# Patient Record
Sex: Female | Born: 1995 | Race: White | Hispanic: No | Smoking: Former smoker
Health system: Southern US, Community
[De-identification: ages and names within clinical notes are randomized; demographics above are authoritative.]

## PROBLEM LIST (undated history)

## (undated) DIAGNOSIS — O24419 Gestational diabetes mellitus in pregnancy, unspecified control: Secondary | ICD-10-CM

## (undated) DIAGNOSIS — R569 Unspecified convulsions: Secondary | ICD-10-CM

## (undated) DIAGNOSIS — N159 Renal tubulo-interstitial disease, unspecified: Secondary | ICD-10-CM

## (undated) DIAGNOSIS — E119 Type 2 diabetes mellitus without complications: Secondary | ICD-10-CM

## (undated) DIAGNOSIS — Z8669 Personal history of other diseases of the nervous system and sense organs: Secondary | ICD-10-CM

## (undated) DIAGNOSIS — I10 Essential (primary) hypertension: Secondary | ICD-10-CM

## (undated) DIAGNOSIS — F419 Anxiety disorder, unspecified: Secondary | ICD-10-CM

## (undated) DIAGNOSIS — F32A Depression, unspecified: Secondary | ICD-10-CM

## (undated) DIAGNOSIS — F329 Major depressive disorder, single episode, unspecified: Secondary | ICD-10-CM

## (undated) HISTORY — PX: MOUTH SURGERY: SHX715

## (undated) HISTORY — PX: MOUTH BIOPSY: SHX1012

## (undated) HISTORY — DX: Personal history of other diseases of the nervous system and sense organs: Z86.69

## (undated) HISTORY — PX: WISDOM TOOTH EXTRACTION: SHX21

---
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
## 2015-01-29 ENCOUNTER — Emergency Department
Admission: EM | Admit: 2015-01-29 | Discharge: 2015-01-29 | Disposition: A | Discharge status: Home / Self Care | Payer: Medicaid Other | Attending: Emergency Medicine | Admitting: Emergency Medicine

## 2015-01-29 ENCOUNTER — Encounter: Payer: Self-pay | Admitting: Urgent Care

## 2015-01-29 DIAGNOSIS — G40909 Epilepsy, unspecified, not intractable, without status epilepticus: Secondary | ICD-10-CM | POA: Diagnosis not present

## 2015-01-29 DIAGNOSIS — N390 Urinary tract infection, site not specified: Secondary | ICD-10-CM

## 2015-01-29 DIAGNOSIS — Z3202 Encounter for pregnancy test, result negative: Secondary | ICD-10-CM | POA: Insufficient documentation

## 2015-01-29 DIAGNOSIS — I10 Essential (primary) hypertension: Secondary | ICD-10-CM | POA: Insufficient documentation

## 2015-01-29 DIAGNOSIS — E876 Hypokalemia: Secondary | ICD-10-CM

## 2015-01-29 DIAGNOSIS — Z720 Tobacco use: Secondary | ICD-10-CM | POA: Insufficient documentation

## 2015-01-29 DIAGNOSIS — R569 Unspecified convulsions: Secondary | ICD-10-CM | POA: Diagnosis present

## 2015-01-29 HISTORY — DX: Essential (primary) hypertension: I10

## 2015-01-29 HISTORY — DX: Unspecified convulsions: R56.9

## 2015-01-29 LAB — HEPATIC FUNCTION PANEL
ALBUMIN: 3.7 g/dL (ref 3.5–5.0)
ALT: 44 U/L (ref 14–54)
AST: 33 U/L (ref 15–41)
Alkaline Phosphatase: 95 U/L (ref 38–126)
BILIRUBIN TOTAL: 0.3 mg/dL (ref 0.3–1.2)
Total Protein: 7.2 g/dL (ref 6.5–8.1)

## 2015-01-29 LAB — BASIC METABOLIC PANEL
Anion gap: 8 (ref 5–15)
BUN: 10 mg/dL (ref 6–20)
CO2: 23 mmol/L (ref 22–32)
Calcium: 9 mg/dL (ref 8.9–10.3)
Chloride: 107 mmol/L (ref 101–111)
Creatinine, Ser: 0.7 mg/dL (ref 0.44–1.00)
GFR calc Af Amer: >60 mL/min (ref >60)
GFR calc non Af Amer: >60 mL/min (ref >60)
Glucose, Bld: 109 mg/dL — ABNORMAL HIGH (ref 65–99)
Potassium: 3 mmol/L — ABNORMAL LOW (ref 3.5–5.1)
Sodium: 138 mmol/L (ref 135–145)

## 2015-01-29 LAB — CBC
HCT: 34.8 % — ABNORMAL LOW (ref 35.0–47.0)
HEMOGLOBIN: 11.2 g/dL — AB (ref 12.0–16.0)
MCH: 26.2 pg (ref 26.0–34.0)
MCHC: 32.2 g/dL (ref 32.0–36.0)
MCV: 81.6 fL (ref 80.0–100.0)
Platelets: 302 10*3/uL (ref 150–440)
RBC: 4.27 MIL/uL (ref 3.80–5.20)
RDW: 15.1 % — AB (ref 11.5–14.5)
WBC: 9.8 10*3/uL (ref 3.6–11.0)

## 2015-01-29 LAB — URINALYSIS COMPLETE WITH MICROSCOPIC (ARMC ONLY)
BILIRUBIN URINE: NEGATIVE
Glucose, UA: NEGATIVE mg/dL
Ketones, ur: NEGATIVE mg/dL
Nitrite: NEGATIVE
Protein, ur: NEGATIVE mg/dL
Specific Gravity, Urine: 1.015 (ref 1.005–1.030)
pH: 7 (ref 5.0–8.0)

## 2015-01-29 LAB — POCT PREGNANCY, URINE: Preg Test, Ur: NEGATIVE

## 2015-01-29 MED ORDER — POTASSIUM CHLORIDE CRYS ER 20 MEQ PO TBCR
EXTENDED_RELEASE_TABLET | ORAL | Status: AC
  Administered 2015-01-29: 40 meq via ORAL
  Filled 2015-01-29: qty 2

## 2015-01-29 MED ORDER — SULFAMETHOXAZOLE-TRIMETHOPRIM 800-160 MG PO TABS
1.0000 | ORAL_TABLET | Freq: Once | ORAL | Status: AC
  Administered 2015-01-29: 1 via ORAL

## 2015-01-29 MED ORDER — POTASSIUM CHLORIDE CRYS ER 20 MEQ PO TBCR
40.0000 meq | EXTENDED_RELEASE_TABLET | Freq: Once | ORAL | Status: AC
  Administered 2015-01-29: 40 meq via ORAL

## 2015-01-29 MED ORDER — SULFAMETHOXAZOLE-TRIMETHOPRIM 800-160 MG PO TABS
ORAL_TABLET | ORAL | Status: AC
  Administered 2015-01-29: 1 via ORAL
  Filled 2015-01-29: qty 1

## 2015-01-29 MED ORDER — SULFAMETHOXAZOLE-TRIMETHOPRIM 800-160 MG PO TABS: 1.0000 | ORAL_TABLET | Freq: Two times a day (BID) | ORAL | Status: DC

## 2015-01-29 NOTE — ED Notes (Signed)
Patient resting in room with NAD noted. Awaiting results at this time. Updated on plan of care as it stands at this point. No verbalized needs. Will continue to monitor.

## 2015-01-29 NOTE — ED Notes (Addendum)
Patient presents via EMS from home. Patient's boyfriend was awakened to patient having a seizure; EMS could not describe activity, patient could not remember, and boyfriend is not available for consult. Patient has had seizures in the past, however the last one was when she was [age] - had Dx made age the age of 89.  FD reported to EMS that patient was post ictal upon their arrival, however patient CAO x 3 en route to the hospital. Patient reports feeling "numb" all over and that she has generalized back pain. CBG 105.

## 2015-01-29 NOTE — ED Provider Notes (Signed)
[facility] Emergency Department Provider Note ____________________________________________  Time seen: Approximately 1:30 AM  I have reviewed the triage vital signs and the nursing notes.  HISTORY  Chief Complaint Seizures  HPI Lori Henry is a 19 y.o. female apparently had a seizure while she was in her bed. The brother sleep beside her stated that she was having tonic-clonic movements, and was very combative after the seizure. Patient is not complaining of any significant injuries because she was in the bed. Patient does though states she has a mild headache and some pain in her mid back but that has been fairly chronic, but she did not bite her tongue. The mother stated that the patient had a seizure when she was around 1 and she did not have another seizure until she was 7 but both appear to be associated with fevers at age 19 she had another seizure that was associated with fever and strep throat. Patient has never been placed on any type of seizure medication. This is the first seizure the patient head that was without a fever. Patient had cough and cold symptoms about 3 weeks ago but has been completely improved from that. Patient states her headache right now is about a 1 and she is otherwise having no other acute symptoms. She denies any significant sore throat and earache neck pain, abdominal pain, dysuria, frequency, or hematuria.   Past Medical History  Diagnosis Date  . Seizures   . Hypertension     There are no active problems to display for this patient.   History reviewed. No pertinent past surgical history.  Current Outpatient Rx  Name  Route  Sig  Dispense  Refill  . sulfamethoxazole-trimethoprim (BACTRIM DS,SEPTRA DS) 800-160 MG per tablet   Oral   Take 1 tablet by mouth 2 (two) times daily.   14 tablet   0     Allergies Review of patient's allergies indicates no known allergies.  No family history on file.  Social  History History  Substance Use Topics  . Smoking status: Current Every Day Smoker  . Smokeless tobacco: Not on file  . Alcohol Use: No    Review of Systems Constitutional: No fever/chills Eyes: No visual changes. ENT: No sore throat. Cardiovascular: Denies chest pain. Respiratory: Denies shortness of breath. Gastrointestinal: No abdominal pain.  No nausea, no vomiting.  No diarrhea.  No constipation. Genitourinary: Negative for dysuria. Musculoskeletal: Patient with pain to her bilateral mid back but it is fairly chronic as well. Patient was laying down which she had the seizure.. Skin: Negative for rash. Neurological: Negative for headaches, focal weakness or numbness. Seizure activity noted by the brother and a period of being combative right after the seizure occurred. Patient with mild headache at this time.  10-point ROS otherwise negative.  ____________________________________________   PHYSICAL EXAM:  VITAL SIGNS: ED Triage Vitals  Enc Vitals Group     BP 01/29/15 0127 141/81 mmHg     Pulse Rate 01/29/15 0127 87     Resp 01/29/15 0127 18     Temp 01/29/15 0127 99 F (37.2 C)     Temp Source 01/29/15 0127 Oral     SpO2 01/29/15 0127 98 %     Weight 01/29/15 0127 220 lb (99.791 kg)     Height 01/29/15 0127 5\' 5"  (1.651 m)     Head Cir --      Peak Flow --      Pain Score 01/29/15 0128 10  Pain Loc --      Pain Edu? --      Excl. in GC? --     Constitutional: Alert and oriented. Well appearing and in no acute distress. Eyes: Conjunctivae are normal. PERRL. EOMI. Head: Atraumatic. No abrasions to the tongue. Nose: No congestion/rhinnorhea. Mouth/Throat: Mucous membranes are moist.  Oropharynx non-erythematous. Neck: No stridor.   Cardiovascular: Normal rate, regular rhythm. Grossly normal heart sounds.  Good peripheral circulation. Respiratory: Normal respiratory effort.  No retractions. Lungs CTAB. Gastrointestinal: Soft and nontender. No distention. No  abdominal bruits. No CVA tenderness. Musculoskeletal: No lower extremity tenderness nor edema.  No joint effusions. Neurologic:  Normal speech and language. No gross focal neurologic deficits are appreciated. Speech is normal. No gait instability. Skin:  Skin is warm, dry and intact. No rash noted. Psychiatric: Mood and affect are normal. Speech and behavior are normal.  ____________________________________________   LABS (all labs ordered are listed, but only abnormal results are displayed)  Labs Reviewed  BASIC METABOLIC PANEL - Abnormal; Notable for the following:    Potassium 3.0 (*)    Glucose, Bld 109 (*)    All other components within normal limits  CBC - Abnormal; Notable for the following:    Hemoglobin 11.2 (*)    HCT 34.8 (*)    RDW 15.1 (*)    All other components within normal limits  URINALYSIS COMPLETEWITH MICROSCOPIC ([facility] ONLY) - Abnormal; Notable for the following:    Color, Urine YELLOW (*)    APPearance HAZY (*)    Hgb urine dipstick 2+ (*)    Leukocytes, UA TRACE (*)    Bacteria, UA MANY (*)    Squamous Epithelial / LPF 0-5 (*)    All other components within normal limits  HEPATIC FUNCTION PANEL - Abnormal; Notable for the following:    Bilirubin, Direct <0.1 (*)    All other components within normal limits  DRUG SCREEN 10 W/CONF, SERUM  POC URINE PREG, ED   ____________________________________________  EKG  None ____________________________________________  RADIOLOGY  No results found.  ____________________________________________   PROCEDURES  Procedure(s) performed: None  Critical Care performed: No  ____________________________________________   INITIAL IMPRESSION / ASSESSMENT AND PLAN / ED COURSE  Pertinent labs & imaging results that were available during my care of the patient were reviewed by me and considered in my medical decision making (see chart for details).  ----------------------------------------- 4:03 AM on  01/29/2015 -----------------------------------------  Patient has been observed and has not had a seizure. Patient was noted from her labs that she has a urinary tract infection as well as her potassium was slightly low. Patient will be given a dose of potassium as well as she'll be started on Septra DS for her UTI. Patient is going to be referred to follow-up with neurology for further workup. ____________________________________________   FINAL CLINICAL IMPRESSION(S) / ED DIAGNOSES  Final diagnoses:  Seizure disorder  UTI (lower urinary tract infection)  Hypokalemia      Leona Carry, MD 01/29/15 [id number]

## 2015-01-31 LAB — DRUG SCREEN 10 W/CONF, SERUM
Amphetamines, IA: NEGATIVE ng/mL
Barbiturates, IA: NEGATIVE ug/mL
Benzodiazepines, IA: NEGATIVE ng/mL
Cocaine & Metabolite, IA: NEGATIVE ng/mL
METHADONE, IA: NEGATIVE ng/mL
OPIATES, IA: NEGATIVE ng/mL
Oxycodones, IA: NEGATIVE ng/mL
Phencyclidine, IA: NEGATIVE ng/mL
Propoxyphene, IA: NEGATIVE ng/mL
THC(Marijuana) Metabolite, IA: NEGATIVE ng/mL

## 2015-07-05 ENCOUNTER — Ambulatory Visit
Admission: EM | Admit: 2015-07-05 | Discharge: 2015-07-05 | Disposition: A | Discharge status: Home / Self Care | Payer: Medicaid Other | Attending: Family Medicine | Admitting: Family Medicine

## 2015-07-05 ENCOUNTER — Encounter: Payer: Self-pay | Admitting: *Deleted

## 2015-07-05 DIAGNOSIS — R0981 Nasal congestion: Secondary | ICD-10-CM | POA: Insufficient documentation

## 2015-07-05 DIAGNOSIS — J0100 Acute maxillary sinusitis, unspecified: Secondary | ICD-10-CM | POA: Diagnosis not present

## 2015-07-05 DIAGNOSIS — N390 Urinary tract infection, site not specified: Secondary | ICD-10-CM | POA: Diagnosis not present

## 2015-07-05 DIAGNOSIS — J029 Acute pharyngitis, unspecified: Secondary | ICD-10-CM | POA: Diagnosis not present

## 2015-07-05 DIAGNOSIS — R05 Cough: Secondary | ICD-10-CM | POA: Insufficient documentation

## 2015-07-05 HISTORY — DX: Depression, unspecified: F32.A

## 2015-07-05 HISTORY — DX: Anxiety disorder, unspecified: F41.9

## 2015-07-05 HISTORY — DX: Major depressive disorder, single episode, unspecified: F32.9

## 2015-07-05 LAB — URINALYSIS COMPLETE WITH MICROSCOPIC (ARMC ONLY)
Bilirubin Urine: NEGATIVE
Glucose, UA: NEGATIVE mg/dL
HGB URINE DIPSTICK: NEGATIVE
Ketones, ur: NEGATIVE mg/dL
NITRITE: NEGATIVE
PH: 6 (ref 5.0–8.0)
PROTEIN: NEGATIVE mg/dL
Specific Gravity, Urine: 1.025 (ref 1.005–1.030)

## 2015-07-05 LAB — PREGNANCY, URINE: PREG TEST UR: NEGATIVE

## 2015-07-05 MED ORDER — AMOXICILLIN-POT CLAVULANATE 875-125 MG PO TABS: 1.0000 | ORAL_TABLET | Freq: Two times a day (BID) | ORAL | Status: DC

## 2015-07-05 NOTE — ED Provider Notes (Signed)
[facility]  ____________________________________________  Time seen: Approximately 6:34 PM  I have reviewed the triage vital signs and the nursing notes.   HISTORY  Chief Complaint Nasal Congestion; Cough; and Sore Throat   HPI Lori Henry is a 19 y.o. female presents with complaint of one week of runny nose, nasal congestion and purulent green nasal drainage. Patient reports that she frequently is blowing her nose and getting thick greenish drainage out. Also reports that she has occasionally noticed a slight peak area of mucus in her nasal drainage after blowing her nose that appeared to be blood. Denies blood clots from nose, nosebleed or other bleeding. Patient reports she also does have some pressure in her face around her cheeks described as a aching at 3 out of 10. Reports occasional intermittent cough.  Denies sore throat. Reports continues eat and drink well. Denies chest pain, shortness of breath, abdominal pain, neck pain or back pain. Patient reports yesterday he noticed that her urine possibly had a odor to it and states that she wants to have her urine tested for urinary tract infection. Denies urinary frequency, urgency, dysuria, vaginal discharge, vaginal odor or vaginal complaints. Reports otherwise continues to urinate and move bowels well.  Patient reports that she follows with her primary care physician on a regular basis Dr. Gavin PottersGrandis. Patient reports that she had IUD in place in May of this year and states that it fell out in June. Patient reports that she has not yet had a regular menstrual cycle since then. Patient reports that her primary care physician is following this.   Past Medical History  Diagnosis Date  . Seizures (HCC)   . Hypertension   . Anxiety   . Depression     There are no active problems to display for this patient.   Past Surgical History  Procedure Laterality Date  . Wisdom tooth extraction      Current Outpatient Rx   Name  Route  Sig  Dispense  Refill  . citalopram (CELEXA) 20 MG tablet   Oral   Take 20 mg by mouth daily.         Marland Kitchen. lisinopril (PRINIVIL,ZESTRIL) 5 MG tablet   Oral   Take 5 mg by mouth daily.         .             Allergies Bee venom  History reviewed. No pertinent family history.  Social History Social History  Substance Use Topics  . Smoking status: Current Every Day Smoker  . Smokeless tobacco: Never Used  . Alcohol Use: No    Review of Systems Constitutional: No fever/chills Eyes: No visual changes. ENT: No sore throat. Positive runny nose, nasal congestion, sinus pressure. Reports occasional cough. Cardiovascular: Denies chest pain. Respiratory: Denies shortness of breath. Gastrointestinal: No abdominal pain.  No nausea, no vomiting.  No diarrhea.  No constipation. Genitourinary: Negative for dysuria. Musculoskeletal: Negative for back pain. Skin: Negative for rash. Neurological: Negative for headaches, focal weakness or numbness.  10-point ROS otherwise negative.  ____________________________________________   PHYSICAL EXAM:  VITAL SIGNS: ED Triage Vitals  Enc Vitals Group     BP 07/05/15 1723 113/72 mmHg     Pulse Rate 07/05/15 1723 71     Resp 07/05/15 1723 18     Temp 07/05/15 1723 98.1 F (36.7 C)     Temp Source 07/05/15 1723 Oral     SpO2 07/05/15 1723 100 %     Weight 07/05/15  1723 230 lb (104.327 kg)     Height 07/05/15 1723  (1.626 m)     Head Cir --      Peak Flow --      Pain Score --      Pain Loc --      Pain Edu? --      Excl. in GC? --     Constitutional: Alert and oriented. Well appearing and in no acute distress. Eyes: Conjunctivae are normal. PERRL. EOMI. Head: Atraumatic. Moderate tenderness to palpation bilateral maxillary sinuses. Nontender bilateral frontal sinuses. No swelling. No erythema.  Ears: no erythema, normal TMs bilaterally.   Nose: Nasal congestion. Nasal turbinate erythema. No active bleeding. no  dried blood present. No signs of recent bleeding.  Mouth/Throat: Mucous membranes are moist.  Oropharynx non-erythematous. No tonsillar swelling or exudate. Neck: No stridor.  No cervical spine tenderness to palpation. Hematological/Lymphatic/Immunilogical: No cervical lymphadenopathy. Cardiovascular: Normal rate, regular rhythm. Grossly normal heart sounds.  Good peripheral circulation. Respiratory: Normal respiratory effort.  No retractions. Lungs CTAB. No wheezes, rales or rhonchi. Good air movement. Gastrointestinal: Soft and nontender. Obese abdomen Normal Bowel sounds.  No CVA tenderness. Musculoskeletal: No lower or upper extremity tenderness nor edema. Bilateral pedal pulses equal and easily palpated.  Neurologic:  Normal speech and language. No gross focal neurologic deficits are appreciated. No gait instability. Skin:  Skin is warm, dry and intact. No rash noted. Psychiatric: Mood and affect are normal. Speech and behavior are normal.  ____________________________________________   LABS (all labs ordered are listed, but only abnormal results are displayed)  Labs Reviewed  URINALYSIS COMPLETEWITH MICROSCOPIC ([facility] ONLY) - Abnormal; Notable for the following:    Leukocytes, UA TRACE (*)    Bacteria, UA MANY (*)    Squamous Epithelial / LPF 0-5 (*)    All other components within normal limits  URINE CULTURE  PREGNANCY, URINE     INITIAL IMPRESSION / ASSESSMENT AND PLAN / ED COURSE  Pertinent labs & imaging results that were available during my care of the patient were reviewed by me and considered in my medical decision making (see chart for details).  Very well-appearing patient. No acute distress. Presents for complaints of 1 week of runny nose, nasal congestion, sinus pressure and purulent green nasal drainage. Lungs clear throughout. Abdomen soft and nontender. Suspect maxillary sinusitis.Will also evaluate urine.   Urinalysis positive for many bacteria, trace  leukocytes, will culture urine. Urine pregnancy negative. Will treat urinary tract infection and sinusitis with oral Augmentin, and culture urine to ensure sensitivity. Encourage supportive treatments including fluids, rest, and PCP follow up.   Discussed follow up with Primary care physician this week. Discussed follow up and return parameters including no resolution or any worsening concerns. Patient verbalized understanding and agreed to plan.   ____________________________________________   FINAL CLINICAL IMPRESSION(S) / ED DIAGNOSES  Final diagnoses:  UTI (lower urinary tract infection)  Acute maxillary sinusitis, recurrence not specified       Renford Dills, NP 07/05/15 2158

## 2015-07-05 NOTE — Discharge Instructions (Signed)
Take medication as prescribed. Rest. Drink plenty of fluids. Take over-the-counter Tylenol as needed.  Follow-up closely with primary care physician. Follow-up next week to ensure resolution of urinary tract infection.  Return to urgent care as needed for new or worsening concerns.  Sinusitis, Adult Sinusitis is redness, soreness, and inflammation of the paranasal sinuses. Paranasal sinuses are air pockets within the bones of your face. They are located beneath your eyes, in the middle of your forehead, and above your eyes. In healthy paranasal sinuses, mucus is able to drain out, and air is able to circulate through them by way of your nose. However, when your paranasal sinuses are inflamed, mucus and air can become trapped. This can allow bacteria and other germs to grow and cause infection. Sinusitis can develop quickly and last only a short time (acute) or continue over a long period (chronic). Sinusitis that lasts for more than 12 weeks is considered chronic. CAUSES Causes of sinusitis include:  Allergies.  Structural abnormalities, such as displacement of the cartilage that separates your nostrils (deviated septum), which can decrease the air flow through your nose and sinuses and affect sinus drainage.  Functional abnormalities, such as when the small hairs (cilia) that line your sinuses and help remove mucus do not work properly or are not present. SIGNS AND SYMPTOMS Symptoms of acute and chronic sinusitis are the same. The primary symptoms are pain and pressure around the affected sinuses. Other symptoms include:  Upper toothache.  Earache.  Headache.  Bad breath.  Decreased sense of smell and taste.  A cough, which worsens when you are lying flat.  Fatigue.  Fever.  Thick drainage from your nose, which often is green and may contain pus (purulent).  Swelling and warmth over the affected sinuses. DIAGNOSIS Your health care provider will perform a physical exam. During  your exam, your health care provider may perform any of the following to help determine if you have acute sinusitis or chronic sinusitis:  Look in your nose for signs of abnormal growths in your nostrils (nasal polyps).  Tap over the affected sinus to check for signs of infection.  View the inside of your sinuses using an imaging device that has a light attached (endoscope). If your health care provider suspects that you have chronic sinusitis, one or more of the following tests may be recommended:  Allergy tests.  Nasal culture. A sample of mucus is taken from your nose, sent to a lab, and screened for bacteria.  Nasal cytology. A sample of mucus is taken from your nose and examined by your health care provider to determine if your sinusitis is related to an allergy. TREATMENT Most cases of acute sinusitis are related to a viral infection and will resolve on their own within 10 days. Sometimes, medicines are prescribed to help relieve symptoms of both acute and chronic sinusitis. These may include pain medicines, decongestants, nasal steroid sprays, or saline sprays. However, for sinusitis related to a bacterial infection, your health care provider will prescribe antibiotic medicines. These are medicines that will help kill the bacteria causing the infection. Rarely, sinusitis is caused by a fungal infection. In these cases, your health care provider will prescribe antifungal medicine. For some cases of chronic sinusitis, surgery is needed. Generally, these are cases in which sinusitis recurs more than 3 times per year, despite other treatments. HOME CARE INSTRUCTIONS  Drink plenty of water. Water helps thin the mucus so your sinuses can drain more easily.  Use a humidifier.  Inhale steam 3-4 times a day (for example, sit in the bathroom with the shower running).  Apply a warm, moist washcloth to your face 3-4 times a day, or as directed by your health care provider.  Use saline nasal  sprays to help moisten and clean your sinuses.  Take medicines only as directed by your health care provider.  If you were prescribed either an antibiotic or antifungal medicine, finish it all even if you start to feel better. SEEK IMMEDIATE MEDICAL CARE IF:  You have increasing pain or severe headaches.  You have nausea, vomiting, or drowsiness.  You have swelling around your face.  You have vision problems.  You have a stiff neck.  You have difficulty breathing.   This information is not intended to replace advice given to you by your health care provider. Make sure you discuss any questions you have with your health care provider.   Document Released: 07/20/2005 Document Revised: 08/10/2014 Document Reviewed: 08/04/2011 Elsevier Interactive Patient Education 2016 [employer] Inc.  Urinary Tract Infection Urinary tract infections (UTIs) can develop anywhere along your urinary tract. Your urinary tract is your body's drainage system for removing wastes and extra water. Your urinary tract includes two kidneys, two ureters, a bladder, and a urethra. Your kidneys are a pair of bean-shaped organs. Each kidney is about the size of your fist. They are located below your ribs, one on each side of your spine. CAUSES Infections are caused by microbes, which are microscopic organisms, including fungi, viruses, and bacteria. These organisms are so small that they can only be seen through a microscope. Bacteria are the microbes that most commonly cause UTIs. SYMPTOMS  Symptoms of UTIs may vary by age and gender of the patient and by the location of the infection. Symptoms in young women typically include a frequent and intense urge to urinate and a painful, burning feeling in the bladder or urethra during urination. Older women and men are more likely to be tired, shaky, and weak and have muscle aches and abdominal pain. A fever may mean the infection is in your kidneys. Other symptoms of a kidney  infection include pain in your back or sides below the ribs, nausea, and vomiting. DIAGNOSIS To diagnose a UTI, your caregiver will ask you about your symptoms. Your caregiver will also ask you to provide a urine sample. The urine sample will be tested for bacteria and white blood cells. White blood cells are made by your body to help fight infection. TREATMENT  Typically, UTIs can be treated with medication. Because most UTIs are caused by a bacterial infection, they usually can be treated with the use of antibiotics. The choice of antibiotic and length of treatment depend on your symptoms and the type of bacteria causing your infection. HOME CARE INSTRUCTIONS  If you were prescribed antibiotics, take them exactly as your caregiver instructs you. Finish the medication even if you feel better after you have only taken some of the medication.  Drink enough water and fluids to keep your urine clear or pale yellow.  Avoid caffeine, tea, and carbonated beverages. They tend to irritate your bladder.  Empty your bladder often. Avoid holding urine for long periods of time.  Empty your bladder before and after sexual intercourse.  After a bowel movement, women should cleanse from front to back. Use each tissue only once. SEEK MEDICAL CARE IF:   You have back pain.  You develop a fever.  Your symptoms do not begin to resolve within  3 days. SEEK IMMEDIATE MEDICAL CARE IF:   You have severe back pain or lower abdominal pain.  You develop chills.  You have nausea or vomiting.  You have continued burning or discomfort with urination. MAKE SURE YOU:   Understand these instructions.  Will watch your condition.  Will get help right away if you are not doing well or get worse.   This information is not intended to replace advice given to you by your health care provider. Make sure you discuss any questions you have with your health care provider.   Document Released: 04/29/2005 Document  Revised: 04/10/2015 Document Reviewed: 08/28/2011 Elsevier Interactive Patient Education [employer] Inc[employer]

## 2015-07-05 NOTE — ED Notes (Signed)
Patient started having symptoms of nasal congestion this past Monday and the symptoms have progressed to sore throat and a cough. Patient states her mucus in bloody.

## 2015-07-10 LAB — URINE CULTURE
Culture: 10000
Special Requests: NORMAL

## 2015-09-06 ENCOUNTER — Emergency Department
Admission: EM | Admit: 2015-09-06 | Discharge: 2015-09-06 | Disposition: A | Discharge status: Home / Self Care | Payer: Medicaid Other | Attending: Emergency Medicine | Admitting: Emergency Medicine

## 2015-09-06 ENCOUNTER — Encounter: Payer: Self-pay | Admitting: Emergency Medicine

## 2015-09-06 DIAGNOSIS — I10 Essential (primary) hypertension: Secondary | ICD-10-CM | POA: Diagnosis not present

## 2015-09-06 DIAGNOSIS — Z79899 Other long term (current) drug therapy: Secondary | ICD-10-CM | POA: Insufficient documentation

## 2015-09-06 DIAGNOSIS — F17200 Nicotine dependence, unspecified, uncomplicated: Secondary | ICD-10-CM | POA: Insufficient documentation

## 2015-09-06 DIAGNOSIS — R109 Unspecified abdominal pain: Secondary | ICD-10-CM | POA: Insufficient documentation

## 2015-09-06 DIAGNOSIS — M545 Low back pain, unspecified: Secondary | ICD-10-CM

## 2015-09-06 DIAGNOSIS — Z3202 Encounter for pregnancy test, result negative: Secondary | ICD-10-CM | POA: Diagnosis not present

## 2015-09-06 LAB — COMPREHENSIVE METABOLIC PANEL
ALBUMIN: 3.8 g/dL (ref 3.5–5.0)
ALT: 41 U/L (ref 14–54)
AST: 24 U/L (ref 15–41)
Alkaline Phosphatase: 94 U/L (ref 38–126)
Anion gap: 6 (ref 5–15)
BUN: 12 mg/dL (ref 6–20)
CHLORIDE: 104 mmol/L (ref 101–111)
CO2: 26 mmol/L (ref 22–32)
Calcium: 9.2 mg/dL (ref 8.9–10.3)
Creatinine, Ser: 0.44 mg/dL (ref 0.44–1.00)
GFR calc Af Amer: >60 mL/min (ref >60)
Glucose, Bld: 119 mg/dL — ABNORMAL HIGH (ref 65–99)
POTASSIUM: 3.8 mmol/L (ref 3.5–5.1)
Sodium: 136 mmol/L (ref 135–145)
Total Bilirubin: 0.4 mg/dL (ref 0.3–1.2)
Total Protein: 7.7 g/dL (ref 6.5–8.1)

## 2015-09-06 LAB — URINALYSIS COMPLETE WITH MICROSCOPIC (ARMC ONLY)
Bilirubin Urine: NEGATIVE
Glucose, UA: NEGATIVE mg/dL
HGB URINE DIPSTICK: NEGATIVE
Ketones, ur: NEGATIVE mg/dL
Nitrite: NEGATIVE
PH: 6 (ref 5.0–8.0)
Protein, ur: NEGATIVE mg/dL
Specific Gravity, Urine: 1.006 (ref 1.005–1.030)

## 2015-09-06 LAB — CBC WITH DIFFERENTIAL/PLATELET
BASOS PCT: 0 %
Basophils Absolute: 0 10*3/uL (ref 0–0.1)
Eosinophils Absolute: 0.2 10*3/uL (ref 0–0.7)
Eosinophils Relative: 2 %
HEMATOCRIT: 33.6 % — AB (ref 35.0–47.0)
HEMOGLOBIN: 10.8 g/dL — AB (ref 12.0–16.0)
LYMPHS ABS: 2.6 10*3/uL (ref 1.0–3.6)
LYMPHS PCT: 26 %
MCH: 24.9 pg — ABNORMAL LOW (ref 26.0–34.0)
MCHC: 32 g/dL (ref 32.0–36.0)
MCV: 77.8 fL — ABNORMAL LOW (ref 80.0–100.0)
Monocytes Absolute: 0.8 10*3/uL (ref 0.2–0.9)
Monocytes Relative: 8 %
NEUTROS PCT: 64 %
Neutro Abs: 6.3 10*3/uL (ref 1.4–6.5)
PLATELETS: 305 10*3/uL (ref 150–440)
RBC: 4.32 MIL/uL (ref 3.80–5.20)
RDW: 14.8 % — AB (ref 11.5–14.5)
WBC: 9.9 10*3/uL (ref 3.6–11.0)

## 2015-09-06 LAB — PREGNANCY, URINE: Preg Test, Ur: NEGATIVE

## 2015-09-06 MED ORDER — CYCLOBENZAPRINE HCL 10 MG PO TABS
ORAL_TABLET | ORAL | Status: AC
  Filled 2015-09-06: qty 1

## 2015-09-06 MED ORDER — NAPROXEN 500 MG PO TABS: 500.0000 mg | ORAL_TABLET | Freq: Two times a day (BID) | ORAL | Status: DC

## 2015-09-06 MED ORDER — CYCLOBENZAPRINE HCL 10 MG PO TABS: 10.0000 mg | ORAL_TABLET | Freq: Three times a day (TID) | ORAL | Status: DC | PRN

## 2015-09-06 MED ORDER — CYCLOBENZAPRINE HCL 10 MG PO TABS
5.0000 mg | ORAL_TABLET | Freq: Once | ORAL | Status: AC
  Administered 2015-09-06: 5 mg via ORAL

## 2015-09-06 MED ORDER — NAPROXEN 500 MG PO TABS
ORAL_TABLET | ORAL | Status: DC
Start: 2015-09-06 — End: 2015-09-07
  Filled 2015-09-06: qty 1

## 2015-09-06 MED ORDER — NAPROXEN 500 MG PO TABS
500.0000 mg | ORAL_TABLET | Freq: Once | ORAL | Status: AC
Start: 2015-09-06 — End: 2015-09-06
  Administered 2015-09-06: 500 mg via ORAL

## 2015-09-06 NOTE — ED Provider Notes (Signed)
[facility] Emergency Department Provider Note  ____________________________________________  Time seen: Approximately 10:09 PM  I have reviewed the triage vital signs and the nursing notes.   HISTORY  Chief Complaint Back Pain    HPI Lori Henry is a 20 y.o. female with 2 days of right lower back pain. Nonradiating. Occasional nausea and vomiting, though none in the emergency room. She denies fevers and chills. No dysuria. She reports regular bowel movements. No known injury.She has irregular menses. Her last was 2 weeks ago.   Past Medical History  Diagnosis Date  . Seizures (HCC)   . Hypertension   . Anxiety   . Depression     There are no active problems to display for this patient.   Past Surgical History  Procedure Laterality Date  . Wisdom tooth extraction      Current Outpatient Rx  Name  Route  Sig  Dispense  Refill  . citalopram (CELEXA) 20 MG tablet   Oral   Take 20 mg by mouth daily.         . lisinopril (PRINIVIL,ZESTRIL) 5 MG tablet   Oral   Take 5 mg by mouth daily.         . venlafaxine (EFFEXOR) 50 MG tablet   Oral   Take 50 mg by mouth daily.         . cyclobenzaprine (FLEXERIL) 10 MG tablet   Oral   Take 1 tablet (10 mg total) by mouth every 8 (eight) hours as needed for muscle spasms.   21 tablet   0   . naproxen (NAPROSYN) 500 MG tablet   Oral   Take 1 tablet (500 mg total) by mouth 2 (two) times daily with a meal.   30 tablet   0     Allergies Bee venom  History reviewed. No pertinent family history.  Social History Social History  Substance Use Topics  . Smoking status: Current Every Day Smoker  . Smokeless tobacco: Never Used  . Alcohol Use: No    Review of Systems Constitutional: No fever/chills Eyes: No visual changes. ENT: No sore throat. Cardiovascular: Denies chest pain. Respiratory: Denies shortness of breath. Gastrointestinal: No abdominal pain.  No nausea, no  vomiting.  No diarrhea.  No constipation. Genitourinary: Negative for dysuria. Musculoskeletal:per  HPI. Skin: Negative for rash. Neurological: Negative for headaches, focal weakness or numbness. 10-point ROS otherwise negative.  ____________________________________________   PHYSICAL EXAM:  VITAL SIGNS: ED Triage Vitals  Enc Vitals Group     BP 09/06/15 2158 142/81 mmHg     Pulse Rate 09/06/15 2158 85     Resp 09/06/15 2158 18     Temp 09/06/15 2158 98.1 F (36.7 C)     Temp Source 09/06/15 2158 Oral     SpO2 09/06/15 2158 100 %     Weight 09/06/15 2158 238 lb (107.956 kg)     Height 09/06/15 2158 5' 5" (1.651 m)     Head Cir --      Peak Flow --      Pain Score 09/06/15 2202 10     Pain Loc --      Pain Edu? --      Excl. in GC? --     Constitutional: Alert and oriented. Well appearing and in no acute distress. Eyes: Conjunctivae are normal. PERRL. EOMI. Ears:  Clear with normal landmarks. No erythema. Head: Atraumatic. Nose: No congestion/rhinnorhea. Mouth/Throat: Mucous membranes are moist.  Oropharynx non-erythematous. No lesions.   Neck:  Supple.  No adenopathy.   Cardiovascular: Normal rate, regular rhythm. Grossly normal heart sounds.  Good peripheral circulation. Respiratory: Normal respiratory effort.  No retractions. Lungs CTAB. Gastrointestinal: Soft and MILD  General tenderness. No distention. No abdominal bruits. No CVA tenderness. Musculoskeletal: Nml ROM of upper and lower extremity joints. Neurologic:  Normal speech and language. No gross focal neurologic deficits are appreciated. No gait instability. Skin:  Skin is warm, dry and intact. No rash noted. Psychiatric: Mood and affect are normal. Speech and behavior are normal.  ____________________________________________   LABS (all labs ordered are listed, but only abnormal results are displayed)  Labs Reviewed  URINALYSIS COMPLETEWITH MICROSCOPIC ([facility] ONLY) - Abnormal; Notable for the following:     Color, Urine STRAW (*)    APPearance HAZY (*)    Leukocytes, UA TRACE (*)    Bacteria, UA RARE (*)    Squamous Epithelial / LPF 6-30 (*)    All other components within normal limits  COMPREHENSIVE METABOLIC PANEL - Abnormal; Notable for the following:    Glucose, Bld 119 (*)    All other components within normal limits  CBC WITH DIFFERENTIAL/PLATELET - Abnormal; Notable for the following:    Hemoglobin 10.8 (*)    HCT 33.6 (*)    MCV 77.8 (*)    MCH 24.9 (*)    RDW 14.8 (*)    All other components within normal limits  PREGNANCY, URINE   ____________________________________________  EKG   ____________________________________________  RADIOLOGY   ____________________________________________   PROCEDURES  Procedure(s) performed: None  Critical Care performed: No  ____________________________________________   INITIAL IMPRESSION / ASSESSMENT AND PLAN / ED COURSE  Pertinent labs & imaging results that were available during my care of the patient were reviewed by me and considered in my medical decision making (see chart for details).  20-year-old female who presents with several days of right lower back pain. Urine, CBC, met C are within normal limits. Negative pregnancy test. Symptoms suggest back strain. She has some mild abdominal pain on exam and consider constipation. Encouraged mild laxative. Also given naproxen and Flexeril for pain control. She can follow-up with her primary physician if not improving or return to the emergency room for any concerns. ____________________________________________   FINAL CLINICAL IMPRESSION(S) / ED DIAGNOSES  Final diagnoses:  Right-sided low back pain without sciatica      Robert Tumey, PA-C 09/06/15 2339  Robert Tumey, PA-C 10/03/15 1503  Paul F Malinda, MD 10/03/15 2332 

## 2015-09-06 NOTE — ED Notes (Signed)
Pt states low to mid back pain for "days" with associated nausea. Pt denies shob, fever, nausea, vomiting, dysuria, hematuria.

## 2015-09-06 NOTE — Discharge Instructions (Signed)
Back Pain, Adult Back pain is very common. The pain often gets better over time. The cause of back pain is usually not dangerous. Most people can learn to manage their back pain on their own.  HOME CARE  Watch your back pain for any changes. The following actions may help to lessen any pain you are feeling: 1. Stay active. Start with short walks on flat ground if you can. Try to walk farther each day. 2. Exercise regularly as told by your doctor. Exercise helps your back heal faster. It also helps avoid future injury by keeping your muscles strong and flexible. 3. Do not sit, drive, or stand in one place for more than 30 minutes. 4. Do not stay in bed. Resting more than 1-2 days can slow down your recovery. 5. Be careful when you bend or lift an object. Use good form when lifting: 1. Bend at your knees. 2. Keep the object close to your body. 3. Do not twist. 6. Sleep on a firm mattress. Lie on your side, and bend your knees. If you lie on your back, put a pillow under your knees. 7. Take medicines only as told by your doctor. 8. Put ice on the injured area. 1. Put ice in a plastic bag. 2. Place a towel between your skin and the bag. 3. Leave the ice on for 20 minutes, 2-3 times a day for the first 2-3 days. After that, you can switch between ice and heat packs. 9. Avoid feeling anxious or stressed. Find good ways to deal with stress, such as exercise. 10. Maintain a healthy weight. Extra weight puts stress on your back. GET HELP IF:  1. You have pain that does not go away with rest or medicine. 2. You have worsening pain that goes down into your legs or buttocks. 3. You have pain that does not get better in one week. 4. You have pain at night. 5. You lose weight. 6. You have a fever or chills. GET HELP RIGHT AWAY IF:  1. You cannot control when you poop (bowel movement) or pee (urinate). 2. Your arms or legs feel weak. 3. Your arms or legs lose feeling (numbness). 4. You feel sick to  your stomach (nauseous) or throw up (vomit). 5. You have belly (abdominal) pain. 6. You feel like you may pass out (faint).   This information is not intended to replace advice given to you by your health care provider. Make sure you discuss any questions you have with your health care provider.   Document Released: 01/06/2008 Document Revised: 08/10/2014 Document Reviewed: 11/21/2013 Elsevier Interactive Patient Education 2016 [employer]  Back Exercises The following exercises strengthen the muscles that help to support the back. They also help to keep the lower back flexible. Doing these exercises can help to prevent back pain or lessen existing pain. If you have back pain or discomfort, try doing these exercises 2-3 times each day or as told by your health care provider. When the pain goes away, do them once each day, but increase the number of times that you repeat the steps for each exercise (do more repetitions). If you do not have back pain or discomfort, do these exercises once each day or as told by your health care provider. EXERCISES Single Knee to Chest Repeat these steps 3-5 times for each leg: 11. Lie on your back on a firm bed or the floor with your legs extended. 12. Bring one knee to your chest. Your other leg  should stay extended and in contact with the floor. 13. Hold your knee in place by grabbing your knee or thigh. 14. Pull on your knee until you feel a gentle stretch in your lower back. 15. Hold the stretch for 10-30 seconds. 16. Slowly release and straighten your leg. Pelvic Tilt Repeat these steps 5-10 times: 7. Lie on your back on a firm bed or the floor with your legs extended. 8. Bend your knees so they are pointing toward the ceiling and your feet are flat on the floor. 9. Tighten your lower abdominal muscles to press your lower back against the floor. This motion will tilt your pelvis so your tailbone points up toward the ceiling instead of pointing to your  feet or the floor. 10. With gentle tension and even breathing, hold this position for 5-10 seconds. Cat-Cow Repeat these steps until your lower back becomes more flexible: 7. Get into a hands-and-knees position on a firm surface. Keep your hands under your shoulders, and keep your knees under your hips. You may place padding under your knees for comfort. 8. Let your head hang down, and point your tailbone toward the floor so your lower back becomes rounded like the back of a cat. 9. Hold this position for 5 seconds. 10. Slowly lift your head and point your tailbone up toward the ceiling so your back forms a sagging arch like the back of a cow. 11. Hold this position for 5 seconds. Press-Ups Repeat these steps 5-10 times: 1. Lie on your abdomen (face-down) on the floor. 2. Place your palms near your head, about shoulder-width apart. 3. While you keep your back as relaxed as possible and keep your hips on the floor, slowly straighten your arms to raise the top half of your body and lift your shoulders. Do not use your back muscles to raise your upper torso. You may adjust the placement of your hands to make yourself more comfortable. 4. Hold this position for 5 seconds while you keep your back relaxed. 5. Slowly return to lying flat on the floor. Bridges Repeat these steps 10 times: 1. Lie on your back on a firm surface. 2. Bend your knees so they are pointing toward the ceiling and your feet are flat on the floor. 3. Tighten your buttocks muscles and lift your buttocks off of the floor until your waist is at almost the same height as your knees. You should feel the muscles working in your buttocks and the back of your thighs. If you do not feel these muscles, slide your feet 1-2 inches farther away from your buttocks. 4. Hold this position for 3-5 seconds. 5. Slowly lower your hips to the starting position, and allow your buttocks muscles to relax completely. If this exercise is too easy, try  doing it with your arms crossed over your chest. Abdominal Crunches Repeat these steps 5-10 times: 1. Lie on your back on a firm bed or the floor with your legs extended. 2. Bend your knees so they are pointing toward the ceiling and your feet are flat on the floor. 3. Cross your arms over your chest. 4. Tip your chin slightly toward your chest without bending your neck. 5. Tighten your abdominal muscles and slowly raise your trunk (torso) high enough to lift your shoulder blades a tiny bit off of the floor. Avoid raising your torso higher than that, because it can put too much stress on your low back and it does not help to strengthen your abdominal  muscles. 6. Slowly return to your starting position. Back Lifts Repeat these steps 5-10 times: 1. Lie on your abdomen (face-down) with your arms at your sides, and rest your forehead on the floor. 2. Tighten the muscles in your legs and your buttocks. 3. Slowly lift your chest off of the floor while you keep your hips pressed to the floor. Keep the back of your head in line with the curve in your back. Your eyes should be looking at the floor. 4. Hold this position for 3-5 seconds. 5. Slowly return to your starting position. SEEK MEDICAL CARE IF:  Your back pain or discomfort gets much worse when you do an exercise.  Your back pain or discomfort does not lessen within 2 hours after you exercise. If you have any of these problems, stop doing these exercises right away. Do not do them again unless your health care provider says that you can. SEEK IMMEDIATE MEDICAL CARE IF:  You develop sudden, severe back pain. If this happens, stop doing the exercises right away. Do not do them again unless your health care provider says that you can.   This information is not intended to replace advice given to you by your health care provider. Make sure you discuss any questions you have with your health care provider.   Document Released: 08/27/2004  Document Revised: 04/10/2015 Document Reviewed: 09/13/2014 Elsevier Interactive Patient Education 2016 [employer].   Take pain medication as directed. Begin exercises as your pain improves. Make sure your bowels are moving regularly. Return to the emergency room for any concerns. You may follow-up with urgent care for persistent pain.

## 2015-10-22 ENCOUNTER — Encounter: Payer: Self-pay | Admitting: Emergency Medicine

## 2015-10-22 DIAGNOSIS — Z79899 Other long term (current) drug therapy: Secondary | ICD-10-CM | POA: Insufficient documentation

## 2015-10-22 DIAGNOSIS — H538 Other visual disturbances: Secondary | ICD-10-CM | POA: Diagnosis not present

## 2015-10-22 DIAGNOSIS — R51 Headache: Secondary | ICD-10-CM | POA: Insufficient documentation

## 2015-10-22 DIAGNOSIS — I10 Essential (primary) hypertension: Secondary | ICD-10-CM | POA: Insufficient documentation

## 2015-10-22 DIAGNOSIS — Z87891 Personal history of nicotine dependence: Secondary | ICD-10-CM | POA: Diagnosis not present

## 2015-10-22 NOTE — ED Notes (Signed)
Pt presents to ED with sudden onset of blurry vision in both of her eyes tonight around 2040. Pt states this has happened before when her blood pressure was elevated but tonight she is normotensive. Pt states she developed a headache shortly after her symptoms started. Denies n/v/d. Denies sensitivity to light and sounds. Denies hx of migraine headaches.

## 2015-10-23 ENCOUNTER — Emergency Department: Payer: Medicaid Other

## 2015-10-23 ENCOUNTER — Emergency Department
Admission: EM | Admit: 2015-10-23 | Discharge: 2015-10-23 | Disposition: A | Discharge status: Home / Self Care | Payer: Medicaid Other | Attending: Emergency Medicine | Admitting: Emergency Medicine

## 2015-10-23 DIAGNOSIS — R51 Headache: Secondary | ICD-10-CM

## 2015-10-23 DIAGNOSIS — R519 Headache, unspecified: Secondary | ICD-10-CM

## 2015-10-23 LAB — GLUCOSE, CAPILLARY: GLUCOSE-CAPILLARY: 123 mg/dL — AB (ref 65–99)

## 2015-10-23 MED ORDER — KETOROLAC TROMETHAMINE 10 MG PO TABS: 10.0000 mg | ORAL_TABLET | Freq: Three times a day (TID) | ORAL | Status: DC | PRN

## 2015-10-23 MED ORDER — OXYCODONE-ACETAMINOPHEN 5-325 MG PO TABS
1.0000 | ORAL_TABLET | Freq: Once | ORAL | Status: AC
  Administered 2015-10-23: 1 via ORAL
  Filled 2015-10-23: qty 1

## 2015-10-23 MED ORDER — KETOROLAC TROMETHAMINE 10 MG PO TABS
10.0000 mg | ORAL_TABLET | Freq: Once | ORAL | Status: AC
  Administered 2015-10-23: 10 mg via ORAL
  Filled 2015-10-23: qty 1

## 2015-10-23 MED ORDER — ONDANSETRON 4 MG PO TBDP
4.0000 mg | ORAL_TABLET | Freq: Once | ORAL | Status: AC
  Administered 2015-10-23: 4 mg via ORAL
  Filled 2015-10-23: qty 1

## 2015-10-23 NOTE — Discharge Instructions (Signed)

## 2015-10-23 NOTE — ED Provider Notes (Signed)
[facility] Emergency Department Provider Note  ____________________________________________  Time seen: 2:00 AM  I have reviewed the triage vital signs and the nursing notes.   HISTORY  Chief Complaint Blurred Vision      HPI Lori Henry is a 20 y.o. female presents with acute onset of blurred vision headache at 8:40 PM tonight. Patient states that she's had previous episodes of the same and only with her blood pressure was elevated. However the patient's blood pressure was normal at 120 with 70 none presentation to the emergency department. Patient denies weakness no numbness no gait instability or change in speech.     Past Medical History  Diagnosis Date  . Seizures (HCC)   . Hypertension   . Anxiety   . Depression     There are no active problems to display for this patient.   Past Surgical History  Procedure Laterality Date  . Wisdom tooth extraction      Current Outpatient Rx  Name  Route  Sig  Dispense  Refill  . citalopram (CELEXA) 20 MG tablet   Oral   Take 20 mg by mouth daily.         Marland Kitchen. lisinopril (PRINIVIL,ZESTRIL) 5 MG tablet   Oral   Take 5 mg by mouth daily.         Marland Kitchen. venlafaxine (EFFEXOR) 50 MG tablet   Oral   Take 50 mg by mouth daily.         . cyclobenzaprine (FLEXERIL) 10 MG tablet   Oral   Take 1 tablet (10 mg total) by mouth every 8 (eight) hours as needed for muscle spasms. Patient not taking: Reported on 10/23/2015   21 tablet   0   . naproxen (NAPROSYN) 500 MG tablet   Oral   Take 1 tablet (500 mg total) by mouth 2 (two) times daily with a meal. Patient not taking: Reported on 10/23/2015   30 tablet   0     Allergies Bee venom  No family history on file.  Social History Social History  Substance Use Topics  . Smoking status: Former Smoker -- 0.00 packs/day  . Smokeless tobacco: Never Used  . Alcohol Use: No    Review of Systems  Constitutional: Negative for fever. Eyes:  Negative for visual changes. ENT: Negative for sore throat. Cardiovascular: Negative for chest pain. Respiratory: Negative for shortness of breath. Gastrointestinal: Negative for abdominal pain, vomiting and diarrhea. Genitourinary: Negative for dysuria. Musculoskeletal: Negative for back pain. Skin: Negative for rash. Neurological: Positive for headaches, negative for focal weakness or numbness.   10-point ROS otherwise negative.  ____________________________________________   PHYSICAL EXAM:  VITAL SIGNS: ED Triage Vitals  Enc Vitals Group     BP 10/22/15 2249 120/79 mmHg     Pulse Rate 10/22/15 2249 73     Resp 10/22/15 2249 18     Temp 10/22/15 2249 98.5 F (36.9 C)     Temp Source 10/22/15 2249 Oral     SpO2 10/22/15 2249 100 %     Weight 10/22/15 2249 238 lb (107.956 kg)     Height 10/22/15 2249 5\' 5"  (1.651 m)     Head Cir --      Peak Flow --      Pain Score 10/22/15 2249 8     Pain Loc --      Pain Edu? --      Excl. in GC? --      Constitutional: Alert and  oriented. Well appearing and in no distress. Eyes: Conjunctivae are normal. PERRL. Normal extraocular movements. ENT   Head: Normocephalic and atraumatic.   Nose: No congestion/rhinnorhea.   Mouth/Throat: Mucous membranes are moist.   Neck: No stridor. Hematological/Lymphatic/Immunilogical: No cervical lymphadenopathy. Cardiovascular: Normal rate, regular rhythm. Normal and symmetric distal pulses are present in all extremities. No murmurs, rubs, or gallops. Respiratory: Normal respiratory effort without tachypnea nor retractions. Breath sounds are clear and equal bilaterally. No wheezes/rales/rhonchi. Gastrointestinal: Soft and nontender. No distention. There is no CVA tenderness. Genitourinary: deferred Musculoskeletal: Nontender with normal range of motion in all extremities. No joint effusions.  No lower extremity tenderness nor edema. Neurologic:  Normal speech and language. No gross  focal neurologic deficits are appreciated. Speech is normal.  Skin:  Skin is warm, dry and intact. No rash noted. Psychiatric: Mood and affect are normal. Speech and behavior are normal. Patient exhibits appropriate insight and judgment.    RADIOLOGY      CT Head Wo Contrast (Final result) Result time: 10/23/15 02:23:03   Final result by Rad Results In Interface (10/23/15 02:23:03)   Narrative:   CLINICAL DATA: Sudden onset bilateral blurry vision. Headache. History of hypertension.  EXAM: CT HEAD WITHOUT CONTRAST  TECHNIQUE: Contiguous axial images were obtained from the base of the skull through the vertex without intravenous contrast.  COMPARISON: None.  FINDINGS: Ventricles and sulci appear symmetrical. No ventricular dilatation. No significant white matter changes. No mass effect or midline shift. No abnormal extra-axial fluid collections. Gray-white matter junctions are distinct. Basal cisterns are not effaced. No evidence of acute intracranial hemorrhage. No depressed skull fractures. Visualized paranasal sinuses and mastoid air cells are not opacified.  IMPRESSION: No acute intracranial abnormalities demonstrated at CT.   Electronically Signed By: Burman Nieves M.D. On: 10/23/2015 02:23       INITIAL IMPRESSION / ASSESSMENT AND PLAN / ED COURSE  Pertinent labs & imaging results that were available during my care of the patient were reviewed by me and considered in my medical decision making (see chart for details).  Patient given Percocet one tablet in the emergency Department with improvement of headache. No clear etiology for the patient's headache and blurred vision noted as such patient is referred to her primary care provider for further evaluation and management on the outpatient setting.  ____________________________________________   FINAL CLINICAL IMPRESSION(S) / ED DIAGNOSES  Final diagnoses:  Acute nonintractable headache,  unspecified headache type      Darci Current, MD 10/23/15 [phone]

## 2015-10-23 NOTE — ED Notes (Signed)
Patient transported to CT 

## 2015-10-23 NOTE — ED Notes (Signed)
MD Brown at bedside.

## 2015-12-08 ENCOUNTER — Ambulatory Visit
Admission: EM | Admit: 2015-12-08 | Discharge: 2015-12-08 | Disposition: A | Discharge status: Home / Self Care | Payer: Medicaid Other | Attending: Family Medicine | Admitting: Family Medicine

## 2015-12-08 DIAGNOSIS — M545 Low back pain, unspecified: Secondary | ICD-10-CM

## 2015-12-08 DIAGNOSIS — I10 Essential (primary) hypertension: Secondary | ICD-10-CM | POA: Insufficient documentation

## 2015-12-08 DIAGNOSIS — Z87891 Personal history of nicotine dependence: Secondary | ICD-10-CM | POA: Diagnosis not present

## 2015-12-08 DIAGNOSIS — N390 Urinary tract infection, site not specified: Secondary | ICD-10-CM | POA: Insufficient documentation

## 2015-12-08 DIAGNOSIS — F329 Major depressive disorder, single episode, unspecified: Secondary | ICD-10-CM | POA: Diagnosis not present

## 2015-12-08 DIAGNOSIS — R569 Unspecified convulsions: Secondary | ICD-10-CM | POA: Diagnosis not present

## 2015-12-08 DIAGNOSIS — F419 Anxiety disorder, unspecified: Secondary | ICD-10-CM | POA: Diagnosis not present

## 2015-12-08 DIAGNOSIS — R109 Unspecified abdominal pain: Secondary | ICD-10-CM | POA: Diagnosis present

## 2015-12-08 LAB — URINALYSIS COMPLETE WITH MICROSCOPIC (ARMC ONLY)
BILIRUBIN URINE: NEGATIVE
GLUCOSE, UA: NEGATIVE mg/dL
Nitrite: POSITIVE — AB
Protein, ur: NEGATIVE mg/dL
Specific Gravity, Urine: 1.02 (ref 1.005–1.030)
pH: 6 (ref 5.0–8.0)

## 2015-12-08 LAB — PREGNANCY, URINE: PREG TEST UR: NEGATIVE

## 2015-12-08 MED ORDER — CIPROFLOXACIN HCL 500 MG PO TABS: 500.0000 mg | ORAL_TABLET | Freq: Two times a day (BID) | ORAL | Status: DC

## 2015-12-08 MED ORDER — TRAMADOL HCL 50 MG PO TABS: 50.0000 mg | ORAL_TABLET | Freq: Two times a day (BID) | ORAL | Status: DC | PRN

## 2015-12-08 NOTE — Discharge Instructions (Signed)
Back Pain, Adult °Back pain is very common in adults. The cause of back pain is rarely dangerous and the pain often gets better over time. The cause of your back pain may not be known. Some common causes of back pain include: °· Strain of the muscles or ligaments supporting the spine. °· Wear and tear (degeneration) of the spinal disks. °· Arthritis. °· Direct injury to the back. °For many people, back pain may return. Since back pain is rarely dangerous, most people can learn to manage this condition on their own. °HOME CARE INSTRUCTIONS °Watch your back pain for any changes. The following actions may help to lessen any discomfort you are feeling: °· Remain active. It is stressful on your back to sit or stand in one place for long periods of time. Do not sit, drive, or stand in one place for more than 30 minutes at a time. Take short walks on even surfaces as soon as you are able. Try to increase the length of time you walk each day. °· Exercise regularly as directed by your health care provider. Exercise helps your back heal faster. It also helps avoid future injury by keeping your muscles strong and flexible. °· Do not stay in bed. Resting more than 1-2 days can delay your recovery. °· Pay attention to your body when you bend and lift. The most comfortable positions are those that put less stress on your recovering back. Always use proper lifting techniques, including: °· Bending your knees. °· Keeping the load close to your body. °· Avoiding twisting. °· Find a comfortable position to sleep. Use a firm mattress and lie on your side with your knees slightly bent. If you lie on your back, put a pillow under your knees. °· Avoid feeling anxious or stressed. Stress increases muscle tension and can worsen back pain. It is important to recognize when you are anxious or stressed and learn ways to manage it, such as with exercise. °· Take medicines only as directed by your health care provider. Over-the-counter  medicines to reduce pain and inflammation are often the most helpful. Your health care provider may prescribe muscle relaxant drugs. These medicines help dull your pain so you can more quickly return to your normal activities and healthy exercise. °· Apply ice to the injured area: °· Put ice in a plastic bag. °· Place a towel between your skin and the bag. °· Leave the ice on for 20 minutes, 2-3 times a day for the first 2-3 days. After that, ice and heat may be alternated to reduce pain and spasms. °· Maintain a healthy weight. Excess weight puts extra stress on your back and makes it difficult to maintain good posture. °SEEK MEDICAL CARE IF: °· You have pain that is not relieved with rest or medicine. °· You have increasing pain going down into the legs or buttocks. °· You have pain that does not improve in one week. °· You have night pain. °· You lose weight. °· You have a fever or chills. °SEEK IMMEDIATE MEDICAL CARE IF:  °· You develop new bowel or bladder control problems. °· You have unusual weakness or numbness in your arms or legs. °· You develop nausea or vomiting. °· You develop abdominal pain. °· You feel faint. °  °This information is not intended to replace advice given to you by your health care provider. Make sure you discuss any questions you have with your health care provider. °  °Document Released: 07/20/2005 Document Revised: 08/10/2014 Document Reviewed: 11/21/2013 °Elsevier Interactive Patient Education ©2016 [employer]   Inc[employer] ° °Urinary Tract Infection °Urinary tract infections (UTIs) can develop anywhere along your urinary tract. Your urinary tract is your body's drainage system for removing wastes and extra water. Your urinary tract includes two kidneys, two ureters, a bladder, and a urethra. Your kidneys are a pair of bean-shaped organs. Each kidney is about the size of your fist. They are located below your ribs, one on each side of your spine. °CAUSES °Infections are caused by microbes,  which are microscopic organisms, including fungi, viruses, and bacteria. These organisms are so small that they can only be seen through a microscope. Bacteria are the microbes that most commonly cause UTIs. °SYMPTOMS  °Symptoms of UTIs may vary by age and gender of the patient and by the location of the infection. Symptoms in young women typically include a frequent and intense urge to urinate and a painful, burning feeling in the bladder or urethra during urination. Older women and men are more likely to be tired, shaky, and weak and have muscle aches and abdominal pain. A fever may mean the infection is in your kidneys. Other symptoms of a kidney infection include pain in your back or sides below the ribs, nausea, and vomiting. °DIAGNOSIS °To diagnose a UTI, your caregiver will ask you about your symptoms. Your caregiver will also ask you to provide a urine sample. The urine sample will be tested for bacteria and white blood cells. White blood cells are made by your body to help fight infection. °TREATMENT  °Typically, UTIs can be treated with medication. Because most UTIs are caused by a bacterial infection, they usually can be treated with the use of antibiotics. The choice of antibiotic and length of treatment depend on your symptoms and the type of bacteria causing your infection. °HOME CARE INSTRUCTIONS °· If you were prescribed antibiotics, take them exactly as your caregiver instructs you. Finish the medication even if you feel better after you have only taken some of the medication. °· Drink enough water and fluids to keep your urine clear or pale yellow. °· Avoid caffeine, tea, and carbonated beverages. They tend to irritate your bladder. °· Empty your bladder often. Avoid holding urine for long periods of time. °· Empty your bladder before and after sexual intercourse. °· After a bowel movement, women should cleanse from front to back. Use each tissue only once. °SEEK MEDICAL CARE IF:  °· You have back  pain. °· You develop a fever. °· Your symptoms do not begin to resolve within 3 days. °SEEK IMMEDIATE MEDICAL CARE IF:  °· You have severe back pain or lower abdominal pain. °· You develop chills. °· You have nausea or vomiting. °· You have continued burning or discomfort with urination. °MAKE SURE YOU:  °· Understand these instructions. °· Will watch your condition. °· Will get help right away if you are not doing well or get worse. °  °This information is not intended to replace advice given to you by your health care provider. Make sure you discuss any questions you have with your health care provider. °  °Document Released: 04/29/2005 Document Revised: 04/10/2015 Document Reviewed: 08/28/2011 °Elsevier Interactive Patient Education ©2016 Elsevier Inc. ° °

## 2015-12-08 NOTE — ED Provider Notes (Signed)
CSN: [id number]     Arrival date & time 12/08/15  1306 History   First MD Initiated Contact with Patient 12/08/15 1417     Chief Complaint  Patient presents with  . Flank Pain    Right flank pain starting on Thursday. Pt asking for pregnancy test here after positive results at home. Pain 10/10 and reports "I have vomited from the pain"   (Consider location/radiation/quality/duration/timing/severity/associated sxs/prior Treatment) HPI  20 year old female presents with complaints of right back pain.  Patient states that this began on Thursday. She states that her pain is located in the right mid to low back. She reports that she's had a few episodes of associated nausea and vomiting. She states that her pain is severe, 10/10. She reports improvement with pressure. She states is worse with standing. No reports of radiation. Patient denies any dysuria, urgency, frequency. She has noticed malodorous urine. She has taken Toradol with no improvement.  Of note, patient states that she has been recently trying to get pregnant. She states that she had a positive pregnancy test at home 3 days ago.  Past Medical History  Diagnosis Date  . Seizures (HCC)   . Hypertension   . Anxiety   . Depression    Past Surgical History  Procedure Laterality Date  . Wisdom tooth extraction     History reviewed. No pertinent family history. Social History  Substance Use Topics  . Smoking status: Former Smoker -- 0.00 packs/day  . Smokeless tobacco: Never Used  . Alcohol Use: No   OB History    No data available     Review of Systems  Constitutional: Negative.   Genitourinary: Negative for dysuria, urgency and frequency.  Musculoskeletal: Positive for back pain.    Allergies  Bee venom  Home Medications   Prior to Admission medications   Medication Sig Start Date End Date Taking? Authorizing Provider  medroxyPROGESTERone (PROVERA) 5 MG tablet Take 5 mg by mouth daily.   Yes Historical Provider,  MD  ciprofloxacin (CIPRO) 500 MG tablet Take 1 tablet (500 mg total) by mouth every 12 (twelve) hours. 12/08/15   Tommie Sams, DO  cyclobenzaprine (FLEXERIL) 10 MG tablet Take 1 tablet (10 mg total) by mouth every 8 (eight) hours as needed for muscle spasms. Patient not taking: Reported on 10/23/2015 09/06/15   Ignacia Bayley, PA-C  ketorolac (TORADOL) 10 MG tablet Take 1 tablet (10 mg total) by mouth every 8 (eight) hours as needed. 10/23/15   Darci Current, MD  traMADol (ULTRAM) 50 MG tablet Take 1 tablet (50 mg total) by mouth every 12 (twelve) hours as needed. 12/08/15   Tommie Sams, DO   Meds Ordered and Administered this Visit  Medications - No data to display  BP 126/81 mmHg  Pulse 85  Temp(Src) 98.1 F (36.7 C) (Oral)  Resp 20  Ht  (1.651 m)  Wt 240 lb (108.863 kg)  BMI 39.94 kg/m2  SpO2 100%  LMP 11/08/2015 No data found.  Physical Exam  Constitutional:  Well-appearing obese female in no acute distress. Does not appear to be in pain at this time.  HENT:  Head: Normocephalic and atraumatic.  Cardiovascular: Normal rate and regular rhythm.   Pulmonary/Chest: Breath sounds normal.  Abdominal: Soft. She exhibits no distension. There is no tenderness.  No CVA tenderness.  Neurological: She is alert.  Vitals reviewed.  ED Course  Procedures (including critical care time)  Labs Review Labs Reviewed  URINALYSIS COMPLETEWITH MICROSCOPIC [facility]  ONLY) - Abnormal; Notable for the following:    APPearance CLOUDY (*)    Ketones, ur TRACE (*)    Hgb urine dipstick 3+ (*)    Nitrite POSITIVE (*)    Leukocytes, UA 1+ (*)    Bacteria, UA MANY (*)    Squamous Epithelial / LPF 6-30 (*)    All other components within normal limits  URINE CULTURE  PREGNANCY, URINE   Imaging Review No results found.  MDM   1. Right-sided low back pain without sciatica   2. UTI (lower urinary tract infection)    20 year old female presents with complaints of back pain. Negative CVA  tenderness. Exam unremarkable. Urinalysis notable for hemoglobin, positive nitrites, and many bacteria. Also with leukocytes. Patient has no urinary tract symptoms. Urine pregnancy test negative. I suspect that her pain is MSK in nature given exam and history. Treating with tramadol. Given urinalysis, treating empirically to cover for pyelonephritis although I have a low suspicion for this. Treating with Cipro. I cautioned the patient not attempt to get pregnant while on these medications.    Tommie SamsJayce G Atalie Oros, DO 12/08/15 1457

## 2015-12-10 LAB — URINE CULTURE
Culture: >=100000 — AB
Special Requests: NORMAL

## 2016-04-23 ENCOUNTER — Encounter: Payer: Self-pay | Admitting: *Deleted

## 2016-04-23 ENCOUNTER — Ambulatory Visit
Admission: EM | Admit: 2016-04-23 | Discharge: 2016-04-23 | Disposition: A | Discharge status: Home / Self Care | Payer: Medicaid Other | Attending: Emergency Medicine | Admitting: Emergency Medicine

## 2016-04-23 DIAGNOSIS — J0100 Acute maxillary sinusitis, unspecified: Secondary | ICD-10-CM | POA: Diagnosis not present

## 2016-04-23 MED ORDER — FLUTICASONE PROPIONATE 50 MCG/ACT NA SUSP: 2.0000 | Freq: Every day | NASAL | 0 refills | Status: DC

## 2016-04-23 MED ORDER — CETIRIZINE-PSEUDOEPHEDRINE ER 5-120 MG PO TB12: 1.0000 | ORAL_TABLET | Freq: Two times a day (BID) | ORAL | 0 refills | Status: DC

## 2016-04-23 MED ORDER — AZITHROMYCIN 250 MG PO TABS: 250.0000 mg | ORAL_TABLET | Freq: Every day | ORAL | 0 refills | Status: DC

## 2016-04-23 NOTE — ED Provider Notes (Signed)
CSN: [id number]     Arrival date & time 04/23/16  1720 History   First MD Initiated Contact with Patient 04/23/16 1856     Chief Complaint  Patient presents with  . Nasal Congestion  . Cough  . Shortness of Breath   (Consider location/radiation/quality/duration/timing/severity/associated sxs/prior Treatment) HPI   This a 20 year old female who presents with a one-week history cough nasal congestion and sore throat and weakness. Been trying over-the-counter medications which have not proven effective. States is worse at nighttime when she lays down that she has a collection of mucus in the back of her throat and her nose which keeps her from breathing adequately. Coffee is to be throughout the daytime but is mostly nighttime now. She's not been running fever or chills and is not febrile today. Does have tenderness over the maxillary sinuses. She has missed several days of work.      Past Medical History:  Diagnosis Date  . Anxiety   . Depression   . Hypertension   . Seizures (HCC)    Past Surgical History:  Procedure Laterality Date  . WISDOM TOOTH EXTRACTION     History reviewed. No pertinent family history. Social History  Substance Use Topics  . Smoking status: Former Smoker    Packs/day: 0.00  . Smokeless tobacco: Never Used  . Alcohol use No   OB History    No data available     Review of Systems  Constitutional: Positive for activity change and fatigue. Negative for chills and fever.  HENT: Positive for congestion, postnasal drip, rhinorrhea, sinus pressure, sneezing and sore throat.   Respiratory: Positive for cough. Negative for shortness of breath, wheezing and stridor.   All other systems reviewed and are negative.   Allergies  Bee venom  Home Medications   Prior to Admission medications   Medication Sig Start Date End Date Taking? Authorizing Provider  azithromycin (ZITHROMAX) 250 MG tablet Take 1 tablet (250 mg total) by mouth daily. Take first 2  tablets together, then 1 every day until finished. 04/23/16   Lutricia FeilWilliam P Tailyn Hantz, PA-C  cetirizine-pseudoephedrine (ZYRTEC-D) 5-120 MG tablet Take 1 tablet by mouth 2 (two) times daily. 04/23/16   Lutricia FeilWilliam P Aftan Vint, PA-C  fluticasone (FLONASE) 50 MCG/ACT nasal spray Place 2 sprays into both nostrils daily. 04/23/16   Lutricia FeilWilliam P Breeley Bischof, PA-C   Meds Ordered and Administered this Visit  Medications - No data to display  BP (!) 142/88 (BP Location: Left Arm)   Pulse 98   Temp 98.6 F (37 C) (Oral)   Resp 18   Ht 5\' 5"  (1.651 m)   Wt 240 lb (108.9 kg)   LMP 02/21/2016   SpO2 100%   BMI 39.94 kg/m  No data found.   Physical Exam  Constitutional: She is oriented to person, place, and time. She appears well-developed and well-nourished. No distress.  HENT:  Head: Normocephalic and atraumatic.  Right Ear: External ear normal.  Left Ear: External ear normal.  Nose: Nose normal.  Mouth/Throat: No oropharyngeal exudate.  Her tonsils are enlarged bilaterally but without exudate. There is no cervical adenopathy appreciated. She has tenderness to percussion over the maxillary sinuses. She has non-over the frontal.  Eyes: EOM are normal. Pupils are equal, round, and reactive to light. Right eye exhibits no discharge. Left eye exhibits no discharge.  Neck: Normal range of motion. Neck supple.  Pulmonary/Chest: Effort normal and breath sounds normal. No respiratory distress. She has no wheezes. She has no rales.  Musculoskeletal: Normal range of motion.  Neurological: She is alert and oriented to person, place, and time.  Skin: Skin is warm and dry. She is not diaphoretic.  Psychiatric: She has a normal mood and affect. Her behavior is normal. Judgment and thought content normal.  Nursing note and vitals reviewed.   Urgent Care Course   Clinical Course    Procedures (including critical care time)  Labs Review Labs Reviewed - No data to display  Imaging Review No results found.   Visual  Acuity Review  Right Eye Distance:   Left Eye Distance:   Bilateral Distance:    Right Eye Near:   Left Eye Near:    Bilateral Near:         MDM   1. Acute maxillary sinusitis, recurrence not specified    Discharge Medication List as of 04/23/2016  7:10 PM    START taking these medications   Details  azithromycin (ZITHROMAX) 250 MG tablet Take 1 tablet (250 mg total) by mouth daily. Take first 2 tablets together, then 1 every day until finished., Starting Thu 04/23/2016, Print    cetirizine-pseudoephedrine (ZYRTEC-D) 5-120 MG tablet Take 1 tablet by mouth 2 (two) times daily., Starting Thu 04/23/2016, Print    fluticasone (FLONASE) 50 MCG/ACT nasal spray Place 2 sprays into both nostrils daily., Starting Thu 04/23/2016, Print      Plan: 1. Test/x-ray results and diagnosis reviewed with patient 2. rx as per orders; risks, benefits, potential side effects reviewed with patient 3. Recommend supportive treatment with Cool mist vaporizer at nighttime. Use Flonase for 3-4 weeks to promote drainage. Short Term use of Zyrtec-D may help with her congestion. If she is not improving or is worsening should follow-up with her primary care physician. 4. F/u prn if symptoms worsen or don't improve     Lutricia Feil, PA-C 04/23/16 1917

## 2016-04-23 NOTE — ED Triage Notes (Signed)
Patient started having symptoms of cough, nasal congestion, sore throat, and weakness 1 week ago. OTC medications do not resolve symptoms.

## 2016-10-16 ENCOUNTER — Encounter
Admission: RE | Admit: 2016-10-16 | Discharge: 2016-10-16 | Disposition: A | Discharge status: Home / Self Care | Payer: Medicaid Other | Source: Ambulatory Visit | Attending: Surgery | Admitting: Surgery

## 2016-10-16 ENCOUNTER — Encounter: Payer: Self-pay | Admitting: *Deleted

## 2016-10-16 DIAGNOSIS — Z01818 Encounter for other preprocedural examination: Secondary | ICD-10-CM | POA: Diagnosis not present

## 2016-10-16 DIAGNOSIS — I10 Essential (primary) hypertension: Secondary | ICD-10-CM | POA: Diagnosis not present

## 2016-10-16 LAB — BASIC METABOLIC PANEL
ANION GAP: 5 (ref 5–15)
BUN: 11 mg/dL (ref 6–20)
CHLORIDE: 106 mmol/L (ref 101–111)
CO2: 26 mmol/L (ref 22–32)
Calcium: 9.4 mg/dL (ref 8.9–10.3)
Creatinine, Ser: 0.57 mg/dL (ref 0.44–1.00)
GFR calc Af Amer: >60 mL/min (ref >60)
GFR calc non Af Amer: >60 mL/min (ref >60)
GLUCOSE: 95 mg/dL (ref 65–99)
Potassium: 3.7 mmol/L (ref 3.5–5.1)
Sodium: 137 mmol/L (ref 135–145)

## 2016-10-16 LAB — CBC
HEMATOCRIT: 36.8 % (ref 35.0–47.0)
HEMOGLOBIN: 12.3 g/dL (ref 12.0–16.0)
MCH: 26.7 pg (ref 26.0–34.0)
MCHC: 33.4 g/dL (ref 32.0–36.0)
MCV: 79.9 fL — ABNORMAL LOW (ref 80.0–100.0)
Platelets: 344 10*3/uL (ref 150–440)
RBC: 4.61 MIL/uL (ref 3.80–5.20)
RDW: 15 % — ABNORMAL HIGH (ref 11.5–14.5)
WBC: 9.4 10*3/uL (ref 3.6–11.0)

## 2016-10-16 NOTE — Patient Instructions (Signed)
  Your procedure is scheduled on: Friday 10/23/16 Report to Day Surgery To find out your arrival time please call [phone] between 1PM - 3PM on Thurs. 10/22/16.  Remember: Instructions that are not followed completely may result in serious medical risk, up to and including death, or upon the discretion of your surgeon and anesthesiologist your surgery may need to be rescheduled.    _x___ 1. Do not eat food or drink liquids after midnight. No gum chewing or hard candies.     __x__ 2. No Alcohol for 24 hours before or after surgery.   __x__ 3. Do Not Smoke For 24 Hours Prior to Your Surgery.   ____ 4. Bring all medications with you on the day of surgery if instructed.    _x___ 5. Notify your doctor if there is any change in your medical condition     (cold, fever, infections).       Do not wear jewelry, make-up, hairpins, clips or nail polish.  Do not wear lotions, powders, or perfumes. You may wear deodorant.  Do not shave 48 hours prior to surgery. Men may shave face and neck.  Do not bring valuables to the hospital.    [facility] is not [facility] for any belongings or valuables.               Contacts, dentures or bridgework may not be worn into surgery.  Leave your suitcase in the car. After surgery it may be brought to your room.  For patients admitted to the hospital, discharge time is determined by your                treatment team.   Patients discharged the day of surgery will not be allowed to drive home.   Please read over the following fact sheets that you were given:      __x__ Take these medicines the morning of surgery with A SIP OF WATER:    1. escitalopram (LEXAPRO) 20 MG tablet  2. lisinopril (PRINIVIL,ZESTRIL) 5 MG tablet  3. venlafaxine XR (EFFEXOR-XR) 150 MG 24 hr capsule  4.calcium carbonate (TUMS - DOSED IN MG ELEMENTAL CALCIUM) 500 MG chewable tablet if needed  5.  6.  ____ Fleet Enema (as directed)   _x___ Use CHG Soap as directed  ____ Use  inhalers on the day of surgery  ____ Stop metformin 2 days prior to surgery    ____ Take 1/2 of usual insulin dose the night before surgery and none on the morning of surgery.   ____ Stop Coumadin/Plavix/aspirin on   __x__ Stop Anti-inflammatories on today ibuprofen (ADVIL,MOTRIN) 200 MG tablet ( May take Tylenol)   ____ Stop supplements until after surgery.    ____ Bring C-Pap to the hospital.

## 2016-10-23 ENCOUNTER — Ambulatory Visit: Payer: Medicaid Other | Admitting: Anesthesiology

## 2016-10-23 ENCOUNTER — Encounter: Payer: Self-pay | Admitting: *Deleted

## 2016-10-23 ENCOUNTER — Ambulatory Visit: Payer: Medicaid Other

## 2016-10-23 ENCOUNTER — Encounter
Admission: RE | Disposition: A | Discharge status: Home / Self Care | Payer: Self-pay | Source: Ambulatory Visit | Attending: Surgery

## 2016-10-23 ENCOUNTER — Ambulatory Visit
Admission: RE | Admit: 2016-10-23 | Discharge: 2016-10-23 | Disposition: A | Discharge status: Home / Self Care | Payer: Medicaid Other | Source: Ambulatory Visit | Attending: Surgery | Admitting: Surgery

## 2016-10-23 DIAGNOSIS — F329 Major depressive disorder, single episode, unspecified: Secondary | ICD-10-CM | POA: Insufficient documentation

## 2016-10-23 DIAGNOSIS — K8012 Calculus of gallbladder with acute and chronic cholecystitis without obstruction: Secondary | ICD-10-CM | POA: Insufficient documentation

## 2016-10-23 DIAGNOSIS — I10 Essential (primary) hypertension: Secondary | ICD-10-CM | POA: Insufficient documentation

## 2016-10-23 DIAGNOSIS — Z419 Encounter for procedure for purposes other than remedying health state, unspecified: Secondary | ICD-10-CM

## 2016-10-23 DIAGNOSIS — Z79899 Other long term (current) drug therapy: Secondary | ICD-10-CM | POA: Diagnosis not present

## 2016-10-23 DIAGNOSIS — K760 Fatty (change of) liver, not elsewhere classified: Secondary | ICD-10-CM | POA: Diagnosis not present

## 2016-10-23 DIAGNOSIS — R109 Unspecified abdominal pain: Secondary | ICD-10-CM | POA: Diagnosis present

## 2016-10-23 HISTORY — PX: CHOLECYSTECTOMY: SHX55

## 2016-10-23 LAB — POCT PREGNANCY, URINE: PREG TEST UR: NEGATIVE

## 2016-10-23 SURGERY — LAPAROSCOPIC CHOLECYSTECTOMY WITH INTRAOPERATIVE CHOLANGIOGRAM
Anesthesia: General | Site: Abdomen | Wound class: Clean

## 2016-10-23 MED ORDER — MIDAZOLAM HCL 2 MG/2ML IJ SOLN
INTRAMUSCULAR | Status: DC | PRN
  Administered 2016-10-23: 2 mg via INTRAVENOUS

## 2016-10-23 MED ORDER — LACTATED RINGERS IV SOLN
INTRAVENOUS | Status: DC
  Administered 2016-10-23 (×2): via INTRAVENOUS

## 2016-10-23 MED ORDER — FENTANYL CITRATE (PF) 100 MCG/2ML IJ SOLN
INTRAMUSCULAR | Status: DC | PRN
  Administered 2016-10-23: 100 ug via INTRAVENOUS
  Administered 2016-10-23 (×4): 50 ug via INTRAVENOUS

## 2016-10-23 MED ORDER — FENTANYL CITRATE (PF) 100 MCG/2ML IJ SOLN
INTRAMUSCULAR | Status: AC
  Filled 2016-10-23: qty 2

## 2016-10-23 MED ORDER — SUCCINYLCHOLINE CHLORIDE 20 MG/ML IJ SOLN
INTRAMUSCULAR | Status: DC | PRN
  Administered 2016-10-23: 120 mg via INTRAVENOUS

## 2016-10-23 MED ORDER — PROPOFOL 10 MG/ML IV BOLUS
INTRAVENOUS | Status: AC
  Filled 2016-10-23: qty 20

## 2016-10-23 MED ORDER — HYDROCODONE-ACETAMINOPHEN 5-325 MG PO TABS: 1.0000 | ORAL_TABLET | ORAL | Status: DC | PRN

## 2016-10-23 MED ORDER — HEPARIN SODIUM (PORCINE) 5000 UNIT/ML IJ SOLN
INTRAMUSCULAR | Status: AC
  Filled 2016-10-23: qty 1

## 2016-10-23 MED ORDER — LIDOCAINE HCL (PF) 2 % IJ SOLN
INTRAMUSCULAR | Status: AC
  Filled 2016-10-23: qty 2

## 2016-10-23 MED ORDER — FENTANYL CITRATE (PF) 100 MCG/2ML IJ SOLN
INTRAMUSCULAR | Status: AC
  Administered 2016-10-23: 25 ug via INTRAVENOUS
  Filled 2016-10-23: qty 2

## 2016-10-23 MED ORDER — FAMOTIDINE 20 MG PO TABS
20.0000 mg | ORAL_TABLET | Freq: Once | ORAL | Status: AC
  Administered 2016-10-23: 20 mg via ORAL

## 2016-10-23 MED ORDER — ACETAMINOPHEN 500 MG PO TABS
1000.0000 mg | ORAL_TABLET | Freq: Once | ORAL | Status: AC
  Administered 2016-10-23: 1000 mg via ORAL

## 2016-10-23 MED ORDER — FAMOTIDINE 20 MG PO TABS
ORAL_TABLET | ORAL | Status: AC
  Administered 2016-10-23: 20 mg via ORAL
  Filled 2016-10-23: qty 1

## 2016-10-23 MED ORDER — PROPOFOL 10 MG/ML IV BOLUS
INTRAVENOUS | Status: DC | PRN
  Administered 2016-10-23: 200 mg via INTRAVENOUS

## 2016-10-23 MED ORDER — ACETAMINOPHEN 500 MG PO TABS
ORAL_TABLET | ORAL | Status: AC
  Filled 2016-10-23: qty 2

## 2016-10-23 MED ORDER — MIDAZOLAM HCL 2 MG/2ML IJ SOLN
INTRAMUSCULAR | Status: AC
  Filled 2016-10-23: qty 2

## 2016-10-23 MED ORDER — SODIUM CHLORIDE 0.9 % IJ SOLN
INTRAMUSCULAR | Status: AC
  Filled 2016-10-23: qty 50

## 2016-10-23 MED ORDER — SUGAMMADEX SODIUM 200 MG/2ML IV SOLN
INTRAVENOUS | Status: AC
  Filled 2016-10-23: qty 2

## 2016-10-23 MED ORDER — ONDANSETRON HCL 4 MG/2ML IJ SOLN
INTRAMUSCULAR | Status: DC | PRN
  Administered 2016-10-23: 4 mg via INTRAVENOUS

## 2016-10-23 MED ORDER — KETOROLAC TROMETHAMINE 30 MG/ML IJ SOLN
INTRAMUSCULAR | Status: AC
  Filled 2016-10-23: qty 1

## 2016-10-23 MED ORDER — SUGAMMADEX SODIUM 500 MG/5ML IV SOLN
INTRAVENOUS | Status: DC | PRN
  Administered 2016-10-23: 220 mg via INTRAVENOUS

## 2016-10-23 MED ORDER — HYDROCODONE-ACETAMINOPHEN 5-325 MG PO TABS: 1.0000 | ORAL_TABLET | ORAL | 0 refills | Status: DC | PRN

## 2016-10-23 MED ORDER — PROMETHAZINE HCL 25 MG/ML IJ SOLN: 6.2500 mg | INTRAMUSCULAR | Status: DC | PRN

## 2016-10-23 MED ORDER — SODIUM CHLORIDE 0.9 % IV SOLN
INTRAVENOUS | Status: DC | PRN
  Administered 2016-10-23: 1000 mL via INTRAMUSCULAR

## 2016-10-23 MED ORDER — ROCURONIUM BROMIDE 50 MG/5ML IV SOLN
INTRAVENOUS | Status: AC
  Filled 2016-10-23: qty 1

## 2016-10-23 MED ORDER — FENTANYL CITRATE (PF) 100 MCG/2ML IJ SOLN
25.0000 ug | INTRAMUSCULAR | Status: AC | PRN
  Administered 2016-10-23 (×6): 25 ug via INTRAVENOUS

## 2016-10-23 MED ORDER — ROCURONIUM BROMIDE 100 MG/10ML IV SOLN
INTRAVENOUS | Status: DC | PRN
  Administered 2016-10-23: 5 mg via INTRAVENOUS
  Administered 2016-10-23: 10 mg via INTRAVENOUS
  Administered 2016-10-23: 45 mg via INTRAVENOUS
  Administered 2016-10-23: 10 mg via INTRAVENOUS

## 2016-10-23 MED ORDER — DEXAMETHASONE SODIUM PHOSPHATE 10 MG/ML IJ SOLN
INTRAMUSCULAR | Status: DC | PRN
  Administered 2016-10-23: 8 mg via INTRAVENOUS

## 2016-10-23 MED ORDER — SUCCINYLCHOLINE CHLORIDE 20 MG/ML IJ SOLN: INTRAMUSCULAR | Status: DC | PRN

## 2016-10-23 MED ORDER — SODIUM CHLORIDE 0.9 % IV SOLN
INTRAVENOUS | Status: DC | PRN
  Administered 2016-10-23: 14 mL

## 2016-10-23 MED ORDER — EPHEDRINE SULFATE 50 MG/ML IJ SOLN
INTRAMUSCULAR | Status: DC | PRN
  Administered 2016-10-23: 5 mg via INTRAVENOUS

## 2016-10-23 MED ORDER — FENTANYL CITRATE (PF) 100 MCG/2ML IJ SOLN
25.0000 ug | INTRAMUSCULAR | Status: AC | PRN
  Administered 2016-10-23 (×2): 25 ug via INTRAVENOUS

## 2016-10-23 MED ORDER — LIDOCAINE HCL (CARDIAC) 20 MG/ML IV SOLN
INTRAVENOUS | Status: DC | PRN
  Administered 2016-10-23: 60 mg via INTRAVENOUS

## 2016-10-23 SURGICAL SUPPLY — 38 items
APPLIER CLIP ROT 10 11.4 M/L (STAPLE) ×3
CANISTER SUCT 1200ML W/VALVE (MISCELLANEOUS) ×3 IMPLANT
CANNULA DILATOR 10 W/SLV (CANNULA) ×2 IMPLANT
CANNULA DILATOR 10MM W/SLV (CANNULA) ×1
CATH REDDICK CHOLANGI 4FR 50CM (CATHETERS) ×3 IMPLANT
CHLORAPREP W/TINT 26ML (MISCELLANEOUS) ×3 IMPLANT
CLIP APPLIE ROT 10 11.4 M/L (STAPLE) ×1 IMPLANT
CLOSURE WOUND 1/2 X4 (GAUZE/BANDAGES/DRESSINGS) ×1
DRAPE SHEET LG 3/4 BI-LAMINATE (DRAPES) ×3 IMPLANT
ELECT REM PT RETURN 9FT ADLT (ELECTROSURGICAL) ×3
ELECTRODE REM PT RTRN 9FT ADLT (ELECTROSURGICAL) ×1 IMPLANT
GAUZE SPONGE 4X4 12PLY STRL (GAUZE/BANDAGES/DRESSINGS) ×3 IMPLANT
GLOVE BIO SURGEON STRL SZ7.5 (GLOVE) ×3 IMPLANT
GOWN STRL REUS W/ TWL LRG LVL3 (GOWN DISPOSABLE) ×4 IMPLANT
GOWN STRL REUS W/TWL LRG LVL3 (GOWN DISPOSABLE) ×8
IRRIGATION STRYKERFLOW (MISCELLANEOUS) ×1 IMPLANT
IRRIGATOR STRYKERFLOW (MISCELLANEOUS) ×3
IV NS 1000ML (IV SOLUTION) ×2
IV NS 1000ML BAXH (IV SOLUTION) ×1 IMPLANT
KIT RM TURNOVER STRD PROC AR (KITS) ×3 IMPLANT
LABEL OR SOLS (LABEL) ×3 IMPLANT
NDL INSUFF ACCESS 14 VERSASTEP (NEEDLE) ×3 IMPLANT
NEEDLE FILTER BLUNT 18X 1/2SAF (NEEDLE) ×2
NEEDLE FILTER BLUNT 18X1 1/2 (NEEDLE) ×1 IMPLANT
NS IRRIG 500ML POUR BTL (IV SOLUTION) ×3 IMPLANT
PACK LAP CHOLECYSTECTOMY (MISCELLANEOUS) ×3 IMPLANT
SCISSORS METZENBAUM CVD 33 (INSTRUMENTS) ×3 IMPLANT
SEAL FOR SCOPE WARMER C3101 (MISCELLANEOUS) ×3 IMPLANT
SLEEVE ENDOPATH XCEL 5M (ENDOMECHANICALS) ×3 IMPLANT
STRIP CLOSURE SKIN 1/2X4 (GAUZE/BANDAGES/DRESSINGS) ×2 IMPLANT
SUT CHROMIC 5 0 RB 1 27 (SUTURE) ×3 IMPLANT
SUT MAXON 0 T 12 3 (SUTURE) ×3 IMPLANT
SUT VIC AB 0 CT2 27 (SUTURE) IMPLANT
SYR 3ML LL SCALE MARK (SYRINGE) ×3 IMPLANT
TROCAR XCEL NON-BLD 11X100MML (ENDOMECHANICALS) ×3 IMPLANT
TROCAR XCEL NON-BLD 5MMX100MML (ENDOMECHANICALS) ×3 IMPLANT
TUBING INSUFFLATOR HI FLOW (MISCELLANEOUS) ×3 IMPLANT
WATER STERILE IRR 1000ML POUR (IV SOLUTION) ×3 IMPLANT

## 2016-10-23 NOTE — Anesthesia Postprocedure Evaluation (Signed)
Anesthesia Post Note  Patient: Lori Henry  Procedure(s) Performed: Procedure(s) (LRB): LAPAROSCOPIC CHOLECYSTECTOMY WITH INTRAOPERATIVE CHOLANGIOGRAM (N/A)  Patient location during evaluation: PACU Anesthesia Type: General Level of consciousness: awake and alert Pain management: pain level controlled Vital Signs Assessment: post-procedure vital signs reviewed and stable Respiratory status: spontaneous breathing, nonlabored ventilation, respiratory function stable and patient connected to nasal cannula oxygen Cardiovascular status: blood pressure returned to baseline and stable Postop Assessment: no signs of nausea or vomiting Anesthetic complications: no     Last Vitals:  Vitals:   10/23/16 1848 10/23/16 1931  BP: 127/77 113/74  Pulse: 69 89  Resp: 14 14  Temp: 37.3 C 36.3 C    Last Pain:  Vitals:   10/23/16 1931  TempSrc: Temporal  PainSc: 8                  Gavynn Duvall S

## 2016-10-23 NOTE — Anesthesia Post-op Follow-up Note (Cosign Needed)
Anesthesia QCDR form completed.        

## 2016-10-23 NOTE — OR Nursing (Signed)
Dr. Katrinka BlazingSmith in to see pt 7 pm, advises ok to d/c to home

## 2016-10-23 NOTE — H&P (Signed)
  She comes in prepared for laparoscopic cholecystectomy. She does have history of right upper quadrant abdominal pain. She also has some occasional heartburn. She reports no change in overall condition since the office visit.  Lab work was reviewed.  I discussed her recent MRI findings of 2 small nodules in the liver and anticipate she'll have a follow-up MRI in 6 months  I discussed the plan for laparoscopic cholecystectomy

## 2016-10-23 NOTE — Transfer of Care (Signed)
Immediate Anesthesia Transfer of Care Note  Patient: Lori Henry  Procedure(s) Performed: Procedure(s): LAPAROSCOPIC CHOLECYSTECTOMY WITH INTRAOPERATIVE CHOLANGIOGRAM (N/A)  Patient Location: PACU  Anesthesia Type:General  Level of Consciousness: sedated  Airway & Oxygen Therapy: Patient connected to face mask oxygen  Post-op Assessment: Post -op Vital signs reviewed and stable  Post vital signs: stable  Last Vitals:  Vitals:   10/23/16 1302 10/23/16 1726  BP: (!) 130/92 125/67  Pulse: 82 (!) 104  Resp: 16 20  Temp: 37.1 C 36.7 C    Last Pain:  Vitals:   10/23/16 1726  TempSrc: Temporal         Complications: No apparent anesthesia complications

## 2016-10-23 NOTE — Discharge Instructions (Addendum)
Take Tylenol or Norco if needed for pain.  Should not drive or do anything dangerous when taking Norco.  Remove dressings on Sunday.  May shower Monday.    AMBULATORY SURGERY  DISCHARGE INSTRUCTIONS   1) The drugs that you were given will stay in your system until tomorrow so for the next 24 hours you should not:  A) Drive an automobile B) Make any legal decisions C) Drink any alcoholic beverage   2) You may resume regular meals tomorrow.  Today it is better to start with liquids and gradually work up to solid foods.  You may eat anything you prefer, but it is better to start with liquids, then soup and crackers, and gradually work up to solid foods.   3) Please notify your doctor immediately if you have any unusual bleeding, trouble breathing, redness and pain at the surgery site, drainage, fever, or pain not relieved by medication.    4) Additional Instructions:        Please contact your physician with any problems or Same Day Surgery at [phone], Monday through Friday 6 am to 4 pm, or [place] at [facility] Main number at [phone].

## 2016-10-23 NOTE — Anesthesia Procedure Notes (Signed)
Procedure Name: Intubation Date/Time: 10/23/2016 2:12 PM Performed by: Allean Found Pre-anesthesia Checklist: Patient identified, Emergency Drugs available, Suction available, Patient being monitored and Timeout performed Patient Re-evaluated:Patient Re-evaluated prior to inductionOxygen Delivery Method: Circle system utilized Preoxygenation: Pre-oxygenation with 100% oxygen Intubation Type: IV induction Ventilation: Mask ventilation without difficulty Laryngoscope Size: Mac and 3 Grade View: Grade I Tube type: Oral Tube size: 7.0 mm Number of attempts: 1 Airway Equipment and Method: Stylet Placement Confirmation: ETT inserted through vocal cords under direct vision,  positive ETCO2 and breath sounds checked- equal and bilateral Secured at: 21 cm Tube secured with: Tape Dental Injury: Teeth and Oropharynx as per pre-operative assessment

## 2016-10-23 NOTE — Anesthesia Preprocedure Evaluation (Signed)
Anesthesia Evaluation  Patient identified by MRN, date of birth, ID band Patient awake    Reviewed: Allergy & Precautions, H&P , NPO status , Patient's Chart, lab work & pertinent test results, reviewed documented beta blocker date and time   History of Anesthesia Complications Negative for: history of anesthetic complications  Airway Mallampati: IV  TM Distance: >3 FB Neck ROM: full    Dental  (+) Teeth Intact   Pulmonary neg shortness of breath, neg sleep apnea, neg COPD, neg recent URI, Current Smoker,           Cardiovascular Exercise Tolerance: Good hypertension, (-) angina(-) CAD, (-) Past MI, (-) Cardiac Stents and (-) CABG (-) dysrhythmias (-) Valvular Problems/Murmurs     Neuro/Psych Seizures -, Well Controlled,  PSYCHIATRIC DISORDERS (Depression)    GI/Hepatic Neg liver ROS, GERD  ,  Endo/Other  neg diabetesMorbid obesity  Renal/GU negative Renal ROS  negative genitourinary   Musculoskeletal   Abdominal   Peds  Hematology negative hematology ROS (+)   Anesthesia Other Findings Past Medical History: No date: Anxiety No date: Depression No date: Hypertension No date: Seizures [facility](HCC)     Comment: Febrile seizures as a child and undiagnosed               seizure on 01/2015   Reproductive/Obstetrics negative OB ROS                             Anesthesia Physical Anesthesia Plan  ASA: III  Anesthesia Plan: General   Post-op Pain Management:    Induction:   Airway Management Planned:   Additional Equipment:   Intra-op Plan:   Post-operative Plan:   Informed Consent: I have reviewed the patients History and Physical, chart, labs and discussed the procedure including the risks, benefits and alternatives for the proposed anesthesia with the patient or authorized representative who has indicated his/her understanding and acceptance.   Dental Advisory Given  Plan Discussed  with: Anesthesiologist, CRNA and Surgeon  Anesthesia Plan Comments:         Anesthesia Quick Evaluation

## 2016-10-23 NOTE — Op Note (Signed)
OPERATIVE REPORT  PREOPERATIVE DIAGNOSIS:  Chronic cholecystitis cholelithiasis  POSTOPERATIVE DIAGNOSIS: Chronic cholecystitis cholelithiasis  PROCEDURE: Laparoscopic cholecystectomy with cholangiogram  ANESTHESIA: General  SURGEON: Renda RollsWilton Brahim Dolman M.D.  INDICATIONS: She has history of right upper quadrant abdominal pains and ultrasound findings of a large gallstone.  With the patient on the operating table in the supine position under general endotracheal anesthesia the abdomen was prepared with ChloraPrep solution and draped in a sterile manner. A short incision was made in the inferior aspect of the umbilicus and carried down through a large thickness of fatty tissue to the deep fascia which was grasped with a laryngeal hook. A Veress needle was inserted aspirated and irrigated with a saline solution. The peritoneal cavity was insufflated with carbon dioxide. The Veress needle was removed. The 10 mm cannula was inserted. The 10 mm 0 laparoscope was inserted to view the peritoneal cavity.  Another incision was made in the epigastrium slightly to the right of the midline to introduce an 11 mm cannula. 2 incisions were made in the lateral aspect of the right upper quadrant to introduce 2   5 mm cannulas. Initial inspection revealed an appearance of fatty liver. There was also distention of the gallbladder. It appeared to have a thickened wall. Visible intestines appeared typical. The gallbladder was decompressed with a lancing needle revealing white bile. The gallbladder was retracted towards the right shoulder. Multiple adhesions were taken down with blunt and sharp dissection. The gallbladder neck was retracted inferiorly and laterally.  The porta hepatis was identified. The gallbladder was mobilized with incision of the visceral peritoneum. The cystic duct was dissected free from surrounding structures. The cystic artery was dissected free from surrounding structures. A critical view of safety was  demonstrated  An Endo Clip was placed across the cystic duct adjacent to the gallbladder neck. An incision was made in the cystic duct to introduce a Reddick catheter. The cholangiogram was done with injection of half-strength Conray 60 dye. This demonstrated the bile ducts and flow of dye into the duodenum. No retained stones were identified. The cholangiogram appeared normal. The Reddick catheter was removed. The cystic duct was doubly ligated with endoclips and divided. The cystic artery was controlled with double endoclips and divided. The gallbladder was dissected free from the liver with use of hook and cautery and blunt dissection. This was a somewhat tedious dissection due to inflammation surrounding the gallbladder. There was however a good plane of dissection. There was some bleeding during the course of dissection amounting to about 20 cc for the operation. There was one bleeding point at the mid aspect of the gallbladder bed which was controlled with endoclips. Hemostasis was subsequently intact.. The gallbladder was delivered up through the infraumbilical incision opened and suctioned.  There was a large stone in the gallbladder and multiple smaller stones. Multiple stones were removed with the stone forceps and was stone scoop. The larger stone could not be crushed. It was necessary to lengthen the incision to approximately 3 cm and dissected down to the deep fascia which was also incised sharply. With additional manipulation the gallbladder with a large approximately 3 cm stone was removed and was submitted in formalin for routine pathology. The fascia was then repaired with interrupted 0 Maxon figure-of-eight sutures leaving an opening for the 10 mm cannula which was reinserted. The right upper quadrant was further examined and could see hemostasis was intact. The site was irrigated with heparinized saline solution and aspirated. Next the cannulas removed and carbon  dioxide was allowed escape from  the peritoneal cavity. The remaining fascial defect at the umbilicus was closed with a 0 Maxon figure-of-eight suture. The skin incisions were closed with interrupted 5-0 chromic subcuticular suture benzoin and Steri-Strips. Gauze dressings were applied with paper tape.  The patient appeared to be in satisfactory condition and was prepared for transfer to the recovery room.  This operation was more difficult than the typical operation due to multiple factors including morbid obesity, inflammation in the gallbladder  related to a large stone impacted in the neck of the gallbladder, having to enlarge the fascial incision to allow removal of the large stone. The operation lasted 2 hours and 30 minutes.  Renda Rolls M.D.

## 2016-10-24 ENCOUNTER — Encounter: Payer: Self-pay | Admitting: Surgery

## 2016-10-27 LAB — SURGICAL PATHOLOGY

## 2017-01-03 ENCOUNTER — Emergency Department
Admission: EM | Admit: 2017-01-03 | Discharge: 2017-01-03 | Disposition: A | Discharge status: Home / Self Care | Payer: Medicaid Other | Attending: Emergency Medicine | Admitting: Emergency Medicine

## 2017-01-03 DIAGNOSIS — R1030 Lower abdominal pain, unspecified: Secondary | ICD-10-CM | POA: Insufficient documentation

## 2017-01-03 DIAGNOSIS — F17210 Nicotine dependence, cigarettes, uncomplicated: Secondary | ICD-10-CM | POA: Insufficient documentation

## 2017-01-03 DIAGNOSIS — Z79899 Other long term (current) drug therapy: Secondary | ICD-10-CM | POA: Insufficient documentation

## 2017-01-03 DIAGNOSIS — I10 Essential (primary) hypertension: Secondary | ICD-10-CM | POA: Insufficient documentation

## 2017-01-03 LAB — CBC
HCT: 36.3 % (ref 35.0–47.0)
Hemoglobin: 12 g/dL (ref 12.0–16.0)
MCH: 26.3 pg (ref 26.0–34.0)
MCHC: 33 g/dL (ref 32.0–36.0)
MCV: 79.8 fL — ABNORMAL LOW (ref 80.0–100.0)
PLATELETS: 344 10*3/uL (ref 150–440)
RBC: 4.55 MIL/uL (ref 3.80–5.20)
RDW: 14.2 % (ref 11.5–14.5)
WBC: 10.8 10*3/uL (ref 3.6–11.0)

## 2017-01-03 LAB — COMPREHENSIVE METABOLIC PANEL
ALK PHOS: 85 U/L (ref 38–126)
ALT: 35 U/L (ref 14–54)
AST: 28 U/L (ref 15–41)
Albumin: 4.2 g/dL (ref 3.5–5.0)
Anion gap: 7 (ref 5–15)
BILIRUBIN TOTAL: 0.3 mg/dL (ref 0.3–1.2)
BUN: 13 mg/dL (ref 6–20)
CALCIUM: 9.5 mg/dL (ref 8.9–10.3)
CO2: 26 mmol/L (ref 22–32)
CREATININE: 0.69 mg/dL (ref 0.44–1.00)
Chloride: 104 mmol/L (ref 101–111)
GFR calc non Af Amer: >60 mL/min (ref >60)
Glucose, Bld: 94 mg/dL (ref 65–99)
Potassium: 3.4 mmol/L — ABNORMAL LOW (ref 3.5–5.1)
SODIUM: 137 mmol/L (ref 135–145)
TOTAL PROTEIN: 8.1 g/dL (ref 6.5–8.1)

## 2017-01-03 LAB — URINALYSIS, COMPLETE (UACMP) WITH MICROSCOPIC
Bilirubin Urine: NEGATIVE
GLUCOSE, UA: NEGATIVE mg/dL
Ketones, ur: NEGATIVE mg/dL
Leukocytes, UA: NEGATIVE
NITRITE: NEGATIVE
PH: 6 (ref 5.0–8.0)
Protein, ur: NEGATIVE mg/dL
Specific Gravity, Urine: 1.02 (ref 1.005–1.030)

## 2017-01-03 LAB — WET PREP, GENITAL
Clue Cells Wet Prep HPF POC: NONE SEEN
Sperm: NONE SEEN
TRICH WET PREP: NONE SEEN
YEAST WET PREP: NONE SEEN

## 2017-01-03 LAB — CHLAMYDIA/NGC RT PCR (ARMC ONLY)
CHLAMYDIA TR: NOT DETECTED
N GONORRHOEAE: NOT DETECTED

## 2017-01-03 LAB — LIPASE, BLOOD: Lipase: 30 U/L (ref 11–51)

## 2017-01-03 LAB — POCT PREGNANCY, URINE: Preg Test, Ur: NEGATIVE

## 2017-01-03 LAB — PREGNANCY, URINE: Preg Test, Ur: NEGATIVE

## 2017-01-03 NOTE — ED Triage Notes (Signed)
Patient reports symptoms since Thursday, abdominal cramping and vaginal discharge (brownish/pink).

## 2017-01-03 NOTE — ED Notes (Signed)
Pt. Going home with husband. 

## 2017-01-03 NOTE — Discharge Instructions (Signed)
Please follow-up with [facility] that you used to see previously. Dr. Chauncey CruelStabler is on-call. They can further evaluate why you're having bleeding now.

## 2017-01-03 NOTE — ED Notes (Addendum)
Pt. States brownish vaginal discharge that started on Thursday.  Pt. States slight lower abdominal pain.  Pt. Denies N/V/D.

## 2017-01-03 NOTE — ED Provider Notes (Signed)
[facility] Emergency Department Provider Note   ____________________________________________   First MD Initiated Contact with Patient 01/03/17 [phone]     (approximate)  I have reviewed the triage vital signs and the nursing notes.   HISTORY  Chief Complaint Abdominal Pain    HPI Lori Henry is a 21 y.o. female who comes in complaining of some lower abdominal cramps and some brownish pinkish vaginal discharge. She says she never usually has menstrual. She hasn't had one for 2 years. He is unusual that she has is going on. She's had no fever no actual pain on palpation just the cramps and discharge. She is to see [facility] at least to give her pills make her have. Other than that she never had periods. Pain is not severe   Past Medical History:  Diagnosis Date  . Anxiety   . Depression   . Hypertension   . Seizures (HCC)    Febrile seizures as a child and undiagnosed seizure on 01/2015    There are no active problems to display for this patient.   Past Surgical History:  Procedure Laterality Date  . CHOLECYSTECTOMY N/A 10/23/2016   Procedure: LAPAROSCOPIC CHOLECYSTECTOMY WITH INTRAOPERATIVE CHOLANGIOGRAM;  Surgeon: Nadeen Landau, MD;  Location: [facility] ORS;  Service: General;  Laterality: N/A;  . MOUTH BIOPSY     benign  . MOUTH SURGERY    . WISDOM TOOTH EXTRACTION      Prior to Admission medications   Medication Sig Start Date End Date Taking? Authorizing Provider  calcium carbonate (TUMS - DOSED IN MG ELEMENTAL CALCIUM) 500 MG chewable tablet Chew 1 tablet by mouth daily as needed for indigestion or heartburn.    [provider]  escitalopram (LEXAPRO) 20 MG tablet Take 20 mg by mouth daily.    [provider]  HYDROcodone-acetaminophen (NORCO) 5-325 MG tablet Take 1-2 tablets by mouth every 4 (four) hours as needed for moderate pain. 10/23/16   Nadeen Landau, MD  ibuprofen (ADVIL,MOTRIN) 200 MG tablet Take  800 mg by mouth every 6 (six) hours as needed for mild pain.    [provider]  lisinopril (PRINIVIL,ZESTRIL) 5 MG tablet Take 5 mg by mouth daily.    [provider]  venlafaxine XR (EFFEXOR-XR) 150 MG 24 hr capsule Take 150 mg by mouth daily with breakfast.    [provider]    Allergies Bee venom  No family history on file.  Social History Social History  Substance Use Topics  . Smoking status: Light Tobacco Smoker    Packs/day: 0.25    Types: Cigarettes  . Smokeless tobacco: Never Used  . Alcohol use No    Review of Systems  Constitutional: No fever/chills Eyes: No visual changes. ENT: No sore throat. Cardiovascular: Denies chest pain. Respiratory: Denies shortness of breath. Gastrointestinal: No abdominal pain.  No nausea, no vomiting.  No diarrhea.  No constipation. Genitourinary: Negative for dysuria. Musculoskeletal: Negative for back pain. Skin: Negative for rash. Neurological: Negative for headaches, focal weakness or numbness.   ____________________________________________   PHYSICAL EXAM:  VITAL SIGNS: ED Triage Vitals  Enc Vitals Group     BP 01/03/17 0045 123/87     Pulse Rate 01/03/17 0045 94     Resp 01/03/17 0045 20     Temp 01/03/17 0045 98.5 F (36.9 C)     Temp Source 01/03/17 0045 Oral     SpO2 01/03/17 0045 100 %     Weight 01/03/17 0041  240 lb (108.9 kg)     Height 01/03/17 0041 5\' 4"  (1.626 m)     Head Circumference --      Peak Flow --      Pain Score 01/03/17 0041 7     Pain Loc --      Pain Edu? --      Excl. in GC? --     Constitutional: Alert and oriented. Well appearing and in no acute distress. Eyes: Conjunctivae are normal.  Head: Atraumatic. Nose: No congestion/rhinnorhea. Mouth/Throat: Mucous membranes are moist.  Oropharynx non-erythematous. Neck: No stridor. Cardiovascular: Normal rate, regular rhythm. Grossly normal heart sounds.  Good peripheral circulation. Respiratory: Normal  respiratory effort.  No retractions. Lungs CTAB. Gastrointestinal: Soft and nontender. No distention. No abdominal bruits. No CVA tenderness. Genitourinary:  Musculoskeletal: No lower extremity tenderness nor edema.  No joint effusions. Neurologic:  Normal speech and language. No gross focal neurologic deficits are appreciated. No gait instability. Skin:  Skin is warm, dry and intact. No rash noted. Psychiatric: Mood and affect are normal. Speech and behavior are normal.  ____________________________________________   LABS (all labs ordered are listed, but only abnormal results are displayed)  Labs Reviewed  WET PREP, GENITAL - Abnormal; Notable for the following:       Result Value   WBC, Wet Prep HPF POC FEW (*)    All other components within normal limits  COMPREHENSIVE METABOLIC PANEL - Abnormal; Notable for the following:    Potassium 3.4 (*)    All other components within normal limits  CBC - Abnormal; Notable for the following:    MCV 79.8 (*)    All other components within normal limits  URINALYSIS, COMPLETE (UACMP) WITH MICROSCOPIC - Abnormal; Notable for the following:    Color, Urine YELLOW (*)    APPearance CLEAR (*)    Hgb urine dipstick MODERATE (*)    Bacteria, UA RARE (*)    Squamous Epithelial / LPF 0-5 (*)    All other components within normal limits  CHLAMYDIA/NGC RT PCR ([facility] ONLY)  LIPASE, BLOOD  PREGNANCY, URINE  POC URINE PREG, ED  POCT PREGNANCY, URINE   ____________________________________________  EKG   ____________________________________________  RADIOLOGY  No results found.  ____________________________________________   PROCEDURES  Procedure(s) performed:   Procedures  Critical Care performed:   ____________________________________________   INITIAL IMPRESSION / ASSESSMENT AND PLAN / ED COURSE  Pertinent labs & imaging results that were available during my care of the patient were reviewed by me and considered in  my medical decision making (see chart for details).   Uncertain why patient is HAVING BLEEDING NOW. PATIENT TOLD THE NURSE SHE WAS THINKING THAT HER IUD MIGHT BE LEFT IN. HE DID NOT SEE THE STRING AT ALL BUT THEY COULD BE A POSSIBILITY. I HAVE REFERRED HER BACK TO [facility] WHERE SHE IS TO GO.     ____________________________________________   FINAL CLINICAL IMPRESSION(S) / ED DIAGNOSES  Final diagnoses:  Lower abdominal pain      NEW MEDICATIONS STARTED DURING THIS VISIT:  New Prescriptions   No medications on file     Note:  This document was prepared using Dragon voice recognition software and may include unintentional dictation errors.    Arnaldo NatalMalinda, Arlie Posch F, MD 01/03/17 0630

## 2017-01-15 ENCOUNTER — Encounter: Payer: Self-pay | Admitting: *Deleted

## 2017-01-15 ENCOUNTER — Ambulatory Visit
Admission: EM | Admit: 2017-01-15 | Discharge: 2017-01-15 | Disposition: A | Discharge status: Home / Self Care | Payer: Medicaid Other | Attending: Emergency Medicine | Admitting: Emergency Medicine

## 2017-01-15 DIAGNOSIS — R112 Nausea with vomiting, unspecified: Secondary | ICD-10-CM

## 2017-01-15 MED ORDER — ONDANSETRON 8 MG PO TBDP: 8.0000 mg | ORAL_TABLET | Freq: Two times a day (BID) | ORAL | 0 refills | Status: DC

## 2017-01-15 NOTE — ED Provider Notes (Signed)
CSN: [id number]     Arrival date & time 01/15/17  1925 History   First MD Initiated Contact with Patient 01/15/17 1959     Chief Complaint  Patient presents with  . Headache  . Dizziness  . Nausea  . Emesis   (Consider location/radiation/quality/duration/timing/severity/associated sxs/prior Treatment) HPI  This a 21 year old female who started having a headache yesterday. She took ibuprofen with partial relief. This morning awoke with a headache worse and also had nausea followed by vomiting which has persisted throughout the day. Had 4 episodes of vomitus her last one at 3:30 this afternoon. OB/GYN appointment on Tuesday with the GYN because of abnormal vaginal bleeding. This was worked up in the ER last Thursday. Pelvic exam did not show anything. She's had no diarrhea nor any abdominal pain. She'left work early today.        Past Medical History:  Diagnosis Date  . Anxiety   . Depression   . Hypertension   . Seizures (HCC)    Febrile seizures as a child and undiagnosed seizure on 01/2015   Past Surgical History:  Procedure Laterality Date  . CHOLECYSTECTOMY N/A 10/23/2016   Procedure: LAPAROSCOPIC CHOLECYSTECTOMY WITH INTRAOPERATIVE CHOLANGIOGRAM;  Surgeon: Nadeen Landau, MD;  Location: [facility] ORS;  Service: General;  Laterality: N/A;  . MOUTH BIOPSY     benign  . MOUTH SURGERY    . WISDOM TOOTH EXTRACTION     History reviewed. No pertinent family history. Social History  Substance Use Topics  . Smoking status: Light Tobacco Smoker    Packs/day: 0.25    Types: Cigarettes  . Smokeless tobacco: Never Used  . Alcohol use No   OB History    No data available     Review of Systems  Constitutional: Positive for activity change and appetite change. Negative for chills, fatigue and fever.  Gastrointestinal: Positive for constipation, nausea and vomiting. Negative for abdominal pain.  All other systems reviewed and are negative.   Allergies  Bee venom  Home  Medications   Prior to Admission medications   Medication Sig Start Date End Date Taking? Authorizing Provider  ibuprofen (ADVIL,MOTRIN) 200 MG tablet Take 800 mg by mouth every 6 (six) hours as needed for mild pain.   Yes [provider]  calcium carbonate (TUMS - DOSED IN MG ELEMENTAL CALCIUM) 500 MG chewable tablet Chew 1 tablet by mouth daily as needed for indigestion or heartburn.    [provider]  escitalopram (LEXAPRO) 20 MG tablet Take 20 mg by mouth daily.    [provider]  HYDROcodone-acetaminophen (NORCO) 5-325 MG tablet Take 1-2 tablets by mouth every 4 (four) hours as needed for moderate pain. 10/23/16   Nadeen Landau, MD  lisinopril (PRINIVIL,ZESTRIL) 5 MG tablet Take 5 mg by mouth daily.    [provider]  ondansetron (ZOFRAN ODT) 8 MG disintegrating tablet Take 1 tablet (8 mg total) by mouth 2 (two) times daily. 01/15/17   Lutricia Feil, PA-C  venlafaxine XR (EFFEXOR-XR) 150 MG 24 hr capsule Take 150 mg by mouth daily with breakfast.    [provider]   Meds Ordered and Administered this Visit  Medications - No data to display  BP (!) 134/96 (BP Location: Left Arm)   Pulse 90   Temp 98.6 F (37 C) (Oral)   Resp 16   Ht 5\' 4"  (1.626 m)   Wt 250 lb (113.4 kg)   SpO2 100%   BMI 42.91 kg/m  No data found.   Physical Exam  Constitutional: She is oriented to person, place, and time. She appears well-developed and well-nourished. No distress.  HENT:  Head: Normocephalic and atraumatic.  Right Ear: External ear normal.  Left Ear: External ear normal.  Nose: Nose normal.  Mouth/Throat: Oropharynx is clear and moist. No oropharyngeal exudate.  Eyes: Pupils are equal, round, and reactive to light. Right eye exhibits no discharge. Left eye exhibits no discharge.  Neck: Normal range of motion.  Pulmonary/Chest: Effort normal and breath sounds normal.  Abdominal: Soft. Bowel sounds are normal. She exhibits no  distension. There is no tenderness. There is no rebound and no guarding.  Musculoskeletal: Normal range of motion.  Lymphadenopathy:    She has no cervical adenopathy.  Neurological: She is alert and oriented to person, place, and time.  Skin: Skin is warm and dry. She is not diaphoretic.  Psychiatric: She has a normal mood and affect. Her behavior is normal. Judgment and thought content normal.  Nursing note and vitals reviewed.   Urgent Care Course     Procedures (including critical care time)  Labs Review Labs Reviewed - No data to display  Imaging Review No results found.   Visual Acuity Review  Right Eye Distance:   Left Eye Distance:   Bilateral Distance:    Right Eye Near:   Left Eye Near:    Bilateral Near:         MDM   1. Non-intractable vomiting with nausea, unspecified vomiting type    Discharge Medication List as of 01/15/2017  8:19 PM    START taking these medications   Details  ondansetron (ZOFRAN ODT) 8 MG disintegrating tablet Take 1 tablet (8 mg total) by mouth 2 (two) times daily., Starting Fri 01/15/2017, Normal      Plan: 1. Test/x-ray results and diagnosis reviewed with patient 2. rx as per orders; risks, benefits, potential side effects reviewed with patient 3. Recommend supportive treatment with Zofran for nausea. Increase fluid intake. Cannot find a focus today for the nausea and vomiting. If she develops any stomach pain or diarrhea along with the nausea and vomiting she should go to her primary care physician or to the emergency room. She will use ibuprofen for her headache as long as she is not vomiting. To keep her appointment with [facility] on Tuesday. 4. F/u prn if symptoms worsen or don't improve     Lutricia FeilRoemer, Orlin Kann P, PA-C 01/15/17 2029

## 2017-01-15 NOTE — ED Triage Notes (Signed)
Onset of a headache yesterday evening, which she took  Ibuprofen with partial relief, worse this morning with N/V and has persisted throughout day. Pt states her last period was 2 years ago, [facility] Thursday with a pelvic exam done then. C/o irregular vaginal bleeding, has an appt with her OB/GYN next week.

## 2017-01-19 ENCOUNTER — Encounter: Payer: Self-pay | Admitting: Obstetrics & Gynecology

## 2017-02-18 ENCOUNTER — Ambulatory Visit (INDEPENDENT_AMBULATORY_CARE_PROVIDER_SITE_OTHER)
Admit: 2017-02-18 | Discharge: 2017-02-18 | Disposition: A | Discharge status: Home / Self Care | Payer: Self-pay | Attending: Emergency Medicine | Admitting: Emergency Medicine

## 2017-02-18 ENCOUNTER — Ambulatory Visit
Admission: EM | Admit: 2017-02-18 | Discharge: 2017-02-18 | Disposition: A | Discharge status: Home / Self Care | Payer: Self-pay | Attending: Family Medicine | Admitting: Family Medicine

## 2017-02-18 ENCOUNTER — Encounter: Payer: Self-pay | Admitting: *Deleted

## 2017-02-18 DIAGNOSIS — R109 Unspecified abdominal pain: Secondary | ICD-10-CM

## 2017-02-18 DIAGNOSIS — N200 Calculus of kidney: Secondary | ICD-10-CM

## 2017-02-18 DIAGNOSIS — R1110 Vomiting, unspecified: Secondary | ICD-10-CM

## 2017-02-18 LAB — URINALYSIS, COMPLETE (UACMP) WITH MICROSCOPIC
BILIRUBIN URINE: NEGATIVE
Glucose, UA: NEGATIVE mg/dL
LEUKOCYTES UA: NEGATIVE
NITRITE: NEGATIVE
PROTEIN: 30 mg/dL — AB
Specific Gravity, Urine: 1.02 (ref 1.005–1.030)
pH: 8.5 — ABNORMAL HIGH (ref 5.0–8.0)

## 2017-02-18 LAB — PREGNANCY, URINE: Preg Test, Ur: NEGATIVE

## 2017-02-18 MED ORDER — HYDROCODONE-ACETAMINOPHEN 5-325 MG PO TABS: 1.0000 | ORAL_TABLET | Freq: Four times a day (QID) | ORAL | 0 refills | Status: DC | PRN

## 2017-02-18 NOTE — ED Provider Notes (Addendum)
CSN: [id number]     Arrival date & time 02/18/17  1248 History   None    Chief Complaint  Patient presents with  . Abdominal Pain  . Emesis   (Consider location/radiation/quality/duration/timing/severity/associated sxs/prior Treatment) HPI  This a 21 year old female who is known to our practice. She presents with the sudden onset this morning of right back flank and abdominal pain along with vomiting. She's had a cholecystectomy in the past. Very irregular periods and she was concerned that she may be pregnant. When last seen in our clinic on 01/15/2017 he had non-intractable nausea and vomiting and was scheduled for a follow-up at [facility] the following Tuesday. She never kept that appointment because she can't afford it. States she has had 3 episodes of vomiting this morning. She also had a normal bowel movement this morning was slightly loose but no blood or mucus. She states that this is normal for her. His had no fever or chills. His had normal vaginal discharge. Not noticed any blood in her urine.       Past Medical History:  Diagnosis Date  . Anxiety   . Depression   . Hypertension   . Seizures (HCC)    Febrile seizures as a child and undiagnosed seizure on 01/2015   Past Surgical History:  Procedure Laterality Date  . CHOLECYSTECTOMY N/A 10/23/2016   Procedure: LAPAROSCOPIC CHOLECYSTECTOMY WITH INTRAOPERATIVE CHOLANGIOGRAM;  Surgeon: Nadeen Landau, MD;  Location: [facility] ORS;  Service: General;  Laterality: N/A;  . MOUTH BIOPSY     benign  . MOUTH SURGERY    . WISDOM TOOTH EXTRACTION     History reviewed. No pertinent family history. Social History  Substance Use Topics  . Smoking status: Light Tobacco Smoker    Packs/day: 0.25    Types: Cigarettes  . Smokeless tobacco: Never Used  . Alcohol use No   OB History    No data available     Review of Systems  Constitutional: Positive for activity change. Negative for appetite change, fatigue and fever.   Genitourinary: Positive for flank pain and vaginal discharge. Negative for dysuria.  All other systems reviewed and are negative.   Allergies  Bee venom  Home Medications   Prior to Admission medications   Medication Sig Start Date End Date Taking? Authorizing Provider  calcium carbonate (TUMS - DOSED IN MG ELEMENTAL CALCIUM) 500 MG chewable tablet Chew 1 tablet by mouth daily as needed for indigestion or heartburn.    [provider]  escitalopram (LEXAPRO) 20 MG tablet Take 20 mg by mouth daily.    [provider]  HYDROcodone-acetaminophen (NORCO/VICODIN) 5-325 MG tablet Take 1-2 tablets by mouth every 6 (six) hours as needed for severe pain. 02/18/17   Lutricia Feil, PA-C  ibuprofen (ADVIL,MOTRIN) 200 MG tablet Take 800 mg by mouth every 6 (six) hours as needed for mild pain.    [provider]  lisinopril (PRINIVIL,ZESTRIL) 5 MG tablet Take 5 mg by mouth daily.    [provider]  ondansetron (ZOFRAN ODT) 8 MG disintegrating tablet Take 1 tablet (8 mg total) by mouth 2 (two) times daily. 01/15/17   Lutricia Feil, PA-C  venlafaxine XR (EFFEXOR-XR) 150 MG 24 hr capsule Take 150 mg by mouth daily with breakfast.    [provider]   Meds Ordered and Administered this Visit  Medications - No data to display  BP 139/90 (BP Location: Left Arm)   Pulse 79   Temp 98.7 F (37.1  C) (Oral)   Resp 16   LMP 12/31/2016   SpO2 98%  No data found.   Physical Exam  Constitutional: She is oriented to person, place, and time. She appears well-developed and well-nourished. No distress.  HENT:  Head: Normocephalic.  Eyes: Pupils are equal, round, and reactive to light.  Neck: Normal range of motion.  Pulmonary/Chest: Effort normal and breath sounds normal.  Abdominal: Soft. Bowel sounds are normal. She exhibits no distension. There is tenderness. There is no rebound and no guarding.  She has a right-sided CVA tenderness. Chest has diffuse  tenderness on the right upper mid quadrant. There is no rebound or guarding present.  Musculoskeletal: Normal range of motion.  Neurological: She is alert and oriented to person, place, and time.  Skin: Skin is warm and dry. She is not diaphoretic.  Psychiatric: She has a normal mood and affect. Her behavior is normal. Judgment and thought content normal.  Nursing note and vitals reviewed.   Urgent Care Course     Procedures (including critical care time)  Labs Review Labs Reviewed  URINALYSIS, COMPLETE (UACMP) WITH MICROSCOPIC - Abnormal; Notable for the following:       Result Value   APPearance CLOUDY (*)    pH 8.5 (*)    Hgb urine dipstick LARGE (*)    Ketones, ur TRACE (*)    Protein, ur 30 (*)    Squamous Epithelial / LPF 6-30 (*)    Bacteria, UA MANY (*)    All other components within normal limits  PREGNANCY, URINE    Imaging Review Ct Abdomen Pelvis Wo Contrast  Result Date: 02/18/2017 CLINICAL DATA:  Right flank and abdominal pain with vomiting and back pain beginning today. Microhematuria. EXAM: CT ABDOMEN AND PELVIS WITHOUT CONTRAST TECHNIQUE: Multidetector CT imaging of the abdomen and pelvis was performed following the standard protocol without IV contrast. COMPARISON:  None. FINDINGS: Lower chest: Clear lung bases. Hepatobiliary: Diffuse decreased attenuation of the liver consistent with fatty infiltration. Liver normal in size. No mass or focal lesion. Status post cholecystectomy. No bile duct dilation. Pancreas: Unremarkable. No pancreatic ductal dilatation or surrounding inflammatory changes. Spleen: Normal in size without focal abnormality. Adrenals/Urinary Tract: No adrenal masses. Single right and 2 small left intrarenal stones. No renal masses. Mild dilation of the right renal pelvis and proximal ureter. This is due to a 2-3 mm stone in the proximal to mid right ureter. No other ureteral stones. No left hydronephrosis. Left ureter is normal in caliber. Bladder is  unremarkable. Stomach/Bowel: Stomach is within normal limits. Appendix appears normal. No evidence of bowel wall thickening, distention, or inflammatory changes. Vascular/Lymphatic: No significant vascular findings are present. No enlarged abdominal or pelvic lymph nodes. Reproductive: Uterus and bilateral adnexa are unremarkable. Other: No abdominal wall hernia or abnormality. No abdominopelvic ascites. Musculoskeletal: No acute or significant osseous findings. IMPRESSION: 1. 2-3 mm stone in the proximal to mid right ureter. This causes mild dilation of the right renal pelvis and proximal ureter. 2. No other acute finding. 3. Small bilateral intrarenal stones. 4. Hepatic steatosis. 5. Status post cholecystectomy. Electronically Signed   By: Amie Portland M.D.   On: 02/18/2017 14:23     Visual Acuity Review  Right Eye Distance:   Left Eye Distance:   Bilateral Distance:    Right Eye Near:   Left Eye Near:    Bilateral Near:     Patient's current pain is characterized as 3-4 out of 10. During her back pain and  flank pain earlier today which caused her vomiting was greater than 10 at that time.    MDM   1. Kidney stones    Discharge Medication List as of 02/18/2017  2:47 PM    Plan: 1. Test/x-ray results and diagnosis reviewed with patient 2. rx as per orders; risks, benefits, potential side effects reviewed with patient 3. Recommend supportive treatment with Use of a urine screen to catch any of stones that may pass. Recommended increasing her fluid. Encouraged to use ibuprofen as necessary for pain but have provided her with narcotic analgesia in case pain becomes very severe. [place] for any narcotics. She should follow-up with her primary care physician or urologist if she is not improving. If she is worse today or in the next couple days she should go immediately to emergency room. 4. F/u prn if symptoms worsen or don't improve      Lutricia FeilRoemer, William P, PA-C 02/18/17 1457    Lutricia Feiloemer, William P, PA-C 02/18/17 1458

## 2017-02-18 NOTE — ED Triage Notes (Signed)
Patient started having symptoms of abdominal pain, vomiting and back pain today.

## 2017-04-11 ENCOUNTER — Emergency Department
Admission: EM | Admit: 2017-04-11 | Discharge: 2017-04-11 | Disposition: A | Discharge status: Home / Self Care | Payer: Self-pay | Attending: Emergency Medicine | Admitting: Emergency Medicine

## 2017-04-11 ENCOUNTER — Emergency Department: Payer: Self-pay

## 2017-04-11 DIAGNOSIS — F17210 Nicotine dependence, cigarettes, uncomplicated: Secondary | ICD-10-CM | POA: Insufficient documentation

## 2017-04-11 DIAGNOSIS — I10 Essential (primary) hypertension: Secondary | ICD-10-CM | POA: Insufficient documentation

## 2017-04-11 DIAGNOSIS — M25571 Pain in right ankle and joints of right foot: Secondary | ICD-10-CM | POA: Insufficient documentation

## 2017-04-11 DIAGNOSIS — Z79899 Other long term (current) drug therapy: Secondary | ICD-10-CM | POA: Insufficient documentation

## 2017-04-11 MED ORDER — MELOXICAM 7.5 MG PO TABS: 7.5000 mg | ORAL_TABLET | Freq: Every day | ORAL | 1 refills | Status: AC

## 2017-04-11 NOTE — ED Provider Notes (Signed)
[facility] Emergency Department Provider Note  ____________________________________________  Time seen: Approximately 4:46 PM  I have reviewed the triage vital signs and the nursing notes.   HISTORY  Chief Complaint Ankle Pain    HPI Lori Henry is a 21 y.o. female presents to the emergency department with 10 out of 10 right ankle pain after patient tripped against a safe door and sustained an inversion type injury. Patient denies radiculopathy, weakness or changes in sensation of the lower extremities. She states that she can walk with some difficulty. No alleviating measures were attempted prior to presenting to the emergency department.  Past Medical History:  Diagnosis Date  . Anxiety   . Depression   . Hypertension   . Seizures (HCC)    Febrile seizures as a child and undiagnosed seizure on 01/2015    There are no active problems to display for this patient.   Past Surgical History:  Procedure Laterality Date  . CHOLECYSTECTOMY N/A 10/23/2016   Procedure: LAPAROSCOPIC CHOLECYSTECTOMY WITH INTRAOPERATIVE CHOLANGIOGRAM;  Surgeon: Nadeen Landau, MD;  Location: [facility] ORS;  Service: General;  Laterality: N/A;  . MOUTH BIOPSY     benign  . MOUTH SURGERY    . WISDOM TOOTH EXTRACTION      Prior to Admission medications   Medication Sig Start Date End Date Taking? Authorizing Provider  calcium carbonate (TUMS - DOSED IN MG ELEMENTAL CALCIUM) 500 MG chewable tablet Chew 1 tablet by mouth daily as needed for indigestion or heartburn.    [provider]  escitalopram (LEXAPRO) 20 MG tablet Take 20 mg by mouth daily.    [provider]  HYDROcodone-acetaminophen (NORCO/VICODIN) 5-325 MG tablet Take 1-2 tablets by mouth every 6 (six) hours as needed for severe pain. 02/18/17   Lutricia Feil, PA-C  ibuprofen (ADVIL,MOTRIN) 200 MG tablet Take 800 mg by mouth every 6 (six) hours as needed for mild pain.    [provider]  lisinopril (PRINIVIL,ZESTRIL) 5 MG tablet Take 5 mg by mouth daily.    [provider]  meloxicam (MOBIC) 7.5 MG tablet Take 1 tablet (7.5 mg total) by mouth daily. 04/11/17 04/18/17  Orvil Feil, PA-C  ondansetron (ZOFRAN ODT) 8 MG disintegrating tablet Take 1 tablet (8 mg total) by mouth 2 (two) times daily. 01/15/17   Lutricia Feil, PA-C  venlafaxine XR (EFFEXOR-XR) 150 MG 24 hr capsule Take 150 mg by mouth daily with breakfast.    [provider]    Allergies Bee venom  No family history on file.  Social History Social History  Substance Use Topics  . Smoking status: Light Tobacco Smoker    Packs/day: 0.25    Types: Cigarettes  . Smokeless tobacco: Never Used  . Alcohol use No     Review of Systems  Constitutional: No fever/chills Eyes: No visual changes. No discharge ENT: No upper respiratory complaints. Cardiovascular: no chest pain. Respiratory: no cough. No SOB. Musculoskeletal: Patient has right ankle pain.  Skin: Negative for rash, abrasions, lacerations, ecchymosis. Neurological: Negative for headaches, focal weakness or numbness.   ____________________________________________   PHYSICAL EXAM:  VITAL SIGNS: ED Triage Vitals  Enc Vitals Group     BP 04/11/17 1538 (!) 136/96     Pulse Rate 04/11/17 1538 93     Resp 04/11/17 1538 18     Temp 04/11/17 1538 98.6 F (37 C)     Temp Source 04/11/17 1538 Oral     SpO2 04/11/17 1538  97 %     Weight 04/11/17 1539 245 lb (111.1 kg)     Height 04/11/17 1539  (1.651 m)     Head Circumference --      Peak Flow --      Pain Score 04/11/17 1537 9     Pain Loc --      Pain Edu? --      Excl. in GC? --      Constitutional: Alert and oriented. Well appearing and in no acute distress. Eyes: Conjunctivae are normal. PERRL. EOMI. Head: Atraumatic. Cardiovascular: Normal rate, regular rhythm. Normal S1 and S2.  Good peripheral circulation. Respiratory: Normal respiratory effort  without tachypnea or retractions. Lungs CTAB. Good air entry to the bases with no decreased or absent breath sounds. Musculoskeletal: She is able to perform limited flexion and extension at the right ankle, likely secondary to pain. Patient is able to move all 5 right toes. Palpable dorsalis pedis pulse, right. Neurologic:  Normal speech and language. No gross focal neurologic deficits are appreciated.  Skin:  Skin is warm, dry and intact. No rash noted. Psychiatric: Mood and affect are normal. Speech and behavior are normal. Patient exhibits appropriate insight and judgement.   ____________________________________________   LABS (all labs ordered are listed, but only abnormal results are displayed)  Labs Reviewed - No data to display ____________________________________________  EKG   ____________________________________________  RADIOLOGY Geraldo Pitter, personally viewed and evaluated these images (plain radiographs) as part of my medical decision making, as well as reviewing the written report by the radiologist.  Dg Ankle Complete Right  Result Date: 04/11/2017 CLINICAL DATA:  Right ankle twisting injury yesterday. Right ankle pain. Foot numbness. Initial encounter. EXAM: RIGHT ANKLE - COMPLETE 3+ VIEW COMPARISON:  12/03/2011 FINDINGS: No fracture, dislocation, or focal osseous lesion is seen. There is mild-to-moderate soft tissue swelling diffusely about the ankle. IMPRESSION: Soft tissue swelling without acute osseous abnormality. Electronically Signed   By: Sebastian Ache M.D.   On: 04/11/2017 16:34    ____________________________________________    PROCEDURES  Procedure(s) performed:    Procedures    Medications - No data to display   ____________________________________________   INITIAL IMPRESSION / ASSESSMENT AND PLAN / ED COURSE  Pertinent labs & imaging results that were available during my care of the patient were reviewed by me and considered in my  medical decision making (see chart for details).  Review of the [place] CSRS was performed in accordance of the [facility] prior to dispensing any controlled drugs.     Assessment and plan Right ankle pain Patient presents to the emergency department with right ankle pain after sustaining and inversion type right ankle injury. X-ray examination reveals no acute fractures or bony abnormalities. An ace wrap was applied in the emergency department. Ice, compression, elevation were recommended. A referral was made to podiatry, Dr. Orland Jarred. Vital signs are reassuring prior to discharge. All patient questions were answered.   ____________________________________________  FINAL CLINICAL IMPRESSION(S) / ED DIAGNOSES  Final diagnoses:  Acute right ankle pain      NEW MEDICATIONS STARTED DURING THIS VISIT:  New Prescriptions   MELOXICAM (MOBIC) 7.5 MG TABLET    Take 1 tablet (7.5 mg total) by mouth daily.        This chart was dictated using voice recognition software/Dragon. Despite best efforts to proofread, errors can occur which can change the meaning. Any change was purely unintentional.    Orvil Feil, PA-C 04/11/17 1651  Jeanmarie PlantMcShane, James A, MD 04/11/17 2221

## 2017-04-11 NOTE — ED Triage Notes (Signed)
Pt here with significant other with rt ankle pain, pt states that she tripped at work yesterday and fell injuring her rt ankle, uncertain at this time if she wants to file worker's comp

## 2017-04-11 NOTE — ED Notes (Signed)

## 2017-06-29 ENCOUNTER — Emergency Department
Admission: EM | Admit: 2017-06-29 | Discharge: 2017-06-29 | Disposition: A | Discharge status: Home / Self Care | Payer: Self-pay | Attending: Emergency Medicine | Admitting: Emergency Medicine

## 2017-06-29 ENCOUNTER — Emergency Department: Payer: Self-pay

## 2017-06-29 ENCOUNTER — Other Ambulatory Visit: Payer: Self-pay

## 2017-06-29 DIAGNOSIS — F17210 Nicotine dependence, cigarettes, uncomplicated: Secondary | ICD-10-CM | POA: Insufficient documentation

## 2017-06-29 DIAGNOSIS — Z79899 Other long term (current) drug therapy: Secondary | ICD-10-CM | POA: Insufficient documentation

## 2017-06-29 DIAGNOSIS — G43909 Migraine, unspecified, not intractable, without status migrainosus: Secondary | ICD-10-CM | POA: Insufficient documentation

## 2017-06-29 DIAGNOSIS — I10 Essential (primary) hypertension: Secondary | ICD-10-CM | POA: Insufficient documentation

## 2017-06-29 DIAGNOSIS — R569 Unspecified convulsions: Secondary | ICD-10-CM | POA: Insufficient documentation

## 2017-06-29 LAB — COMPREHENSIVE METABOLIC PANEL
ALBUMIN: 3.8 g/dL (ref 3.5–5.0)
ALT: 40 U/L (ref 14–54)
ANION GAP: 6 (ref 5–15)
AST: 28 U/L (ref 15–41)
Alkaline Phosphatase: 92 U/L (ref 38–126)
BILIRUBIN TOTAL: 0.5 mg/dL (ref 0.3–1.2)
BUN: 7 mg/dL (ref 6–20)
CHLORIDE: 106 mmol/L (ref 101–111)
CO2: 25 mmol/L (ref 22–32)
Calcium: 9.1 mg/dL (ref 8.9–10.3)
Creatinine, Ser: 0.58 mg/dL (ref 0.44–1.00)
GFR calc Af Amer: >60 mL/min (ref >60)
GLUCOSE: 133 mg/dL — AB (ref 65–99)
POTASSIUM: 3.4 mmol/L — AB (ref 3.5–5.1)
SODIUM: 137 mmol/L (ref 135–145)
TOTAL PROTEIN: 7.7 g/dL (ref 6.5–8.1)

## 2017-06-29 LAB — CBC
HEMATOCRIT: 38 % (ref 35.0–47.0)
HEMOGLOBIN: 12.6 g/dL (ref 12.0–16.0)
MCH: 26.2 pg (ref 26.0–34.0)
MCHC: 33.1 g/dL (ref 32.0–36.0)
MCV: 79 fL — ABNORMAL LOW (ref 80.0–100.0)
Platelets: 330 10*3/uL (ref 150–440)
RBC: 4.82 MIL/uL (ref 3.80–5.20)
RDW: 14.1 % (ref 11.5–14.5)
WBC: 12.2 10*3/uL — ABNORMAL HIGH (ref 3.6–11.0)

## 2017-06-29 LAB — URINALYSIS, COMPLETE (UACMP) WITH MICROSCOPIC
BILIRUBIN URINE: NEGATIVE
GLUCOSE, UA: NEGATIVE mg/dL
Hgb urine dipstick: NEGATIVE
KETONES UR: NEGATIVE mg/dL
LEUKOCYTES UA: NEGATIVE
Nitrite: NEGATIVE
PROTEIN: NEGATIVE mg/dL
Specific Gravity, Urine: 1.017 (ref 1.005–1.030)
pH: 7 (ref 5.0–8.0)

## 2017-06-29 LAB — POCT PREGNANCY, URINE: Preg Test, Ur: NEGATIVE

## 2017-06-29 MED ORDER — METOCLOPRAMIDE HCL 5 MG/ML IJ SOLN
10.0000 mg | Freq: Once | INTRAMUSCULAR | Status: AC
  Administered 2017-06-29: 10 mg via INTRAVENOUS
  Filled 2017-06-29: qty 2

## 2017-06-29 MED ORDER — SODIUM CHLORIDE 0.9 % IV BOLUS (SEPSIS)
1000.0000 mL | Freq: Once | INTRAVENOUS | Status: AC
  Administered 2017-06-29: 1000 mL via INTRAVENOUS

## 2017-06-29 MED ORDER — KETOROLAC TROMETHAMINE 30 MG/ML IJ SOLN
30.0000 mg | Freq: Once | INTRAMUSCULAR | Status: AC
  Administered 2017-06-29: 30 mg via INTRAVENOUS
  Filled 2017-06-29: qty 1

## 2017-06-29 MED ORDER — DIPHENHYDRAMINE HCL 50 MG/ML IJ SOLN
50.0000 mg | Freq: Once | INTRAMUSCULAR | Status: AC
  Administered 2017-06-29: 50 mg via INTRAVENOUS
  Filled 2017-06-29: qty 1

## 2017-06-29 NOTE — ED Provider Notes (Signed)
[facility] Emergency Department Provider Note  Time seen: 3:34 AM  I have reviewed the triage vital signs and the nursing notes.   HISTORY  Chief Complaint Headache    HPI Lori Henry is a 21 y.o. female with a past medical history of anxiety, depression, hypertension, seizure disorder, presents to the emergency department after an episode of unconsciousness.  According to the patient she has frequent headaches but today she was having a headache more severe than normal.  Patient states usually she can lay down and close her eyes and the headache will go away.  Tonight she attempted to take a nap but the headache remained.  Around midnight tonight she got up from her nap to go pick up her significant other from work.  States next thing she remembers is waking up on the ground approximately 20 minutes later.  Patient states the last time this happened was approximately 4 years ago when she was 17 she had a seizure.  Patient states a history of seizures in the past but is no longer taking medications.  Patient continues to state a headache which she describes as a 10/10.  Denies any weakness or numbness.  Denies any fever.  Largely negative review of systems including chest pain, abdominal pain, nausea, vomiting, diarrhea, dysuria.   Past Medical History:  Diagnosis Date  . Anxiety   . Depression   . Hypertension   . Seizures (HCC)    Febrile seizures as a child and undiagnosed seizure on 01/2015    There are no active problems to display for this patient.   Past Surgical History:  Procedure Laterality Date  . CHOLECYSTECTOMY N/A 10/23/2016   Procedure: LAPAROSCOPIC CHOLECYSTECTOMY WITH INTRAOPERATIVE CHOLANGIOGRAM;  Surgeon: Nadeen LandauJarvis Wilton Smith, MD;  Location: [facility] ORS;  Service: General;  Laterality: N/A;  . MOUTH BIOPSY     benign  . MOUTH SURGERY    . WISDOM TOOTH EXTRACTION      Prior to Admission medications   Medication Sig Start Date End  Date Taking? Authorizing Provider  calcium carbonate (TUMS - DOSED IN MG ELEMENTAL CALCIUM) 500 MG chewable tablet Chew 1 tablet by mouth daily as needed for indigestion or heartburn.    [provider]  escitalopram (LEXAPRO) 20 MG tablet Take 20 mg by mouth daily.    [provider]  HYDROcodone-acetaminophen (NORCO/VICODIN) 5-325 MG tablet Take 1-2 tablets by mouth every 6 (six) hours as needed for severe pain. 02/18/17   Lutricia Feiloemer, William P, PA-C  ibuprofen (ADVIL,MOTRIN) 200 MG tablet Take 800 mg by mouth every 6 (six) hours as needed for mild pain.    [provider]  lisinopril (PRINIVIL,ZESTRIL) 5 MG tablet Take 5 mg by mouth daily.    [provider]  ondansetron (ZOFRAN ODT) 8 MG disintegrating tablet Take 1 tablet (8 mg total) by mouth 2 (two) times daily. 01/15/17   Lutricia Feiloemer, William P, PA-C  venlafaxine XR (EFFEXOR-XR) 150 MG 24 hr capsule Take 150 mg by mouth daily with breakfast.    [provider]    Allergies  Allergen Reactions  . Bee Venom Anaphylaxis    No family history on file.  Social History Social History   Tobacco Use  . Smoking status: Light Tobacco Smoker    Packs/day: 0.25    Types: Cigarettes  . Smokeless tobacco: Never Used  Substance Use Topics  . Alcohol use: No  . Drug use: No    Review of Systems Constitutional: Negative for  fever. Cardiovascular: Negative for chest pain. Respiratory: Negative for shortness of breath. Gastrointestinal: Negative for abdominal pain, vomiting and diarrhea. Genitourinary: Negative for dysuria. Neurological: Significant headache.  Denies focal weakness or numbness. All other ROS negative  ____________________________________________   PHYSICAL EXAM:  VITAL SIGNS: ED Triage Vitals  Enc Vitals Group     BP 06/29/17 0123 129/89     Pulse Rate 06/29/17 0123 (!) 104     Resp 06/29/17 0123 20     Temp 06/29/17 0123 98.9 F (37.2 C)     Temp Source 06/29/17 0123 Oral      SpO2 06/29/17 0123 98 %     Weight 06/29/17 0122 250 lb (113.4 kg)     Height 06/29/17 0122 5\' 5"  (1.651 m)     Head Circumference --      Peak Flow --      Pain Score 06/29/17 0121 10     Pain Loc --      Pain Edu? --      Excl. in GC? --    Constitutional: Alert and oriented. Well appearing and in no distress. Eyes: Normal exam ENT   Head: Normocephalic and atraumatic.   Mouth/Throat: Mucous membranes are moist. Cardiovascular: Normal rate, regular rhythm. No murmur Respiratory: Normal respiratory effort without tachypnea nor retractions. Breath sounds are clear Gastrointestinal: Soft and nontender. No distention. Musculoskeletal: Nontender with normal range of motion in all extremities.  Neurologic:  Normal speech and language. No gross focal neurologic deficits  Skin:  Skin is warm, dry and intact.  Psychiatric: Mood and affect are normal.   ____________________________________________    EKG  EKG reviewed and interpreted by myself shows sinus tachycardia 107 bpm, narrow QRS, normal axis, normal intervals, nonspecific but no concerning ST changes.  ____________________________________________    RADIOLOGY  CT head normal.  ____________________________________________   INITIAL IMPRESSION / ASSESSMENT AND PLAN / ED COURSE  Pertinent labs & imaging results that were available during my care of the patient were reviewed by me and considered in my medical decision making (see chart for details).  Patient presents to the emergency department with a headache and an episode of unconsciousness.  Differential would include syncope, seizure, migraine, tension headache, less likely ICH.  Overall the patient appears very well, no distress with no concerning findings on physical examination.  Patient's labs show a slight leukocytosis of 12,000 which could go along with a seizure.  Labs are otherwise within normal limits including a normal urinalysis and negative urine  pregnancy test.  After discussion with the patient we have jointly decided to proceed with a CT scan of the head to rule out intracranial abnormality.  Given the patient's history I highly suspect a seizure tonight.  We will treat the patient's headache with Toradol, Reglan, Benadryl and IV fluids while awaiting CT imaging.  I discussed with the patient if the workup is normal she will likely be able to go home however she will need to follow-up with a neurologist and she is not to drive until cleared by neurology.  Patient is agreeable to this plan of care.  Patient's labs are largely within normal limits, CT scan of the head is normal.  Patient is feeling better after medications.  Patient's mother will come pick them up.  I again reiterated with the patient not to drive until cleared by neurology.  ____________________________________________   FINAL CLINICAL IMPRESSION(S) / ED DIAGNOSES  Headache Seizure    Minna AntisPaduchowski, Eddye Broxterman, MD 06/29/17 0502

## 2017-06-29 NOTE — ED Notes (Signed)

## 2017-06-29 NOTE — Discharge Instructions (Signed)
As we discussed please do not drive until cleared by neurology.  Please call the number provided to arrange the next available appointment with neurology.  Return to the emergency department for any further seizure-like activity, or any other symptom personally concerning to yourself.

## 2017-06-29 NOTE — ED Notes (Signed)
Pt reports having HA that began yest, denies taking OTC medication for pain. Pt denies hx of HA or migraines. Pt reports light sensitivity, denies floaters or change in vision. Pt also reports she was going to her car to pick up her significant other and reports "I got to the car around 11:58, looked at my phone then woke up on the ground at 12:20." Pt states she denies remembering event, pt denies pain or inj to self. Pt states she then drove to pick up her significant other. Pt denies N/V/D, pt reports "I''ve had HA before but it's never hurt this bad." Pt states "I may have passed out from the pain." EDP made aware.

## 2017-06-29 NOTE — ED Notes (Signed)
Patient transported to CT 

## 2017-06-29 NOTE — ED Triage Notes (Signed)
Pt with co headache today denies any hx of the same. Pt denies any recent illness states was walking when she had a syncopal episode. Denies any injury, denies any n.v.d.

## 2017-07-31 ENCOUNTER — Emergency Department
Admission: EM | Admit: 2017-07-31 | Discharge: 2017-07-31 | Disposition: A | Discharge status: Home / Self Care | Payer: Self-pay | Attending: Emergency Medicine | Admitting: Emergency Medicine

## 2017-07-31 ENCOUNTER — Other Ambulatory Visit: Payer: Self-pay

## 2017-07-31 DIAGNOSIS — R569 Unspecified convulsions: Secondary | ICD-10-CM | POA: Insufficient documentation

## 2017-07-31 DIAGNOSIS — F17210 Nicotine dependence, cigarettes, uncomplicated: Secondary | ICD-10-CM | POA: Insufficient documentation

## 2017-07-31 DIAGNOSIS — I10 Essential (primary) hypertension: Secondary | ICD-10-CM | POA: Insufficient documentation

## 2017-07-31 DIAGNOSIS — Z79899 Other long term (current) drug therapy: Secondary | ICD-10-CM | POA: Insufficient documentation

## 2017-07-31 LAB — COMPREHENSIVE METABOLIC PANEL
ALBUMIN: 3.7 g/dL (ref 3.5–5.0)
ALT: 39 U/L (ref 14–54)
ANION GAP: 8 (ref 5–15)
AST: 32 U/L (ref 15–41)
Alkaline Phosphatase: 92 U/L (ref 38–126)
BILIRUBIN TOTAL: 0.5 mg/dL (ref 0.3–1.2)
BUN: 10 mg/dL (ref 6–20)
CALCIUM: 9.2 mg/dL (ref 8.9–10.3)
CO2: 24 mmol/L (ref 22–32)
Chloride: 105 mmol/L (ref 101–111)
Creatinine, Ser: 0.52 mg/dL (ref 0.44–1.00)
GFR calc Af Amer: >60 mL/min (ref >60)
GFR calc non Af Amer: >60 mL/min (ref >60)
GLUCOSE: 115 mg/dL — AB (ref 65–99)
Potassium: 3.3 mmol/L — ABNORMAL LOW (ref 3.5–5.1)
Sodium: 137 mmol/L (ref 135–145)
TOTAL PROTEIN: 7.4 g/dL (ref 6.5–8.1)

## 2017-07-31 LAB — CBC WITH DIFFERENTIAL/PLATELET
Basophils Absolute: 0.1 10*3/uL (ref 0–0.1)
Basophils Relative: 1 %
Eosinophils Absolute: 0.2 10*3/uL (ref 0–0.7)
Eosinophils Relative: 2 %
HEMATOCRIT: 35.7 % (ref 35.0–47.0)
HEMOGLOBIN: 11.8 g/dL — AB (ref 12.0–16.0)
LYMPHS ABS: 2.7 10*3/uL (ref 1.0–3.6)
LYMPHS PCT: 25 %
MCH: 26.4 pg (ref 26.0–34.0)
MCHC: 33 g/dL (ref 32.0–36.0)
MCV: 80.1 fL (ref 80.0–100.0)
MONOS PCT: 8 %
Monocytes Absolute: 0.9 10*3/uL (ref 0.2–0.9)
NEUTROS ABS: 6.9 10*3/uL — AB (ref 1.4–6.5)
NEUTROS PCT: 64 %
Platelets: 341 10*3/uL (ref 150–440)
RBC: 4.46 MIL/uL (ref 3.80–5.20)
RDW: 14.3 % (ref 11.5–14.5)
WBC: 10.8 10*3/uL (ref 3.6–11.0)

## 2017-07-31 LAB — URINALYSIS, COMPLETE (UACMP) WITH MICROSCOPIC
Bilirubin Urine: NEGATIVE
GLUCOSE, UA: NEGATIVE mg/dL
Hgb urine dipstick: NEGATIVE
KETONES UR: NEGATIVE mg/dL
Nitrite: NEGATIVE
PROTEIN: NEGATIVE mg/dL
Specific Gravity, Urine: 1.014 (ref 1.005–1.030)
pH: 7 (ref 5.0–8.0)

## 2017-07-31 LAB — HCG, QUANTITATIVE, PREGNANCY

## 2017-07-31 MED ORDER — SODIUM CHLORIDE 0.9 % IV SOLN
1000.0000 mg | Freq: Once | INTRAVENOUS | Status: AC
  Administered 2017-07-31: 1000 mg via INTRAVENOUS
  Filled 2017-07-31: qty 10

## 2017-07-31 MED ORDER — LEVETIRACETAM 500 MG PO TABS: 500.0000 mg | ORAL_TABLET | Freq: Two times a day (BID) | ORAL | 0 refills | Status: DC

## 2017-07-31 NOTE — ED Notes (Signed)
Pt up to toilet with no difficulty 

## 2017-07-31 NOTE — ED Notes (Addendum)
Family at bedside, husband states pt didn't hit her head and that he was able to break her fall.

## 2017-07-31 NOTE — ED Triage Notes (Signed)
Pt comes via [facility] with c/o seizure. EMS states per husband pt got up to go to bathroom and began shaking. Pt has had 2 seizures in past but not on any medications. Pt is A&OX4. EMS gave 2mg  versed. VS- BP 110/52, HR-110, 96% room air, BS-134. Pt appears in NAD, respirations even and unlabored at this time.

## 2017-07-31 NOTE — ED Provider Notes (Signed)
[facility] Emergency Department Provider Note  ____________________________________________   First MD Initiated Contact with Patient 07/31/17 [phone]     (approximate)  I have reviewed the triage vital signs and the nursing notes.   HISTORY  Chief Complaint Seizures   HPI Paulino Rilylizabeth O Stricker is a 21 y.o. female who comes the emergency department by EMS after having a generalized tonic-clonic seizure.  When EMS arrived the patient was postictal.  She had a normal blood sugar in route along with normal vital signs.  According to the patient she had seizures when she was a child although has not seen a neurologist nor taking any antiepileptics roughly 10 years.  She has been having recent marital stress and had an argument with the seizure today.  No fevers or chills.  No chest pain or shortness of breath.  Her symptoms began suddenly and went away on their own.  Past Medical History:  Diagnosis Date  . Anxiety   . Depression   . Hypertension   . Seizures (HCC)    Febrile seizures as a child and undiagnosed seizure on 01/2015    There are no active problems to display for this patient.   Past Surgical History:  Procedure Laterality Date  . CHOLECYSTECTOMY N/A 10/23/2016   Procedure: LAPAROSCOPIC CHOLECYSTECTOMY WITH INTRAOPERATIVE CHOLANGIOGRAM;  Surgeon: Nadeen LandauJarvis Wilton Smith, MD;  Location: [facility] ORS;  Service: General;  Laterality: N/A;  . MOUTH BIOPSY     benign  . MOUTH SURGERY    . WISDOM TOOTH EXTRACTION      Prior to Admission medications   Medication Sig Start Date End Date Taking? Authorizing Provider  calcium carbonate (TUMS - DOSED IN MG ELEMENTAL CALCIUM) 500 MG chewable tablet Chew 1 tablet by mouth daily as needed for indigestion or heartburn.    [provider]  escitalopram (LEXAPRO) 20 MG tablet Take 20 mg by mouth daily.    [provider]  HYDROcodone-acetaminophen (NORCO/VICODIN) 5-325 MG tablet Take 1-2 tablets by  mouth every 6 (six) hours as needed for severe pain. 02/18/17   Lutricia Feiloemer, William P, PA-C  ibuprofen (ADVIL,MOTRIN) 200 MG tablet Take 800 mg by mouth every 6 (six) hours as needed for mild pain.    [provider]  levETIRAcetam (KEPPRA) 500 MG tablet Take 1 tablet (500 mg total) by mouth 2 (two) times daily. 07/31/17 08/30/17  Merrily Brittleifenbark, Jaslynne Dahan, MD  lisinopril (PRINIVIL,ZESTRIL) 5 MG tablet Take 5 mg by mouth daily.    [provider]  ondansetron (ZOFRAN ODT) 8 MG disintegrating tablet Take 1 tablet (8 mg total) by mouth 2 (two) times daily. 01/15/17   Lutricia Feiloemer, William P, PA-C  venlafaxine XR (EFFEXOR-XR) 150 MG 24 hr capsule Take 150 mg by mouth daily with breakfast.    [provider]    Allergies Bee venom  No family history on file.  Social History Social History   Tobacco Use  . Smoking status: Light Tobacco Smoker    Packs/day: 0.25    Types: Cigarettes  . Smokeless tobacco: Never Used  Substance Use Topics  . Alcohol use: No  . Drug use: No    Review of Systems Constitutional: No fever/chills Eyes: No visual changes. ENT: No sore throat. Cardiovascular: Denies chest pain. Respiratory: Denies shortness of breath. Gastrointestinal: No abdominal pain.  No nausea, no vomiting.  No diarrhea.  No constipation. Genitourinary: Negative for dysuria. Musculoskeletal: Negative for back pain. Skin: Negative for rash. Neurological: Positive for seizure   ____________________________________________  PHYSICAL EXAM:  VITAL SIGNS: ED Triage Vitals  Enc Vitals Group     BP      Pulse      Resp      Temp      Temp src      SpO2      Weight      Height      Head Circumference      Peak Flow      Pain Score      Pain Loc      Pain Edu?      Excl. in GC?     Constitutional: Alert and oriented x4 pleasant cooperative speaks in full clear sentences no diaphoresis Eyes: PERRL EOMI. Head: Atraumatic. Nose: No congestion/rhinnorhea. Mouth/Throat:  No trismus no bites to her tongue Neck: No stridor.   Cardiovascular: Normal rate, regular rhythm. Grossly normal heart sounds.  Good peripheral circulation. Respiratory: Normal respiratory effort.  No retractions. Lungs CTAB and moving good air Gastrointestinal: Obese soft nontender Musculoskeletal: No lower extremity edema   Neurologic:  Normal speech and language. No gross focal neurologic deficits are appreciated. Skin:  Skin is warm, dry and intact. No rash noted. Psychiatric: Mood and affect are normal. Speech and behavior are normal.    ____________________________________________   DIFFERENTIAL includes but not limited to  Seizure, urinary tract infection, upper respiratory tract infection, metabolic derangement ____________________________________________   LABS (all labs ordered are listed, but only abnormal results are displayed)  Labs Reviewed  COMPREHENSIVE METABOLIC PANEL - Abnormal; Notable for the following components:      Result Value   Potassium 3.3 (*)    Glucose, Bld 115 (*)    All other components within normal limits  CBC WITH DIFFERENTIAL/PLATELET - Abnormal; Notable for the following components:   Hemoglobin 11.8 (*)    Neutro Abs 6.9 (*)    All other components within normal limits  URINALYSIS, COMPLETE (UACMP) WITH MICROSCOPIC - Abnormal; Notable for the following components:   Color, Urine YELLOW (*)    APPearance HAZY (*)    Leukocytes, UA TRACE (*)    Bacteria, UA RARE (*)    Squamous Epithelial / LPF 0-5 (*)    All other components within normal limits  HCG, QUANTITATIVE, PREGNANCY    Lab work reviewed by me shows that the patient is not pregnant with no significant metabolic abnormalities __________________________________________  EKG  ED ECG REPORT I, Merrily BrittleNeil Therisa Mennella, the attending physician, personally viewed and interpreted this ECG.  Date: 07/30/2017 EKG Time:  Rate: 107 Rhythm: Sinus tachycardia QRS Axis: normal Intervals:  normal ST/T Wave abnormalities: normal Narrative Interpretation: no evidence of acute ischemia  ____________________________________________  RADIOLOGY   ____________________________________________   PROCEDURES  Procedure(s) performed: no  Procedures  Critical Care performed: no  Observation: no ____________________________________________   INITIAL IMPRESSION / ASSESSMENT AND PLAN / ED COURSE  Pertinent labs & imaging results that were available during my care of the patient were reviewed by me and considered in my medical decision making (see chart for details).  Patient arrives slightly postictal although with quickly resolving symptoms.  EKG with no concerning signs for ectopy.  Her husband is at bedside and is able to give a clear history of generalized tonic-clonic seizure.  I had a lengthy discussion with the patient and her family at bedside regarding the importance of reinitiation of antiepileptics as well as to reestablish care with neurology.  No acute infectious etiology noted.  Given 1 g of Keppra now and  I will give her 1 month supply.  She is discharged home in improved condition with strict return precautions.  Patient and her family verbalized understanding and agreement the plan.      ____________________________________________   FINAL CLINICAL IMPRESSION(S) / ED DIAGNOSES  Final diagnoses:  Seizure (HCC)      NEW MEDICATIONS STARTED DURING THIS VISIT:  This SmartLink is deprecated. Use AVSMEDLIST instead to display the medication list for a patient.   Note:  This document was prepared using Dragon voice recognition software and may include unintentional dictation errors.     Merrily Brittle, MD 07/31/17 2252

## 2017-07-31 NOTE — Discharge Instructions (Addendum)
Please begin taking your seizure medicine twice a day until you are cleared by a neurologist.  Please make an appointment with either Dr. Malvin JohnsPotter or Dr. Sherryll BurgerShah within the next 2 weeks for reevaluation.  Return to the emergency department sooner for any concerns.  DO NOT DRIVE UNTIL YOU HAVE BEEN CLEARED BY A NEUROLOGIST  It was a pleasure to take care of you today, and thank you for coming to our emergency department.  If you have any questions or concerns before leaving please ask the nurse to grab me and I'm more than happy to go through your aftercare instructions again.  If you were prescribed any opioid pain medication today such as Norco, Vicodin, Percocet, morphine, hydrocodone, or oxycodone please make sure you do not drive when you are taking this medication as it can alter your ability to drive safely.  If you have any concerns once you are home that you are not improving or are in fact getting worse before you can make it to your follow-up appointment, please do not hesitate to call 911 and come back for further evaluation.  Merrily BrittleNeil Cina Klumpp, MD  Results for orders placed or performed during the hospital encounter of 07/31/17  Comprehensive metabolic panel  Result Value Ref Range   Sodium 137 135 - 145 mmol/L   Potassium 3.3 (L) 3.5 - 5.1 mmol/L   Chloride 105 101 - 111 mmol/L   CO2 24 22 - 32 mmol/L   Glucose, Bld 115 (H) 65 - 99 mg/dL   BUN 10 6 - 20 mg/dL   Creatinine, Ser 1.610.52 0.44 - 1.00 mg/dL   Calcium 9.2 8.9 - 09.610.3 mg/dL   Total Protein 7.4 6.5 - 8.1 g/dL   Albumin 3.7 3.5 - 5.0 g/dL   AST 32 15 - 41 U/L   ALT 39 14 - 54 U/L   Alkaline Phosphatase 92 38 - 126 U/L   Total Bilirubin 0.5 0.3 - 1.2 mg/dL   GFR calc non Af Amer >60 >60 mL/min   GFR calc Af Amer >60 >60 mL/min   Anion gap 8 5 - 15  CBC with Differential  Result Value Ref Range   WBC 10.8 3.6 - 11.0 K/uL   RBC 4.46 3.80 - 5.20 MIL/uL   Hemoglobin 11.8 (L) 12.0 - 16.0 g/dL   HCT 04.535.7 40.935.0 - 81.147.0 %   MCV  80.1 80.0 - 100.0 fL   MCH 26.4 26.0 - 34.0 pg   MCHC 33.0 32.0 - 36.0 g/dL   RDW 91.414.3 78.211.5 - 95.614.5 %   Platelets 341 150 - 440 K/uL   Neutrophils Relative % 64 %   Neutro Abs 6.9 (H) 1.4 - 6.5 K/uL   Lymphocytes Relative 25 %   Lymphs Abs 2.7 1.0 - 3.6 K/uL   Monocytes Relative 8 %   Monocytes Absolute 0.9 0.2 - 0.9 K/uL   Eosinophils Relative 2 %   Eosinophils Absolute 0.2 0 - 0.7 K/uL   Basophils Relative 1 %   Basophils Absolute 0.1 0 - 0.1 K/uL  hCG, quantitative, pregnancy  Result Value Ref Range   hCG, Beta Chain, Quant, S <1 <5 mIU/mL

## 2017-08-21 ENCOUNTER — Other Ambulatory Visit: Payer: Self-pay

## 2017-08-21 ENCOUNTER — Encounter: Payer: Self-pay | Admitting: Emergency Medicine

## 2017-08-21 ENCOUNTER — Emergency Department
Admission: EM | Admit: 2017-08-21 | Discharge: 2017-08-21 | Disposition: A | Discharge status: Home / Self Care | Payer: Self-pay | Attending: Emergency Medicine | Admitting: Emergency Medicine

## 2017-08-21 DIAGNOSIS — Z79899 Other long term (current) drug therapy: Secondary | ICD-10-CM | POA: Insufficient documentation

## 2017-08-21 DIAGNOSIS — F17210 Nicotine dependence, cigarettes, uncomplicated: Secondary | ICD-10-CM | POA: Insufficient documentation

## 2017-08-21 DIAGNOSIS — I10 Essential (primary) hypertension: Secondary | ICD-10-CM | POA: Insufficient documentation

## 2017-08-21 DIAGNOSIS — H7292 Unspecified perforation of tympanic membrane, left ear: Secondary | ICD-10-CM

## 2017-08-21 MED ORDER — POLYMYXIN B-TRIMETHOPRIM 10000-0.1 UNIT/ML-% OP SOLN: 1.0000 [drp] | OPHTHALMIC | 0 refills | Status: AC

## 2017-08-21 NOTE — ED Provider Notes (Signed)
[facility] Emergency Department Provider Note  ____________________________________________  Time seen: Approximately 11:12 PM  I have reviewed the triage vital signs and the nursing notes.   HISTORY  Chief Complaint Otalgia    HPI Lori Henry is a 22 y.o. female presents to the emergency department with left otalgia after patient was cleaning her ear and accidentally bumped her left shoulder against her Q-tip.  Patient immediately noticed blood.  Patient currently rates her left ear pain at 7 out of 10 in intensity.  No alleviating measures have been attempted.   Past Medical History:  Diagnosis Date  . Anxiety   . Depression   . Hypertension   . Seizures (HCC)    Febrile seizures as a child and undiagnosed seizure on 01/2015    There are no active problems to display for this patient.   Past Surgical History:  Procedure Laterality Date  . CHOLECYSTECTOMY N/A 10/23/2016   Procedure: LAPAROSCOPIC CHOLECYSTECTOMY WITH INTRAOPERATIVE CHOLANGIOGRAM;  Surgeon: Nadeen Landau, MD;  Location: [facility] ORS;  Service: General;  Laterality: N/A;  . MOUTH BIOPSY     benign  . MOUTH SURGERY    . WISDOM TOOTH EXTRACTION      Prior to Admission medications   Medication Sig Start Date End Date Taking? Authorizing Provider  calcium carbonate (TUMS - DOSED IN MG ELEMENTAL CALCIUM) 500 MG chewable tablet Chew 1 tablet by mouth daily as needed for indigestion or heartburn.    [provider]  escitalopram (LEXAPRO) 20 MG tablet Take 20 mg by mouth daily.    [provider]  HYDROcodone-acetaminophen (NORCO/VICODIN) 5-325 MG tablet Take 1-2 tablets by mouth every 6 (six) hours as needed for severe pain. 02/18/17   Lutricia Feil, PA-C  ibuprofen (ADVIL,MOTRIN) 200 MG tablet Take 800 mg by mouth every 6 (six) hours as needed for mild pain.    [provider]  levETIRAcetam (KEPPRA) 500 MG tablet Take 1 tablet (500 mg total) by  mouth 2 (two) times daily. 07/31/17 08/30/17  Merrily Brittle, MD  lisinopril (PRINIVIL,ZESTRIL) 5 MG tablet Take 5 mg by mouth daily.    [provider]  ondansetron (ZOFRAN ODT) 8 MG disintegrating tablet Take 1 tablet (8 mg total) by mouth 2 (two) times daily. 01/15/17   Lutricia Feil, PA-C  trimethoprim-polymyxin b (POLYTRIM) ophthalmic solution Place 1 drop into the left ear every 4 (four) hours for 7 days. 08/21/17 08/28/17  Orvil Feil, PA-C  venlafaxine XR (EFFEXOR-XR) 150 MG 24 hr capsule Take 150 mg by mouth daily with breakfast.    [provider]    Allergies Bee venom  No family history on file.  Social History Social History   Tobacco Use  . Smoking status: Light Tobacco Smoker    Packs/day: 0.25    Types: Cigarettes  . Smokeless tobacco: Never Used  Substance Use Topics  . Alcohol use: No  . Drug use: No     Review of Systems  Constitutional: No fever/chills Eyes: No visual changes. No discharge ENT: Patient has left otalgia.  Cardiovascular: no chest pain. Respiratory: no cough. No SOB. Musculoskeletal: Negative for musculoskeletal pain. Skin: Negative for rash, abrasions, lacerations, ecchymosis. Neurological: Negative for headaches, focal weakness or numbness.   ____________________________________________   PHYSICAL EXAM:  VITAL SIGNS: ED Triage Vitals  Enc Vitals Group     BP 08/21/17 2222 (!) 150/98     Pulse Rate 08/21/17 2222 88     Resp 08/21/17 2222  19     Temp 08/21/17 2222 98.6 F (37 C)     Temp Source 08/21/17 2222 Oral     SpO2 08/21/17 2222 97 %     Weight 08/21/17 2220 240 lb (108.9 kg)     Height 08/21/17 2220 5\' 5"  (1.651 m)     Head Circumference --      Peak Flow --      Pain Score 08/21/17 2220 10     Pain Loc --      Pain Edu? --      Excl. in GC? --      Constitutional: Alert and oriented. Well appearing and in no acute distress. Eyes: Conjunctivae are normal. PERRL. EOMI. Head:  Atraumatic. ENT:      Ears: Patient has a perforated left tympanic membrane.  Patient's right tympanic membrane is pearly. Cardiovascular: Normal rate, regular rhythm. Normal S1 and S2.  Good peripheral circulation. Respiratory: Normal respiratory effort without tachypnea or retractions. Lungs CTAB. Good air entry to the bases with no decreased or absent breath sounds. Musculoskeletal: Full range of motion to all extremities. No gross deformities appreciated. Neurologic:  Normal speech and language. No gross focal neurologic deficits are appreciated.  Skin:  Skin is warm, dry and intact. No rash noted.  ____________________________________________   LABS (all labs ordered are listed, but only abnormal results are displayed)  Labs Reviewed - No data to display ____________________________________________  EKG   ____________________________________________  RADIOLOGY   No results found.  ____________________________________________    PROCEDURES  Procedure(s) performed:    Procedures    Medications - No data to display   ____________________________________________   INITIAL IMPRESSION / ASSESSMENT AND PLAN / ED COURSE  Pertinent labs & imaging results that were available during my care of the patient were reviewed by me and considered in my medical decision making (see chart for details).  Review of the [place] CSRS was performed in accordance of the [facility] prior to dispensing any controlled drugs.     Assessment and plan Left otalgia Patient presents to the emergency department with left ear pain.  On physical exam, patient's left tympanic membrane was perforated.  Original diagnosis included tympanic membrane perforation versus otitis media.  Patient was discharged with Polytrim and advised to follow-up with primary care as needed.  All patient questions were answered.  ____________________________________________  FINAL CLINICAL IMPRESSION(S) / ED  DIAGNOSES  Final diagnoses:  Perforation of left tympanic membrane      NEW MEDICATIONS STARTED DURING THIS VISIT:  ED Discharge Orders        Ordered    trimethoprim-polymyxin b (POLYTRIM) ophthalmic solution  Every 4 hours     08/21/17 2309          This chart was dictated using voice recognition software/Dragon. Despite best efforts to proofread, errors can occur which can change the meaning. Any change was purely unintentional.    Orvil FeilWoods, Valeria Krisko M, PA-C 08/21/17 2314    Sharyn CreamerQuale, Mark, MD 08/22/17 [id number]

## 2017-08-21 NOTE — ED Triage Notes (Addendum)
Pt says she was cleaning her ears and left a qtip in the left ear; pt says she hit something when she turned to the side and pushed the qtip in too far;

## 2018-04-05 ENCOUNTER — Encounter: Payer: Self-pay | Admitting: Emergency Medicine

## 2018-04-05 ENCOUNTER — Other Ambulatory Visit: Payer: Self-pay

## 2018-04-05 DIAGNOSIS — K2900 Acute gastritis without bleeding: Secondary | ICD-10-CM | POA: Insufficient documentation

## 2018-04-05 DIAGNOSIS — F17210 Nicotine dependence, cigarettes, uncomplicated: Secondary | ICD-10-CM | POA: Insufficient documentation

## 2018-04-05 DIAGNOSIS — K529 Noninfective gastroenteritis and colitis, unspecified: Secondary | ICD-10-CM | POA: Insufficient documentation

## 2018-04-05 DIAGNOSIS — I10 Essential (primary) hypertension: Secondary | ICD-10-CM | POA: Insufficient documentation

## 2018-04-05 DIAGNOSIS — Z79899 Other long term (current) drug therapy: Secondary | ICD-10-CM | POA: Insufficient documentation

## 2018-04-05 LAB — COMPREHENSIVE METABOLIC PANEL
ALK PHOS: 93 U/L (ref 38–126)
ALT: 39 U/L (ref 0–44)
AST: 25 U/L (ref 15–41)
Albumin: 4.1 g/dL (ref 3.5–5.0)
Anion gap: 5 (ref 5–15)
BUN: 10 mg/dL (ref 6–20)
CO2: 26 mmol/L (ref 22–32)
CREATININE: 0.75 mg/dL (ref 0.44–1.00)
Calcium: 9.3 mg/dL (ref 8.9–10.3)
Chloride: 108 mmol/L (ref 98–111)
Glucose, Bld: 126 mg/dL — ABNORMAL HIGH (ref 70–99)
Potassium: 3.2 mmol/L — ABNORMAL LOW (ref 3.5–5.1)
Sodium: 139 mmol/L (ref 135–145)
TOTAL PROTEIN: 8 g/dL (ref 6.5–8.1)
Total Bilirubin: 0.5 mg/dL (ref 0.3–1.2)

## 2018-04-05 LAB — CBC
HEMATOCRIT: 36.4 % (ref 35.0–47.0)
Hemoglobin: 12.2 g/dL (ref 12.0–16.0)
MCH: 26.1 pg (ref 26.0–34.0)
MCHC: 33.4 g/dL (ref 32.0–36.0)
MCV: 78.2 fL — ABNORMAL LOW (ref 80.0–100.0)
PLATELETS: 354 10*3/uL (ref 150–440)
RBC: 4.65 MIL/uL (ref 3.80–5.20)
RDW: 16 % — AB (ref 11.5–14.5)
WBC: 11 10*3/uL (ref 3.6–11.0)

## 2018-04-05 LAB — URINALYSIS, COMPLETE (UACMP) WITH MICROSCOPIC
BILIRUBIN URINE: NEGATIVE
GLUCOSE, UA: NEGATIVE mg/dL
HGB URINE DIPSTICK: NEGATIVE
Ketones, ur: NEGATIVE mg/dL
LEUKOCYTES UA: NEGATIVE
NITRITE: NEGATIVE
PH: 5 (ref 5.0–8.0)
Protein, ur: NEGATIVE mg/dL
SPECIFIC GRAVITY, URINE: 1.02 (ref 1.005–1.030)

## 2018-04-05 LAB — LIPASE, BLOOD: Lipase: 33 U/L (ref 11–51)

## 2018-04-05 NOTE — ED Notes (Signed)
Urine POC negative. 

## 2018-04-05 NOTE — ED Triage Notes (Signed)
Pt in via POV with complaints emesis x one day, states, "I cant keep anything down."  Pt denies any abdominal pain, denies diarrhea.  NAD noted at this time.

## 2018-04-06 ENCOUNTER — Encounter: Payer: Self-pay | Admitting: Emergency Medicine

## 2018-04-06 ENCOUNTER — Emergency Department
Admission: EM | Admit: 2018-04-06 | Discharge: 2018-04-06 | Disposition: A | Discharge status: Home / Self Care | Payer: Self-pay | Attending: Emergency Medicine | Admitting: Emergency Medicine

## 2018-04-06 DIAGNOSIS — R112 Nausea with vomiting, unspecified: Secondary | ICD-10-CM

## 2018-04-06 DIAGNOSIS — K2900 Acute gastritis without bleeding: Secondary | ICD-10-CM

## 2018-04-06 DIAGNOSIS — K529 Noninfective gastroenteritis and colitis, unspecified: Secondary | ICD-10-CM

## 2018-04-06 HISTORY — DX: Morbid (severe) obesity due to excess calories: E66.01

## 2018-04-06 LAB — POC URINE PREG, ED: PREG TEST UR: NEGATIVE

## 2018-04-06 MED ORDER — GI COCKTAIL ~~LOC~~
30.0000 mL | Freq: Once | ORAL | Status: AC
  Administered 2018-04-06: 30 mL via ORAL

## 2018-04-06 MED ORDER — OMEPRAZOLE MAGNESIUM 20 MG PO TBEC: 20.0000 mg | DELAYED_RELEASE_TABLET | Freq: Every day | ORAL | 1 refills | Status: DC

## 2018-04-06 MED ORDER — SUCRALFATE 1 G PO TABS: 1.0000 g | ORAL_TABLET | Freq: Four times a day (QID) | ORAL | 1 refills | Status: DC | PRN

## 2018-04-06 MED ORDER — ONDANSETRON HCL 4 MG PO TABS: ORAL_TABLET | ORAL | 0 refills | Status: DC

## 2018-04-06 MED ORDER — GI COCKTAIL ~~LOC~~
ORAL | Status: AC
  Filled 2018-04-06: qty 30

## 2018-04-06 MED ORDER — ONDANSETRON 4 MG PO TBDP
4.0000 mg | ORAL_TABLET | Freq: Once | ORAL | Status: AC
  Administered 2018-04-06: 4 mg via ORAL
  Filled 2018-04-06: qty 1

## 2018-04-06 NOTE — ED Provider Notes (Signed)
[facility] Emergency Department Provider Note  ____________________________________________   First MD Initiated Contact with Patient 04/06/18 [id number]     (approximate)  I have reviewed the triage vital signs and the nursing notes.   HISTORY  Chief Complaint Emesis    HPI Lori Henry is a 22 y.o. female who presents for evaluation of persistent nausea and vomiting.  She reports that it started during the night last night (almost 24 hours ago) at about 3:30 in the morning.  She had numerous episodes of vomiting, more than she can remember.  As result her whole body aches but particularly her throat which is sore.  States that she has not been able to keep anything down today.  She tried taking some Tums but they did not help.  She went ahead and went to work but felt bad throughout the time that she was there.  Nothing in particular makes her symptoms better or worse.  She describes it as severe.  She has no abdominal pain specifically but does have a burning sensation going up her throat.  She denies fever/chills, chest pain, shortness of breath, lower abdominal pain.  She has no dysuria.  She states that she has had diarrhea for months and that she had her gallbladder removed about a year ago.  Of note, she reports that she has not vomited for about 7 hours, but she is afraid to eat or drink anything.  Past Medical History:  Diagnosis Date  . Anxiety   . Depression   . Hypertension   . Morbid obesity (HCC)   . Seizures (HCC)    Febrile seizures as a child and undiagnosed seizure on 01/2015    There are no active problems to display for this patient.   Past Surgical History:  Procedure Laterality Date  . CHOLECYSTECTOMY N/A 10/23/2016   Procedure: LAPAROSCOPIC CHOLECYSTECTOMY WITH INTRAOPERATIVE CHOLANGIOGRAM;  Surgeon: Nadeen Landau, MD;  Location: [facility] ORS;  Service: General;  Laterality: N/A;  . MOUTH BIOPSY     benign  . MOUTH SURGERY      . WISDOM TOOTH EXTRACTION      Prior to Admission medications   Medication Sig Start Date End Date Taking? Authorizing Provider  calcium carbonate (TUMS - DOSED IN MG ELEMENTAL CALCIUM) 500 MG chewable tablet Chew 1 tablet by mouth daily as needed for indigestion or heartburn.    [provider]  escitalopram (LEXAPRO) 20 MG tablet Take 20 mg by mouth daily.    [provider]  HYDROcodone-acetaminophen (NORCO/VICODIN) 5-325 MG tablet Take 1-2 tablets by mouth every 6 (six) hours as needed for severe pain. 02/18/17   Lutricia Feil, PA-C  ibuprofen (ADVIL,MOTRIN) 200 MG tablet Take 800 mg by mouth every 6 (six) hours as needed for mild pain.    [provider]  levETIRAcetam (KEPPRA) 500 MG tablet Take 1 tablet (500 mg total) by mouth 2 (two) times daily. 07/31/17 08/30/17  Merrily Brittle, MD  lisinopril (PRINIVIL,ZESTRIL) 5 MG tablet Take 5 mg by mouth daily.    [provider]  omeprazole (PRILOSEC OTC) 20 MG tablet Take 1 tablet (20 mg total) by mouth daily. 04/06/18 04/06/19  Loleta Rose, MD  ondansetron (ZOFRAN ODT) 8 MG disintegrating tablet Take 1 tablet (8 mg total) by mouth 2 (two) times daily. 01/15/17   Lutricia Feil, PA-C  ondansetron (ZOFRAN) 4 MG tablet Take 1-2 tabs by mouth every 8 hours as needed for nausea/vomiting 04/06/18   York Cerise,  Kandee Keen, MD  sucralfate (CARAFATE) 1 g tablet Take 1 tablet (1 g total) by mouth 4 (four) times daily as needed (for abdominal discomfort, nausea, and/or vomiting). 04/06/18   Loleta Rose, MD  venlafaxine XR (EFFEXOR-XR) 150 MG 24 hr capsule Take 150 mg by mouth daily with breakfast.    [provider]    Allergies Bee venom  History reviewed. No pertinent family history.  Social History Social History   Tobacco Use  . Smoking status: Light Tobacco Smoker    Packs/day: 0.25    Types: Cigarettes  . Smokeless tobacco: Never Used  Substance Use Topics  . Alcohol use: No  . Drug use: No     Review of Systems Constitutional: No fever/chills Eyes: No visual changes. ENT: No sore throat. Cardiovascular: Denies chest pain. Respiratory: Denies shortness of breath. Gastrointestinal: Persistent vomiting with some burning abdominal pain for about 24 hours, chronic diarrhea, all as described above. Genitourinary: Negative for dysuria. Musculoskeletal: Negative for neck pain.  Negative for back pain. Integumentary: Negative for rash. Neurological: Negative for headaches, focal weakness or numbness.   ____________________________________________   PHYSICAL EXAM:  VITAL SIGNS: ED Triage Vitals  Enc Vitals Group     BP 04/05/18 2151 (!) 150/106     Pulse Rate 04/05/18 2151 94     Resp 04/05/18 2151 18     Temp 04/05/18 2151 98.6 F (37 C)     Temp Source 04/05/18 2151 Oral     SpO2 04/05/18 2151 99 %     Weight 04/05/18 2151 117.9 kg (260 lb)     Height 04/05/18 2151 1.651 m (5\' 5" )     Head Circumference --      Peak Flow --      Pain Score 04/05/18 2157 0     Pain Loc --      Pain Edu? --      Excl. in GC? --     Constitutional: Alert and oriented. Well appearing and in no acute distress. Eyes: Conjunctivae are normal.  Head: Atraumatic. Nose: No congestion/rhinnorhea. Mouth/Throat: Mucous membranes are moist. Neck: No stridor.  No meningeal signs.   Cardiovascular: Normal rate, regular rhythm. Good peripheral circulation. Grossly normal heart sounds. Respiratory: Normal respiratory effort.  No retractions. Lungs CTAB. Gastrointestinal: Morbid obesity.  Soft and nontender. No distention.  Musculoskeletal: No lower extremity tenderness nor edema. No gross deformities of extremities. Neurologic:  Normal speech and language. No gross focal neurologic deficits are appreciated.  Skin:  Skin is warm, dry and intact. No rash noted. Psychiatric: Mood and affect are normal. Speech and behavior are normal.  ____________________________________________   LABS (all  labs ordered are listed, but only abnormal results are displayed)  Labs Reviewed  COMPREHENSIVE METABOLIC PANEL - Abnormal; Notable for the following components:      Result Value   Potassium 3.2 (*)    Glucose, Bld 126 (*)    All other components within normal limits  CBC - Abnormal; Notable for the following components:   MCV 78.2 (*)    RDW 16.0 (*)    All other components within normal limits  URINALYSIS, COMPLETE (UACMP) WITH MICROSCOPIC - Abnormal; Notable for the following components:   Color, Urine YELLOW (*)    APPearance HAZY (*)    Bacteria, UA RARE (*)    All other components within normal limits  LIPASE, BLOOD  POC URINE PREG, ED   ____________________________________________  EKG  None - EKG not ordered by ED physician ____________________________________________  RADIOLOGY   ED MD interpretation: No indication for imaging  Official radiology report(s): No results found.  ____________________________________________   PROCEDURES  Critical Care performed: No   Procedure(s) performed:   Procedures   ____________________________________________   INITIAL IMPRESSION / ASSESSMENT AND PLAN / ED COURSE  As part of my medical decision making, I reviewed the following data within the electronic MEDICAL RECORD NUMBER Nursing notes reviewed and incorporated and Labs reviewed     Differential diagnosis includes, but is not limited to, nonspecific gastritis, viral gastritis, foodborne pathogen or preformed toxin, acid reflux.  The patient is well-appearing and in no acute distress.  She has unremarkable lab work except for slightly low potassium likely secondary to the vomiting.  No tenderness to palpation of the abdomen.  No evidence of acute infection, no leukocytosis, no ketones in her urine, normal lipase.  She has not vomited in at least 7 hours.  She was able to tolerate a GI cocktail, Zofran ODT 4 mg, and a p.o. challenge.  I had my usual customary  discussion with her, provided prescriptions as listed below, and encouraged outpatient follow-up.  She agrees with the plan.     ____________________________________________  FINAL CLINICAL IMPRESSION(S) / ED DIAGNOSES  Final diagnoses:  Non-intractable vomiting with nausea, unspecified vomiting type  Chronic diarrhea  Acute gastritis, presence of bleeding unspecified, unspecified gastritis type     MEDICATIONS GIVEN DURING THIS VISIT:  Medications  gi cocktail (Maalox,Lidocaine,Donnatal) (30 mLs Oral Given 04/06/18 0141)  ondansetron (ZOFRAN-ODT) disintegrating tablet 4 mg (4 mg Oral Given 04/06/18 0141)     ED Discharge Orders         Ordered    sucralfate (CARAFATE) 1 g tablet  4 times daily PRN     04/06/18 0127    omeprazole (PRILOSEC OTC) 20 MG tablet  Daily,   Status:  Discontinued     04/06/18 0127    ondansetron (ZOFRAN) 4 MG tablet     04/06/18 0127    omeprazole (PRILOSEC OTC) 20 MG tablet  Daily     04/06/18 0128           Note:  This document was prepared using Dragon voice recognition software and may include unintentional dictation errors.    Loleta Rose, MD 04/06/18 0430

## 2018-04-21 ENCOUNTER — Emergency Department (HOSPITAL_COMMUNITY): Payer: No Typology Code available for payment source

## 2018-04-21 ENCOUNTER — Other Ambulatory Visit: Payer: Self-pay

## 2018-04-21 ENCOUNTER — Emergency Department (HOSPITAL_COMMUNITY)
Admission: EM | Admit: 2018-04-21 | Discharge: 2018-04-21 | Disposition: A | Discharge status: Home / Self Care | Payer: No Typology Code available for payment source | Attending: Emergency Medicine | Admitting: Emergency Medicine

## 2018-04-21 ENCOUNTER — Encounter (HOSPITAL_COMMUNITY): Payer: Self-pay | Admitting: Emergency Medicine

## 2018-04-21 DIAGNOSIS — F17210 Nicotine dependence, cigarettes, uncomplicated: Secondary | ICD-10-CM | POA: Insufficient documentation

## 2018-04-21 DIAGNOSIS — S32501A Unspecified fracture of right pubis, initial encounter for closed fracture: Secondary | ICD-10-CM | POA: Insufficient documentation

## 2018-04-21 DIAGNOSIS — Y92410 Unspecified street and highway as the place of occurrence of the external cause: Secondary | ICD-10-CM | POA: Diagnosis not present

## 2018-04-21 DIAGNOSIS — Z23 Encounter for immunization: Secondary | ICD-10-CM | POA: Diagnosis not present

## 2018-04-21 DIAGNOSIS — R109 Unspecified abdominal pain: Secondary | ICD-10-CM | POA: Diagnosis not present

## 2018-04-21 DIAGNOSIS — R079 Chest pain, unspecified: Secondary | ICD-10-CM | POA: Diagnosis not present

## 2018-04-21 DIAGNOSIS — Y999 Unspecified external cause status: Secondary | ICD-10-CM | POA: Diagnosis not present

## 2018-04-21 DIAGNOSIS — Z79899 Other long term (current) drug therapy: Secondary | ICD-10-CM | POA: Insufficient documentation

## 2018-04-21 DIAGNOSIS — S0990XA Unspecified injury of head, initial encounter: Secondary | ICD-10-CM | POA: Diagnosis present

## 2018-04-21 DIAGNOSIS — I10 Essential (primary) hypertension: Secondary | ICD-10-CM | POA: Insufficient documentation

## 2018-04-21 DIAGNOSIS — Y9389 Activity, other specified: Secondary | ICD-10-CM | POA: Diagnosis not present

## 2018-04-21 DIAGNOSIS — S0101XA Laceration without foreign body of scalp, initial encounter: Secondary | ICD-10-CM | POA: Insufficient documentation

## 2018-04-21 LAB — COMPREHENSIVE METABOLIC PANEL
ALT: 40 U/L (ref 0–44)
ANION GAP: 7 (ref 5–15)
AST: 32 U/L (ref 15–41)
Albumin: 3.9 g/dL (ref 3.5–5.0)
Alkaline Phosphatase: 98 U/L (ref 38–126)
BILIRUBIN TOTAL: 0.6 mg/dL (ref 0.3–1.2)
BUN: 9 mg/dL (ref 6–20)
CO2: 25 mmol/L (ref 22–32)
Calcium: 9.4 mg/dL (ref 8.9–10.3)
Chloride: 108 mmol/L (ref 98–111)
Creatinine, Ser: 0.71 mg/dL (ref 0.44–1.00)
Glucose, Bld: 107 mg/dL — ABNORMAL HIGH (ref 70–99)
POTASSIUM: 3.4 mmol/L — AB (ref 3.5–5.1)
Sodium: 140 mmol/L (ref 135–145)
TOTAL PROTEIN: 7.5 g/dL (ref 6.5–8.1)

## 2018-04-21 LAB — I-STAT BETA HCG BLOOD, ED (MC, WL, AP ONLY)

## 2018-04-21 LAB — CBC
HEMATOCRIT: 39.2 % (ref 36.0–46.0)
Hemoglobin: 12.2 g/dL (ref 12.0–15.0)
MCH: 25.7 pg — ABNORMAL LOW (ref 26.0–34.0)
MCHC: 31.1 g/dL (ref 30.0–36.0)
MCV: 82.7 fL (ref 78.0–100.0)
Platelets: 306 10*3/uL (ref 150–400)
RBC: 4.74 MIL/uL (ref 3.87–5.11)
RDW: 14.5 % (ref 11.5–15.5)
WBC: 19 10*3/uL — AB (ref 4.0–10.5)

## 2018-04-21 LAB — I-STAT CHEM 8, ED
BUN: 9 mg/dL (ref 6–20)
CALCIUM ION: 1.09 mmol/L — AB (ref 1.15–1.40)
Chloride: 106 mmol/L (ref 98–111)
Creatinine, Ser: 0.6 mg/dL (ref 0.44–1.00)
Glucose, Bld: 108 mg/dL — ABNORMAL HIGH (ref 70–99)
HCT: 35 % — ABNORMAL LOW (ref 36.0–46.0)
HEMOGLOBIN: 11.9 g/dL — AB (ref 12.0–15.0)
Potassium: 3.3 mmol/L — ABNORMAL LOW (ref 3.5–5.1)
Sodium: 140 mmol/L (ref 135–145)
TCO2: 22 mmol/L (ref 22–32)

## 2018-04-21 LAB — ETHANOL: Alcohol, Ethyl (B): <10 mg/dL (ref <10)

## 2018-04-21 LAB — SAMPLE TO BLOOD BANK

## 2018-04-21 MED ORDER — ONDANSETRON HCL 4 MG/2ML IJ SOLN
4.0000 mg | Freq: Once | INTRAMUSCULAR | Status: AC
  Administered 2018-04-21: 4 mg via INTRAVENOUS
  Filled 2018-04-21: qty 2

## 2018-04-21 MED ORDER — TETANUS-DIPHTH-ACELL PERTUSSIS 5-2.5-18.5 LF-MCG/0.5 IM SUSP
0.5000 mL | Freq: Once | INTRAMUSCULAR | Status: AC
  Administered 2018-04-21: 0.5 mL via INTRAMUSCULAR
  Filled 2018-04-21: qty 0.5

## 2018-04-21 MED ORDER — IOHEXOL 300 MG/ML  SOLN
100.0000 mL | Freq: Once | INTRAMUSCULAR | Status: AC | PRN
  Administered 2018-04-21: 100 mL via INTRAVENOUS

## 2018-04-21 MED ORDER — ONDANSETRON 4 MG PO TBDP: 4.0000 mg | ORAL_TABLET | Freq: Four times a day (QID) | ORAL | 0 refills | Status: DC | PRN

## 2018-04-21 MED ORDER — FENTANYL CITRATE (PF) 100 MCG/2ML IJ SOLN
100.0000 ug | Freq: Once | INTRAMUSCULAR | Status: AC
  Administered 2018-04-21: 100 ug via INTRAVENOUS
  Filled 2018-04-21: qty 2

## 2018-04-21 MED ORDER — HYDROCODONE-ACETAMINOPHEN 5-325 MG PO TABS: 2.0000 | ORAL_TABLET | Freq: Four times a day (QID) | ORAL | 0 refills | Status: DC | PRN

## 2018-04-21 NOTE — ED Notes (Signed)
Patient verbalizes understanding of discharge instructions. Opportunity for questioning and answers were provided. Armband removed by staff, pt discharged from ED to home via POV  
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
## 2018-04-27 ENCOUNTER — Emergency Department: Payer: No Typology Code available for payment source

## 2018-04-27 ENCOUNTER — Encounter: Payer: Self-pay | Admitting: *Deleted

## 2018-04-27 ENCOUNTER — Other Ambulatory Visit: Payer: Self-pay

## 2018-04-27 ENCOUNTER — Emergency Department
Admission: EM | Admit: 2018-04-27 | Discharge: 2018-04-27 | Disposition: A | Discharge status: Home / Self Care | Payer: No Typology Code available for payment source | Attending: Emergency Medicine | Admitting: Emergency Medicine

## 2018-04-27 DIAGNOSIS — F0781 Postconcussional syndrome: Secondary | ICD-10-CM | POA: Diagnosis not present

## 2018-04-27 DIAGNOSIS — R51 Headache: Secondary | ICD-10-CM | POA: Diagnosis not present

## 2018-04-27 DIAGNOSIS — Z4802 Encounter for removal of sutures: Secondary | ICD-10-CM

## 2018-04-27 DIAGNOSIS — S0101XD Laceration without foreign body of scalp, subsequent encounter: Secondary | ICD-10-CM

## 2018-04-27 DIAGNOSIS — I10 Essential (primary) hypertension: Secondary | ICD-10-CM | POA: Insufficient documentation

## 2018-04-27 DIAGNOSIS — F17210 Nicotine dependence, cigarettes, uncomplicated: Secondary | ICD-10-CM | POA: Diagnosis not present

## 2018-04-27 DIAGNOSIS — Z79899 Other long term (current) drug therapy: Secondary | ICD-10-CM | POA: Insufficient documentation

## 2018-04-27 MED ORDER — ORPHENADRINE CITRATE 30 MG/ML IJ SOLN
60.0000 mg | INTRAMUSCULAR | Status: AC
  Administered 2018-04-27: 60 mg via INTRAMUSCULAR
  Filled 2018-04-27: qty 2

## 2018-04-27 MED ORDER — DIPHENHYDRAMINE HCL 25 MG PO CAPS
50.0000 mg | ORAL_CAPSULE | Freq: Once | ORAL | Status: AC
  Administered 2018-04-27: 50 mg via ORAL
  Filled 2018-04-27: qty 2

## 2018-04-27 MED ORDER — KETOROLAC TROMETHAMINE 10 MG PO TABS: 10.0000 mg | ORAL_TABLET | Freq: Three times a day (TID) | ORAL | 0 refills | Status: DC

## 2018-04-27 MED ORDER — METOCLOPRAMIDE HCL 10 MG PO TABS: 10.0000 mg | ORAL_TABLET | Freq: Three times a day (TID) | ORAL | 0 refills | Status: DC | PRN

## 2018-04-27 MED ORDER — METOCLOPRAMIDE HCL 10 MG PO TABS
10.0000 mg | ORAL_TABLET | Freq: Once | ORAL | Status: AC
  Administered 2018-04-27: 10 mg via ORAL
  Filled 2018-04-27: qty 1

## 2018-04-27 MED ORDER — KETOROLAC TROMETHAMINE 30 MG/ML IJ SOLN
60.0000 mg | Freq: Once | INTRAMUSCULAR | Status: AC
  Administered 2018-04-27: 60 mg via INTRAMUSCULAR
  Filled 2018-04-27: qty 2

## 2018-04-27 MED ORDER — CYCLOBENZAPRINE HCL 5 MG PO TABS: 5.0000 mg | ORAL_TABLET | Freq: Three times a day (TID) | ORAL | 0 refills | Status: AC | PRN
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
## 2018-04-27 NOTE — Discharge Instructions (Signed)
Your exam is essentially normal and your CT scan is again negative. You have symptoms consistent with a post-concussive headache syndrome. Take the prescription meds as directed. Avoid taking the muscle relaxant and Benadryl prior to or during work. Follow-up with your provider, a local community clinic, or [place] urgent care for ongoing symptoms.

## 2018-05-04 ENCOUNTER — Emergency Department
Admission: EM | Admit: 2018-05-04 | Discharge: 2018-05-04 | Disposition: A | Discharge status: Home / Self Care | Payer: No Typology Code available for payment source | Attending: Emergency Medicine | Admitting: Emergency Medicine

## 2018-05-04 ENCOUNTER — Encounter: Payer: Self-pay | Admitting: Emergency Medicine

## 2018-05-04 ENCOUNTER — Other Ambulatory Visit: Payer: Self-pay

## 2018-05-04 DIAGNOSIS — F17210 Nicotine dependence, cigarettes, uncomplicated: Secondary | ICD-10-CM | POA: Diagnosis not present

## 2018-05-04 DIAGNOSIS — S0101XD Laceration without foreign body of scalp, subsequent encounter: Secondary | ICD-10-CM | POA: Insufficient documentation

## 2018-05-04 DIAGNOSIS — Z79899 Other long term (current) drug therapy: Secondary | ICD-10-CM | POA: Diagnosis not present

## 2018-05-04 DIAGNOSIS — I10 Essential (primary) hypertension: Secondary | ICD-10-CM | POA: Insufficient documentation

## 2018-05-04 DIAGNOSIS — T8130XA Disruption of wound, unspecified, initial encounter: Secondary | ICD-10-CM

## 2018-05-04 DIAGNOSIS — Y69 Unspecified misadventure during surgical and medical care: Secondary | ICD-10-CM | POA: Insufficient documentation

## 2018-05-04 MED ORDER — LIDOCAINE-EPINEPHRINE-TETRACAINE (LET) SOLUTION
NASAL | Status: AC
  Filled 2018-05-04: qty 3
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
## 2018-06-09 ENCOUNTER — Encounter: Payer: Self-pay | Admitting: Emergency Medicine

## 2018-06-09 ENCOUNTER — Ambulatory Visit
Admission: EM | Admit: 2018-06-09 | Discharge: 2018-06-09 | Disposition: A | Discharge status: Home / Self Care | Payer: Medicaid Other | Attending: Family Medicine | Admitting: Family Medicine

## 2018-06-09 ENCOUNTER — Other Ambulatory Visit: Payer: Self-pay

## 2018-06-09 DIAGNOSIS — R42 Dizziness and giddiness: Secondary | ICD-10-CM

## 2018-06-09 DIAGNOSIS — I10 Essential (primary) hypertension: Secondary | ICD-10-CM

## 2018-06-09 MED ORDER — AMLODIPINE BESYLATE 5 MG PO TABS: 5.0000 mg | ORAL_TABLET | Freq: Every day | ORAL | 0 refills | Status: DC

## 2018-06-09 NOTE — ED Provider Notes (Signed)
[facility]    CSN: [id number] Arrival date & time: 06/09/18  1203  History   Chief Complaint Chief Complaint  Patient presents with  . Dizziness  . Headache   HPI  22 year old female presents with dizziness and headache.  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  She felt dizzy and had to stop.  She states that she feels lightheaded.  Associated blurry vision.  Pressure was taken at work and was 165/116.  She has untreated hypertension.  She currently does not have a primary care provider.  She states that she has insurance was only covers family-planning.  She does not have regular follow-up.  Additionally, patient has had some nasal congestion and sneezing as of this morning.  No fever.  No syncope.  No other associated symptoms.  No other complaints.  History reviewed and updated as below.  Past Medical History:  Diagnosis Date  . Anxiety   . Depression   . Hypertension   . Morbid obesity (HCC)   . Seizures (HCC)    Febrile seizures as a child and undiagnosed seizure on 01/2015   Past Surgical History:  Procedure Laterality Date  . CHOLECYSTECTOMY N/A 10/23/2016   Procedure: LAPAROSCOPIC CHOLECYSTECTOMY WITH INTRAOPERATIVE CHOLANGIOGRAM;  Surgeon: Nadeen Landau, MD;  Location: [facility] ORS;  Service: General;  Laterality: N/A;  . MOUTH BIOPSY     benign  . MOUTH SURGERY    . WISDOM TOOTH EXTRACTION      OB History   None    Home Medications    Prior to Admission medications   Medication Sig Start Date End Date Taking? Authorizing Provider  amLODipine (NORVASC) 5 MG tablet Take 1 tablet (5 mg total) by mouth daily. 06/09/18   Tommie Sams, DO   Social History Social History   Tobacco Use  . Smoking status: Light Tobacco Smoker    Packs/day: 0.25    Types: Cigarettes  . Smokeless tobacco: Current User    Types: Chew  Substance Use Topics  . Alcohol use: No  . Drug use: No     Allergies   Bee  venom   Review of Systems Review of Systems  Constitutional: Negative for fever.  HENT: Positive for congestion and sneezing.   Neurological: Positive for dizziness and light-headedness.   Physical Exam Triage Vital Signs ED Triage Vitals  Enc Vitals Group     BP 06/09/18 1232 (!) 142/111     Pulse Rate 06/09/18 1232 80     Resp 06/09/18 1232 18     Temp 06/09/18 1232 98.3 F (36.8 C)     Temp Source 06/09/18 1232 Oral     SpO2 06/09/18 1232 99 %     Weight 06/09/18 1230 260 lb (117.9 kg)     Height 06/09/18 1230 5\' 5"  (1.651 m)     Head Circumference --      Peak Flow --      Pain Score 06/09/18 1229 6     Pain Loc --      Pain Edu? --      Excl. in GC? --    Updated Vital Signs BP (!) 142/111 (BP Location: Left Arm)   Pulse 80   Temp 98.3 F (36.8 C) (Oral)   Resp 18   Ht 5\' 5"  (1.651 m)   Wt 117.9 kg   SpO2 99%   BMI 43.27 kg/m   Visual Acuity Right Eye Distance:  Left Eye Distance:   Bilateral Distance:    Right Eye Near:   Left Eye Near:    Bilateral Near:     Physical Exam  Constitutional: She is oriented to person, place, and time. She appears well-developed. No distress.  HENT:  Head: Normocephalic and atraumatic.  Cardiovascular: Normal rate and regular rhythm.  Pulmonary/Chest: Effort normal and breath sounds normal. She has no wheezes. She has no rales.  Neurological: She is alert and oriented to person, place, and time.  Psychiatric: She has a normal mood and affect. Her behavior is normal.  Nursing note and vitals reviewed.  UC Treatments / Results  Labs (all labs ordered are listed, but only abnormal results are displayed) Labs Reviewed - No data to display  EKG None  Radiology No results found.  Procedures Procedures (including critical care time)  Medications Ordered in UC Medications - No data to display  Initial Impression / Assessment and Plan / UC Course  I have reviewed the triage vital signs and the nursing  notes.  Pertinent labs & imaging results that were available during my care of the patient were reviewed by me and considered in my medical decision making (see chart for details).    22 year old female presents with dizziness.  This is likely secondary to beginnings of URI in addition to uncontrolled hypertension.  Placing on Norvasc.  Advised the patient that she needs close primary care follow-up.  She needs to establish with a primary.  Work note given.  Final Clinical Impressions(s) / UC Diagnoses   Final diagnoses:  Dizziness  Uncontrolled hypertension     Discharge Instructions     Rest.   Medication as prescribed.  Take care  Dr. Adriana Simas    ED Prescriptions    Medication Sig Dispense Auth. Provider   amLODipine (NORVASC) 5 MG tablet Take 1 tablet (5 mg total) by mouth daily. 90 tablet Tommie Sams, DO     Controlled Substance Prescriptions [place] Controlled Substance Registry consulted? Not Applicable   Tommie Sams, DO 06/09/18 1416

## 2018-06-09 NOTE — Discharge Instructions (Signed)
Rest  Medication as prescribed.  Take care  Dr. Huie Ghuman  

## 2018-06-09 NOTE — ED Triage Notes (Addendum)
Patient stated she was at work today when she looked up to retrieve something from a shelf and she became dizzy. Patient states he BP was checked at work and it was 165/116. Patient was advised to come to urgent care for evaluation. Patient also reports she woke up this morning with nasal congestion and sneezing.

## 2018-09-15 ENCOUNTER — Encounter: Payer: Self-pay | Admitting: Emergency Medicine

## 2018-09-15 ENCOUNTER — Emergency Department
Admission: EM | Admit: 2018-09-15 | Discharge: 2018-09-15 | Disposition: A | Discharge status: Home / Self Care | Payer: BLUE CROSS/BLUE SHIELD | Attending: Emergency Medicine | Admitting: Emergency Medicine

## 2018-09-15 ENCOUNTER — Other Ambulatory Visit: Payer: Self-pay

## 2018-09-15 DIAGNOSIS — F17210 Nicotine dependence, cigarettes, uncomplicated: Secondary | ICD-10-CM | POA: Insufficient documentation

## 2018-09-15 DIAGNOSIS — A084 Viral intestinal infection, unspecified: Secondary | ICD-10-CM | POA: Insufficient documentation

## 2018-09-15 DIAGNOSIS — Z79899 Other long term (current) drug therapy: Secondary | ICD-10-CM | POA: Insufficient documentation

## 2018-09-15 DIAGNOSIS — I10 Essential (primary) hypertension: Secondary | ICD-10-CM | POA: Diagnosis not present

## 2018-09-15 DIAGNOSIS — R112 Nausea with vomiting, unspecified: Secondary | ICD-10-CM | POA: Diagnosis present

## 2018-09-15 LAB — COMPREHENSIVE METABOLIC PANEL
ALK PHOS: 74 U/L (ref 38–126)
ALT: 41 U/L (ref 0–44)
ANION GAP: 7 (ref 5–15)
AST: 27 U/L (ref 15–41)
Albumin: 3.9 g/dL (ref 3.5–5.0)
BILIRUBIN TOTAL: 0.6 mg/dL (ref 0.3–1.2)
BUN: 10 mg/dL (ref 6–20)
CALCIUM: 8.8 mg/dL — AB (ref 8.9–10.3)
CO2: 25 mmol/L (ref 22–32)
Chloride: 105 mmol/L (ref 98–111)
Creatinine, Ser: 0.58 mg/dL (ref 0.44–1.00)
Glucose, Bld: 100 mg/dL — ABNORMAL HIGH (ref 70–99)
Potassium: 3.6 mmol/L (ref 3.5–5.1)
Sodium: 137 mmol/L (ref 135–145)
TOTAL PROTEIN: 7.4 g/dL (ref 6.5–8.1)

## 2018-09-15 LAB — CBC
HCT: 39.3 % (ref 36.0–46.0)
Hemoglobin: 12.2 g/dL (ref 12.0–15.0)
MCH: 25.3 pg — ABNORMAL LOW (ref 26.0–34.0)
MCHC: 31 g/dL (ref 30.0–36.0)
MCV: 81.4 fL (ref 80.0–100.0)
NRBC: 0 % (ref 0.0–0.2)
PLATELETS: 287 10*3/uL (ref 150–400)
RBC: 4.83 MIL/uL (ref 3.87–5.11)
RDW: 14.2 % (ref 11.5–15.5)
WBC: 9.9 10*3/uL (ref 4.0–10.5)

## 2018-09-15 LAB — URINALYSIS, COMPLETE (UACMP) WITH MICROSCOPIC
BILIRUBIN URINE: NEGATIVE
Glucose, UA: NEGATIVE mg/dL
Hgb urine dipstick: NEGATIVE
KETONES UR: NEGATIVE mg/dL
LEUKOCYTE UA: NEGATIVE
NITRITE: NEGATIVE
PH: 6 (ref 5.0–8.0)
Protein, ur: NEGATIVE mg/dL
SPECIFIC GRAVITY, URINE: 1.017 (ref 1.005–1.030)

## 2018-09-15 LAB — POCT PREGNANCY, URINE: Preg Test, Ur: NEGATIVE

## 2018-09-15 LAB — LIPASE, BLOOD: Lipase: 31 U/L (ref 11–51)

## 2018-09-15 MED ORDER — SODIUM CHLORIDE 0.9 % IV BOLUS
1000.0000 mL | Freq: Once | INTRAVENOUS | Status: AC
  Administered 2018-09-15: 1000 mL via INTRAVENOUS

## 2018-09-15 MED ORDER — SODIUM CHLORIDE 0.9% FLUSH
3.0000 mL | Freq: Once | INTRAVENOUS | Status: AC
  Administered 2018-09-15: 3 mL via INTRAVENOUS

## 2018-09-15 MED ORDER — ONDANSETRON 4 MG PO TBDP: 4.0000 mg | ORAL_TABLET | Freq: Three times a day (TID) | ORAL | 0 refills | Status: DC | PRN

## 2018-09-15 MED ORDER — ONDANSETRON HCL 4 MG/2ML IJ SOLN
4.0000 mg | Freq: Once | INTRAMUSCULAR | Status: AC
  Administered 2018-09-15: 4 mg via INTRAVENOUS
  Filled 2018-09-15: qty 2

## 2018-09-15 MED ORDER — KETOROLAC TROMETHAMINE 30 MG/ML IJ SOLN
15.0000 mg | Freq: Once | INTRAMUSCULAR | Status: AC
  Administered 2018-09-15: 15 mg via INTRAVENOUS
  Filled 2018-09-15: qty 1

## 2018-09-15 NOTE — ED Triage Notes (Signed)
Pt states N,V the past few days, and fever this am. Headaches intermittently, has been able to keep fluids down but can't eat.

## 2018-09-15 NOTE — ED Provider Notes (Signed)
[facility] Emergency Department Provider Note  ____________________________________________  Time seen: Approximately 10:23 AM  I have reviewed the triage vital signs and the nursing notes.   HISTORY  Chief Complaint Fever; Nausea; and Emesis   HPI Lori Henry is a 23 y.o. female with a history of hypertension, depression anxiety who presents for evaluation of vomiting and diarrhea.  Patient reports 10 days of several daily episodes of nonbloody nonbilious emesis and watery diarrhea.  No abdominal pain, no cough, congestion, chest pain, shortness of breath.  Patient reports that this morning she woke up soaked in sweat which made her concerned that she had a fever which prompted her visit to the emergency room.  No measured fevers at home.  No dysuria, hematuria.  Patient has been able to keep food and drinks down.  She is also complaining of diffuse body aches.  Past Medical History:  Diagnosis Date  . Anxiety   . Depression   . Hypertension   . Morbid obesity (HCC)   . Seizures (HCC)    Febrile seizures as a child and undiagnosed seizure on 01/2015    There are no active problems to display for this patient.   Past Surgical History:  Procedure Laterality Date  . CHOLECYSTECTOMY N/A 10/23/2016   Procedure: LAPAROSCOPIC CHOLECYSTECTOMY WITH INTRAOPERATIVE CHOLANGIOGRAM;  Surgeon: Lori Landau, MD;  Location: [facility] ORS;  Service: General;  Laterality: N/A;  . MOUTH BIOPSY     benign  . MOUTH SURGERY    . WISDOM TOOTH EXTRACTION      Prior to Admission medications   Medication Sig Start Date End Date Taking? Authorizing Provider  amLODipine (NORVASC) 5 MG tablet Take 1 tablet (5 mg total) by mouth daily. 06/09/18   Tommie Sams, DO  ondansetron (ZOFRAN ODT) 4 MG disintegrating tablet Take 1 tablet (4 mg total) by mouth every 8 (eight) hours as needed. 09/15/18   Lori Sickle, MD    Allergies Bee venom  No family history on  file.  Social History Social History   Tobacco Use  . Smoking status: Light Tobacco Smoker    Packs/day: 0.25    Types: Cigarettes  . Smokeless tobacco: Current User    Types: Chew  Substance Use Topics  . Alcohol use: No  . Drug use: No    Review of Systems  Constitutional: Negative for fever. + Body aches Eyes: Negative for visual changes. ENT: Negative for sore throat. Neck: No neck pain  Cardiovascular: Negative for chest pain. Respiratory: Negative for shortness of breath. Gastrointestinal: Negative for abdominal pain. + vomiting and diarrhea. Genitourinary: Negative for dysuria. Musculoskeletal: Negative for back pain. Skin: Negative for rash. Neurological: Negative for headaches, weakness or numbness. Psych: No SI or HI  ____________________________________________   PHYSICAL EXAM:  VITAL SIGNS: ED Triage Vitals  Enc Vitals Group     BP 09/15/18 0817 (!) 141/86     Pulse Rate 09/15/18 0817 77     Resp 09/15/18 0817 18     Temp 09/15/18 0817 98.2 F (36.8 C)     Temp Source 09/15/18 0817 Oral     SpO2 09/15/18 0817 98 %     Weight 09/15/18 0818 260 lb (117.9 kg)     Height 09/15/18 0818 5\' 5"  (1.651 m)     Head Circumference --      Peak Flow --      Pain Score 09/15/18 0818 0     Pain Loc --  Pain Edu? --      Excl. in GC? --     Constitutional: Alert and oriented. Well appearing and in no apparent distress. HEENT:      Head: Normocephalic and atraumatic.         Eyes: Conjunctivae are normal. Sclera is non-icteric.       Mouth/Throat: Mucous membranes are moist.       Neck: Supple with no signs of meningismus. Cardiovascular: Regular rate and rhythm. No murmurs, gallops, or rubs. 2+ symmetrical distal pulses are present in all extremities. No JVD. Respiratory: Normal respiratory effort. Lungs are clear to auscultation bilaterally. No wheezes, crackles, or rhonchi.  Gastrointestinal: Soft, non tender, and non distended with positive bowel  sounds. No rebound or guarding. Musculoskeletal: Nontender with normal range of motion in all extremities. No edema, cyanosis, or erythema of extremities. Neurologic: Normal speech and language. Face is symmetric. Moving all extremities. No gross focal neurologic deficits are appreciated. Skin: Skin is warm, dry and intact. No rash noted. Psychiatric: Mood and affect are normal. Speech and behavior are normal.  ____________________________________________   LABS (all labs ordered are listed, but only abnormal results are displayed)  Labs Reviewed  COMPREHENSIVE METABOLIC PANEL - Abnormal; Notable for the following components:      Result Value   Glucose, Bld 100 (*)    Calcium 8.8 (*)    All other components within normal limits  CBC - Abnormal; Notable for the following components:   MCH 25.3 (*)    All other components within normal limits  LIPASE, BLOOD  URINALYSIS, COMPLETE (UACMP) WITH MICROSCOPIC  POCT PREGNANCY, URINE   ____________________________________________  EKG  none  ____________________________________________  RADIOLOGY  none  ____________________________________________   PROCEDURES  Procedure(s) performed: None Procedures Critical Care performed:  None ____________________________________________   INITIAL IMPRESSION / ASSESSMENT AND PLAN / ED COURSE  23 y.o. female with a history of hypertension, depression anxiety who presents for evaluation of vomiting and diarrhea x 10 days and body aches.  Patient is extremely well-appearing and in no distress, has normal vital signs, abdomen is soft with no tenderness, lungs are clear to auscultation.  Patient looks extremely well hydrated on exam.  Labs including CBC, CMP, lipase, pregnancy test and urinalysis are all within normal limits.  Patient received Zofran for vomiting, Toradol for body aches and fluids.  Patient remains extremely well-appearing.  Will discharge home with Zofran, increase oral  hydration, and follow-up with primary care doctor.  Discussed and return precautions.      As part of my medical decision making, I reviewed the following data within the electronic MEDICAL RECORD NUMBER Nursing notes reviewed and incorporated, Labs reviewed , Old chart reviewed, Notes from prior ED visits and [place] Controlled Substance Database    Pertinent labs & imaging results that were available during my care of the patient were reviewed by me and considered in my medical decision making (see chart for details).    ____________________________________________   FINAL CLINICAL IMPRESSION(S) / ED DIAGNOSES  Final diagnoses:  Viral gastroenteritis      NEW MEDICATIONS STARTED DURING THIS VISIT:  ED Discharge Orders         Ordered    ondansetron (ZOFRAN ODT) 4 MG disintegrating tablet  Every 8 hours PRN     09/15/18 1023           Note:  This document was prepared using Dragon voice recognition software and may include unintentional dictation errors.    [place], [place],  MD 09/15/18 1026

## 2018-10-03 ENCOUNTER — Emergency Department
Admission: EM | Admit: 2018-10-03 | Discharge: 2018-10-03 | Disposition: A | Discharge status: Home / Self Care | Payer: BLUE CROSS/BLUE SHIELD | Attending: Emergency Medicine | Admitting: Emergency Medicine

## 2018-10-03 ENCOUNTER — Other Ambulatory Visit: Payer: Self-pay

## 2018-10-03 ENCOUNTER — Encounter: Payer: Self-pay | Admitting: Emergency Medicine

## 2018-10-03 DIAGNOSIS — F17210 Nicotine dependence, cigarettes, uncomplicated: Secondary | ICD-10-CM | POA: Insufficient documentation

## 2018-10-03 DIAGNOSIS — I10 Essential (primary) hypertension: Secondary | ICD-10-CM | POA: Diagnosis not present

## 2018-10-03 DIAGNOSIS — Z79899 Other long term (current) drug therapy: Secondary | ICD-10-CM | POA: Insufficient documentation

## 2018-10-03 DIAGNOSIS — G43901 Migraine, unspecified, not intractable, with status migrainosus: Secondary | ICD-10-CM | POA: Insufficient documentation

## 2018-10-03 DIAGNOSIS — R51 Headache: Secondary | ICD-10-CM | POA: Diagnosis present

## 2018-10-03 LAB — POCT PREGNANCY, URINE: Preg Test, Ur: NEGATIVE

## 2018-10-03 MED ORDER — BUTALBITAL-APAP-CAFFEINE 50-325-40 MG PO TABS: 1.0000 | ORAL_TABLET | Freq: Four times a day (QID) | ORAL | 0 refills | Status: DC | PRN

## 2018-10-03 MED ORDER — DIPHENHYDRAMINE HCL 50 MG/ML IJ SOLN
25.0000 mg | Freq: Once | INTRAMUSCULAR | Status: AC
  Administered 2018-10-03: 25 mg via INTRAVENOUS
  Filled 2018-10-03: qty 1

## 2018-10-03 MED ORDER — METOCLOPRAMIDE HCL 5 MG/ML IJ SOLN
10.0000 mg | Freq: Once | INTRAMUSCULAR | Status: AC
  Administered 2018-10-03: 10 mg via INTRAVENOUS
  Filled 2018-10-03: qty 2

## 2018-10-03 MED ORDER — SODIUM CHLORIDE 0.9 % IV BOLUS
1000.0000 mL | Freq: Once | INTRAVENOUS | Status: AC
  Administered 2018-10-03: 1000 mL via INTRAVENOUS

## 2018-10-03 MED ORDER — KETOROLAC TROMETHAMINE 30 MG/ML IJ SOLN
15.0000 mg | Freq: Once | INTRAMUSCULAR | Status: AC
  Administered 2018-10-03: 15 mg via INTRAVENOUS
  Filled 2018-10-03: qty 1
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
## 2018-10-03 NOTE — ED Triage Notes (Signed)
Pt arrives ambulatory and with steady gait to triage with headache since 0600 Sunday morning. Pt reports hx of migraines and is in NAD.

## 2018-10-04 ENCOUNTER — Encounter: Payer: Self-pay | Admitting: Emergency Medicine

## 2018-10-04 ENCOUNTER — Other Ambulatory Visit: Payer: Self-pay

## 2018-10-04 ENCOUNTER — Emergency Department
Admission: EM | Admit: 2018-10-04 | Discharge: 2018-10-04 | Disposition: A | Discharge status: Home / Self Care | Payer: BLUE CROSS/BLUE SHIELD | Attending: Emergency Medicine | Admitting: Emergency Medicine

## 2018-10-04 DIAGNOSIS — R51 Headache: Secondary | ICD-10-CM | POA: Insufficient documentation

## 2018-10-04 DIAGNOSIS — F17210 Nicotine dependence, cigarettes, uncomplicated: Secondary | ICD-10-CM | POA: Diagnosis not present

## 2018-10-04 DIAGNOSIS — R112 Nausea with vomiting, unspecified: Secondary | ICD-10-CM | POA: Diagnosis not present

## 2018-10-04 DIAGNOSIS — F329 Major depressive disorder, single episode, unspecified: Secondary | ICD-10-CM | POA: Insufficient documentation

## 2018-10-04 DIAGNOSIS — R519 Headache, unspecified: Secondary | ICD-10-CM

## 2018-10-04 DIAGNOSIS — I10 Essential (primary) hypertension: Secondary | ICD-10-CM | POA: Insufficient documentation

## 2018-10-04 DIAGNOSIS — F419 Anxiety disorder, unspecified: Secondary | ICD-10-CM | POA: Diagnosis not present

## 2018-10-04 DIAGNOSIS — Z9049 Acquired absence of other specified parts of digestive tract: Secondary | ICD-10-CM | POA: Diagnosis not present

## 2018-10-04 LAB — URINALYSIS, COMPLETE (UACMP) WITH MICROSCOPIC
Bacteria, UA: NONE SEEN
Bilirubin Urine: NEGATIVE
Glucose, UA: NEGATIVE mg/dL
Hgb urine dipstick: NEGATIVE
Ketones, ur: NEGATIVE mg/dL
Leukocytes,Ua: NEGATIVE
Nitrite: NEGATIVE
Protein, ur: 30 mg/dL — AB
Specific Gravity, Urine: 1.019 (ref 1.005–1.030)
pH: 5 (ref 5.0–8.0)

## 2018-10-04 LAB — CBC
HCT: 40.1 % (ref 36.0–46.0)
Hemoglobin: 12.7 g/dL (ref 12.0–15.0)
MCH: 25.5 pg — ABNORMAL LOW (ref 26.0–34.0)
MCHC: 31.7 g/dL (ref 30.0–36.0)
MCV: 80.4 fL (ref 80.0–100.0)
NRBC: 0 % (ref 0.0–0.2)
Platelets: 338 10*3/uL (ref 150–400)
RBC: 4.99 MIL/uL (ref 3.87–5.11)
RDW: 14.3 % (ref 11.5–15.5)
WBC: 16.3 10*3/uL — ABNORMAL HIGH (ref 4.0–10.5)

## 2018-10-04 LAB — COMPREHENSIVE METABOLIC PANEL
ALT: 38 U/L (ref 0–44)
AST: 26 U/L (ref 15–41)
Albumin: 3.9 g/dL (ref 3.5–5.0)
Alkaline Phosphatase: 80 U/L (ref 38–126)
Anion gap: 9 (ref 5–15)
BUN: 13 mg/dL (ref 6–20)
CO2: 22 mmol/L (ref 22–32)
Calcium: 8.9 mg/dL (ref 8.9–10.3)
Chloride: 106 mmol/L (ref 98–111)
Creatinine, Ser: 0.49 mg/dL (ref 0.44–1.00)
GFR calc Af Amer: >60 mL/min (ref >60)
GFR calc non Af Amer: >60 mL/min (ref >60)
Glucose, Bld: 110 mg/dL — ABNORMAL HIGH (ref 70–99)
POTASSIUM: 3.9 mmol/L (ref 3.5–5.1)
Sodium: 137 mmol/L (ref 135–145)
Total Bilirubin: 0.5 mg/dL (ref 0.3–1.2)
Total Protein: 8.2 g/dL — ABNORMAL HIGH (ref 6.5–8.1)

## 2018-10-04 LAB — LIPASE, BLOOD: Lipase: 31 U/L (ref 11–51)

## 2018-10-04 MED ORDER — KETOROLAC TROMETHAMINE 30 MG/ML IJ SOLN
30.0000 mg | Freq: Once | INTRAMUSCULAR | Status: DC
  Filled 2018-10-04: qty 1

## 2018-10-04 MED ORDER — AMLODIPINE BESYLATE 5 MG PO TABS
5.0000 mg | ORAL_TABLET | Freq: Once | ORAL | Status: AC
  Administered 2018-10-04: 5 mg via ORAL
  Filled 2018-10-04: qty 1

## 2018-10-04 MED ORDER — ONDANSETRON 4 MG PO TBDP: 4.0000 mg | ORAL_TABLET | Freq: Three times a day (TID) | ORAL | 0 refills | Status: DC | PRN

## 2018-10-04 MED ORDER — SODIUM CHLORIDE 0.9 % IV BOLUS
1000.0000 mL | Freq: Once | INTRAVENOUS | Status: AC
  Administered 2018-10-04: 1000 mL via INTRAVENOUS

## 2018-10-04 MED ORDER — ONDANSETRON HCL 4 MG/2ML IJ SOLN
4.0000 mg | Freq: Once | INTRAMUSCULAR | Status: AC
  Administered 2018-10-04: 4 mg via INTRAVENOUS
  Filled 2018-10-04: qty 2

## 2018-10-04 NOTE — Discharge Instructions (Signed)
Take a clear liquid diet for the next 24 hours, then advance to a bland diet as tolerated. You may take Zofran for nausea and vomiting.  If you begin to develop a headache that feels similar to your migraine, please take the Fioricet early on in the onset of symptoms.  You may also take Tylenol and Motrin.  Return to the emergency department for severe pain, lightheadedness or fainting, if you develop abdominal pain, fevers or chills, or any other symptoms concerning to you.

## 2018-10-04 NOTE — ED Notes (Signed)
Pt moved to room 15    Report given to [facility]

## 2018-10-04 NOTE — ED Notes (Signed)
Pt able to tolerate po challenge

## 2018-10-04 NOTE — ED Notes (Signed)
See triage note  States she was seen yesterday for migraine  States she had migraine cocktail and had relief  States she developed some vomiting last pm  Denies any fever or other sxs' and is afebrile on arrival

## 2018-10-04 NOTE — ED Notes (Signed)
poc negative

## 2018-10-04 NOTE — ED Triage Notes (Addendum)
Pt in via POV, reports being here yesterday for migraine, receiving "migraine cocktail."  Pt feels as if she may be having a reaction to medications because she is has had some vomiting since going home.  Ambulatory to triage, able to speak in clear, full sentences, NAD noted at this time.

## 2018-10-04 NOTE — ED Provider Notes (Addendum)
[facility] Emergency Department Provider Note  ____________________________________________  Time seen: Approximately 9:23 AM  I have reviewed the triage vital signs and the nursing notes.   HISTORY  Chief Complaint Allergic Reaction    HPI Lori Henry is a 23 y.o. female with a history of HTN, migraines, presenting for nausea and vomiting.  The patient was seen here yesterday for migraine, with reassuring vital signs, no focal neurologic deficit deficits, and complete resolution of her headache with symptomatic treatment. She was discharged with a prescription for Fioricet. When she arrived home, she began vomiting and has had 5 episodes of vomiting overnight.  She has not had any constipation or diarrhea; no abdominal pain.  No fevers or chills.  She has not tried anything for her symptoms.  At this time, she reports a mild pressure over her eyebrows which is "coming back," but not as bad as her prior migraine.  Past Medical History:  Diagnosis Date  . Anxiety   . Depression   . Hypertension   . Morbid obesity (HCC)   . Seizures (HCC)    Febrile seizures as a child and undiagnosed seizure on 01/2015    There are no active problems to display for this patient.   Past Surgical History:  Procedure Laterality Date  . CHOLECYSTECTOMY N/A 10/23/2016   Procedure: LAPAROSCOPIC CHOLECYSTECTOMY WITH INTRAOPERATIVE CHOLANGIOGRAM;  Surgeon: Nadeen LandauJarvis Wilton Smith, MD;  Location: [facility] ORS;  Service: General;  Laterality: N/A;  . MOUTH BIOPSY     benign  . MOUTH SURGERY    . WISDOM TOOTH EXTRACTION      Current Outpatient Rx  . Order #: [id number] Class: Normal  . Order #: [id number] Class: Normal  . Order #: [id number] Class: Print    Allergies Bee venom  No family history on file.  Social History Social History   Tobacco Use  . Smoking status: Light Tobacco Smoker    Packs/day: 0.25    Types: Cigarettes  . Smokeless tobacco: Current User     Types: Chew  Substance Use Topics  . Alcohol use: No  . Drug use: No    Review of Systems Constitutional: No fever/chills.  No lightheadedness or syncope. Eyes: No visual changes.  No blurred or double vision. ENT: No sore throat. No congestion or rhinorrhea. Cardiovascular: Denies chest pain. Denies palpitations. Respiratory: Denies shortness of breath.  No cough. Gastrointestinal: No abdominal pain.  Positive nausea and vomiting..  No diarrhea.  No constipation. Genitourinary: Negative for dysuria. Musculoskeletal: Negative for back pain. Skin: Negative for rash. Neurological: Positive for mild headache, improved compared to yesterday.. No focal numbness, tingling or weakness.  No changes in vision, speech or mental status.  No difficulty walking.    ____________________________________________   PHYSICAL EXAM:  VITAL SIGNS: ED Triage Vitals  Enc Vitals Group     BP 10/04/18 0844 125/63     Pulse Rate 10/04/18 0844 (!) 102     Resp --      Temp 10/04/18 0844 99 F (37.2 C)     Temp Source 10/04/18 0844 Oral     SpO2 10/04/18 0844 98 %     Weight 10/04/18 0841 260 lb (117.9 kg)     Height 10/04/18 0841 5\' 5"  (1.651 m)     Head Circumference --      Peak Flow --      Pain Score 10/04/18 0840 6     Pain Loc --      Pain Edu? --  Excl. in GC? --     Constitutional: Alert and oriented.  Answers questions appropriately. Eyes: Conjunctivae are normal.  EOMI. PERRLA.  No scleral icterus. Head: Atraumatic. Nose: No congestion/rhinnorhea. Mouth/Throat: Mucous membranes are moist.  Neck: No stridor.  Supple.  JVD.  No meningismus. Cardiovascular: Normal rate, regular rhythm. No murmurs, rubs or gallops.  Respiratory: Normal respiratory effort.  No accessory muscle use or retractions. Lungs CTAB.  No wheezes, rales or ronchi. Gastrointestinal: Obese.  Soft, nontender and nondistended.  No guarding or rebound.  No peritoneal signs. Musculoskeletal: No LE edema.   Neurologic:  A&Ox3.  Speech is clear.  Face and smile are symmetric.  EOMI.  Moves all extremities well. Skin:  Skin is warm, dry and intact. No rash noted. Psychiatric: Mood and affect are normal.   ____________________________________________   LABS (all labs ordered are listed, but only abnormal results are displayed)  Labs Reviewed  URINALYSIS, COMPLETE (UACMP) WITH MICROSCOPIC - Abnormal; Notable for the following components:      Result Value   Color, Urine YELLOW (*)    APPearance HAZY (*)    Protein, ur 30 (*)    All other components within normal limits  CBC - Abnormal; Notable for the following components:   WBC 16.3 (*)    MCH 25.5 (*)    All other components within normal limits  COMPREHENSIVE METABOLIC PANEL - Abnormal; Notable for the following components:   Glucose, Bld 110 (*)    Total Protein 8.2 (*)    All other components within normal limits  LIPASE, BLOOD  POC URINE PREG, ED   ____________________________________________  EKG  Not indicated ____________________________________________  RADIOLOGY  No results found.  ____________________________________________   PROCEDURES  Procedure(s) performed: None  Procedures  Critical Care performed: No ____________________________________________   INITIAL IMPRESSION / ASSESSMENT AND PLAN / ED COURSE  Pertinent labs & imaging results that were available during my care of the patient were reviewed by me and considered in my medical decision making (see chart for details).  23 y.o. female with a history of hypertension and migraines presenting for nausea and vomiting.  Overall, the patient has mild hypertension but is well-appearing.  She did not take her blood pressure medicine since she was vomiting and I will give her an antiemetic followed by her home dose of Norvasc.  The patient has no focal neurologic deficit on my exam.  Her headache is very mild at this time.  There is no history that would be  suggestive of an intracranial mass, intracranial bleed, and imaging is not indicated at this time since her headache has significantly improved.  The patient has had no cough or cold symptoms, and is not in the high risk population for influenza, so influenza testing is not indicated.  Will rule out pregnancy, UTI and check her electrolytes given her vomiting overnight.  She will receive symptomatic treatment and we will reevaluate her for final disposition.  ----------------------------------------- 10:36 AM on 10/04/2018 -----------------------------------------  The patient has been tolerating clear liquids without difficulty and her headache has completely resolved.  At this time, plan to discharge her home.  I have discussed symptomatic treatment with the patient.  She understands follow-up instructions as well as return precautions.  ____________________________________________  FINAL CLINICAL IMPRESSION(S) / ED DIAGNOSES  Final diagnoses:  Non-intractable vomiting with nausea, unspecified vomiting type  Nonintractable headache, unspecified chronicity pattern, unspecified headache type         NEW MEDICATIONS STARTED DURING THIS VISIT:  New Prescriptions   ONDANSETRON (ZOFRAN ODT) 4 MG DISINTEGRATING TABLET    Take 1 tablet (4 mg total) by mouth every 8 (eight) hours as needed.      Rockne Menghini, MD 10/04/18 4034    Rockne Menghini, MD 10/04/18 1037

## 2018-10-04 NOTE — ED Notes (Signed)
Pt given water for po challenge.

## 2018-10-11 ENCOUNTER — Emergency Department: Payer: BLUE CROSS/BLUE SHIELD

## 2018-10-11 ENCOUNTER — Emergency Department
Admission: EM | Admit: 2018-10-11 | Discharge: 2018-10-11 | Disposition: A | Discharge status: Home / Self Care | Payer: BLUE CROSS/BLUE SHIELD | Attending: Emergency Medicine | Admitting: Emergency Medicine

## 2018-10-11 ENCOUNTER — Other Ambulatory Visit: Payer: Self-pay

## 2018-10-11 ENCOUNTER — Encounter: Payer: Self-pay | Admitting: Emergency Medicine

## 2018-10-11 DIAGNOSIS — F17210 Nicotine dependence, cigarettes, uncomplicated: Secondary | ICD-10-CM | POA: Insufficient documentation

## 2018-10-11 DIAGNOSIS — I10 Essential (primary) hypertension: Secondary | ICD-10-CM | POA: Diagnosis not present

## 2018-10-11 DIAGNOSIS — G43909 Migraine, unspecified, not intractable, without status migrainosus: Secondary | ICD-10-CM | POA: Diagnosis not present

## 2018-10-11 DIAGNOSIS — F419 Anxiety disorder, unspecified: Secondary | ICD-10-CM | POA: Diagnosis not present

## 2018-10-11 DIAGNOSIS — R079 Chest pain, unspecified: Secondary | ICD-10-CM | POA: Diagnosis present

## 2018-10-11 DIAGNOSIS — F17220 Nicotine dependence, chewing tobacco, uncomplicated: Secondary | ICD-10-CM | POA: Diagnosis not present

## 2018-10-11 DIAGNOSIS — Z79899 Other long term (current) drug therapy: Secondary | ICD-10-CM | POA: Insufficient documentation

## 2018-10-11 LAB — COMPREHENSIVE METABOLIC PANEL
ALT: 71 U/L — ABNORMAL HIGH (ref 0–44)
AST: 28 U/L (ref 15–41)
Albumin: 4.2 g/dL (ref 3.5–5.0)
Alkaline Phosphatase: 83 U/L (ref 38–126)
Anion gap: 7 (ref 5–15)
BILIRUBIN TOTAL: 0.5 mg/dL (ref 0.3–1.2)
BUN: 10 mg/dL (ref 6–20)
CO2: 25 mmol/L (ref 22–32)
Calcium: 9.3 mg/dL (ref 8.9–10.3)
Chloride: 107 mmol/L (ref 98–111)
Creatinine, Ser: 0.51 mg/dL (ref 0.44–1.00)
GFR calc non Af Amer: >60 mL/min (ref >60)
Glucose, Bld: 107 mg/dL — ABNORMAL HIGH (ref 70–99)
Potassium: 3.4 mmol/L — ABNORMAL LOW (ref 3.5–5.1)
Sodium: 139 mmol/L (ref 135–145)
TOTAL PROTEIN: 8.6 g/dL — AB (ref 6.5–8.1)

## 2018-10-11 LAB — CBC WITH DIFFERENTIAL/PLATELET
Abs Immature Granulocytes: 0.06 10*3/uL (ref 0.00–0.07)
Basophils Absolute: 0.1 10*3/uL (ref 0.0–0.1)
Basophils Relative: 0 %
Eosinophils Absolute: 0.1 10*3/uL (ref 0.0–0.5)
Eosinophils Relative: 1 %
HCT: 39.5 % (ref 36.0–46.0)
Hemoglobin: 12.4 g/dL (ref 12.0–15.0)
Immature Granulocytes: 1 %
Lymphocytes Relative: 28 %
Lymphs Abs: 3.6 10*3/uL (ref 0.7–4.0)
MCH: 25.6 pg — AB (ref 26.0–34.0)
MCHC: 31.4 g/dL (ref 30.0–36.0)
MCV: 81.4 fL (ref 80.0–100.0)
MONO ABS: 0.8 10*3/uL (ref 0.1–1.0)
Monocytes Relative: 6 %
Neutro Abs: 8.4 10*3/uL — ABNORMAL HIGH (ref 1.7–7.7)
Neutrophils Relative %: 64 %
Platelets: 360 10*3/uL (ref 150–400)
RBC: 4.85 MIL/uL (ref 3.87–5.11)
RDW: 14 % (ref 11.5–15.5)
WBC: 13.1 10*3/uL — ABNORMAL HIGH (ref 4.0–10.5)
nRBC: 0 % (ref 0.0–0.2)

## 2018-10-11 LAB — TROPONIN I

## 2018-10-11 MED ORDER — SODIUM CHLORIDE 0.9 % IV BOLUS
1000.0000 mL | Freq: Once | INTRAVENOUS | Status: AC
  Administered 2018-10-11: 1000 mL via INTRAVENOUS

## 2018-10-11 MED ORDER — KETOROLAC TROMETHAMINE 30 MG/ML IJ SOLN
15.0000 mg | Freq: Once | INTRAMUSCULAR | Status: AC
  Administered 2018-10-11: 15 mg via INTRAVENOUS
  Filled 2018-10-11: qty 1

## 2018-10-11 MED ORDER — METOCLOPRAMIDE HCL 5 MG/ML IJ SOLN
10.0000 mg | Freq: Once | INTRAMUSCULAR | Status: AC
  Administered 2018-10-11: 10 mg via INTRAVENOUS
  Filled 2018-10-11: qty 2

## 2018-10-11 MED ORDER — ASPIRIN 81 MG PO CHEW: 324.0000 mg | CHEWABLE_TABLET | Freq: Once | ORAL | Status: DC
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
## 2018-10-11 NOTE — ED Notes (Signed)
Pt denies CP, SOB, dizziness. Denies N/V at this time. NAD noted. THis RN will continue to monitor.

## 2018-10-11 NOTE — ED Triage Notes (Signed)
Patient ambulatory to triage with steady gait, without difficulty or distress noted; pt reports mid CP since 130am accomp by [facility]; st hx of same with "panic attack"

## 2018-12-27 ENCOUNTER — Emergency Department: Payer: BLUE CROSS/BLUE SHIELD

## 2018-12-27 ENCOUNTER — Emergency Department
Admission: EM | Admit: 2018-12-27 | Discharge: 2018-12-27 | Disposition: A | Discharge status: Home / Self Care | Payer: BLUE CROSS/BLUE SHIELD | Attending: Emergency Medicine | Admitting: Emergency Medicine

## 2018-12-27 ENCOUNTER — Other Ambulatory Visit: Payer: Self-pay

## 2018-12-27 ENCOUNTER — Encounter: Payer: Self-pay | Admitting: Emergency Medicine

## 2018-12-27 DIAGNOSIS — B349 Viral infection, unspecified: Secondary | ICD-10-CM

## 2018-12-27 DIAGNOSIS — F17210 Nicotine dependence, cigarettes, uncomplicated: Secondary | ICD-10-CM | POA: Insufficient documentation

## 2018-12-27 DIAGNOSIS — Z3202 Encounter for pregnancy test, result negative: Secondary | ICD-10-CM | POA: Insufficient documentation

## 2018-12-27 DIAGNOSIS — I10 Essential (primary) hypertension: Secondary | ICD-10-CM | POA: Insufficient documentation

## 2018-12-27 DIAGNOSIS — Z20828 Contact with and (suspected) exposure to other viral communicable diseases: Secondary | ICD-10-CM | POA: Insufficient documentation

## 2018-12-27 LAB — CBC WITH DIFFERENTIAL/PLATELET
Abs Immature Granulocytes: 0.06 10*3/uL (ref 0.00–0.07)
Basophils Absolute: 0.1 10*3/uL (ref 0.0–0.1)
Basophils Relative: 0 %
Eosinophils Absolute: 0 10*3/uL (ref 0.0–0.5)
Eosinophils Relative: 0 %
HCT: 39.2 % (ref 36.0–46.0)
Hemoglobin: 12.5 g/dL (ref 12.0–15.0)
Immature Granulocytes: 0 %
Lymphocytes Relative: 13 %
Lymphs Abs: 1.9 10*3/uL (ref 0.7–4.0)
MCH: 25.9 pg — ABNORMAL LOW (ref 26.0–34.0)
MCHC: 31.9 g/dL (ref 30.0–36.0)
MCV: 81.3 fL (ref 80.0–100.0)
Monocytes Absolute: 0.8 10*3/uL (ref 0.1–1.0)
Monocytes Relative: 6 %
Neutro Abs: 11.4 10*3/uL — ABNORMAL HIGH (ref 1.7–7.7)
Neutrophils Relative %: 81 %
Platelets: 327 10*3/uL (ref 150–400)
RBC: 4.82 MIL/uL (ref 3.87–5.11)
RDW: 14.3 % (ref 11.5–15.5)
WBC: 14.2 10*3/uL — ABNORMAL HIGH (ref 4.0–10.5)
nRBC: 0 % (ref 0.0–0.2)

## 2018-12-27 LAB — COMPREHENSIVE METABOLIC PANEL
ALT: 47 U/L — ABNORMAL HIGH (ref 0–44)
AST: 29 U/L (ref 15–41)
Albumin: 4.4 g/dL (ref 3.5–5.0)
Alkaline Phosphatase: 102 U/L (ref 38–126)
Anion gap: 10 (ref 5–15)
BUN: 9 mg/dL (ref 6–20)
CO2: 24 mmol/L (ref 22–32)
Calcium: 8.9 mg/dL (ref 8.9–10.3)
Chloride: 102 mmol/L (ref 98–111)
Creatinine, Ser: 0.64 mg/dL (ref 0.44–1.00)
GFR calc Af Amer: >60 mL/min (ref >60)
GFR calc non Af Amer: >60 mL/min (ref >60)
Glucose, Bld: 99 mg/dL (ref 70–99)
Potassium: 3.3 mmol/L — ABNORMAL LOW (ref 3.5–5.1)
Sodium: 136 mmol/L (ref 135–145)
Total Bilirubin: 0.7 mg/dL (ref 0.3–1.2)
Total Protein: 8.6 g/dL — ABNORMAL HIGH (ref 6.5–8.1)

## 2018-12-27 LAB — URINALYSIS, COMPLETE (UACMP) WITH MICROSCOPIC
Bacteria, UA: NONE SEEN
Bilirubin Urine: NEGATIVE
Glucose, UA: NEGATIVE mg/dL
Ketones, ur: 5 mg/dL — AB
Leukocytes,Ua: NEGATIVE
Nitrite: NEGATIVE
Protein, ur: 30 mg/dL — AB
Specific Gravity, Urine: 1.02 (ref 1.005–1.030)
pH: 6 (ref 5.0–8.0)

## 2018-12-27 LAB — SARS CORONAVIRUS 2 BY RT PCR (HOSPITAL ORDER, PERFORMED IN ~~LOC~~ HOSPITAL LAB): SARS Coronavirus 2: NEGATIVE

## 2018-12-27 LAB — POCT PREGNANCY, URINE: Preg Test, Ur: NEGATIVE

## 2018-12-27 MED ORDER — KETOROLAC TROMETHAMINE 30 MG/ML IJ SOLN
30.0000 mg | Freq: Once | INTRAMUSCULAR | Status: AC
  Administered 2018-12-27: 30 mg via INTRAVENOUS
  Filled 2018-12-27: qty 1

## 2018-12-27 NOTE — ED Notes (Signed)
MD at bedside. 

## 2018-12-27 NOTE — ED Notes (Signed)
PT ambulatory to treatment room in NAD at this time.

## 2018-12-27 NOTE — ED Notes (Signed)
MD at bedside to update pt on results.  

## 2018-12-27 NOTE — ED Provider Notes (Signed)
[facility] Emergency Department Provider Note       Time seen: ----------------------------------------- 9:08 PM on 12/27/2018 -----------------------------------------   I have reviewed the triage vital signs and the nursing notes.  HISTORY   Chief Complaint Fever and Weakness    HPI Lori Henry is a 23 y.o. female with a history of anxiety, depression, hypertension, seizures who presents to the ED for fever since this morning starting at approximate 11 AM.  She is had mild cough but she denies any shortness of breath.  She has not had any sick contacts.  Highest fever at home was 100.9.  Past Medical History:  Diagnosis Date  . Anxiety   . Depression   . Hypertension   . Morbid obesity (HCC)   . Seizures (HCC)    Febrile seizures as a child and undiagnosed seizure on 01/2015    There are no active problems to display for this patient.   Past Surgical History:  Procedure Laterality Date  . CHOLECYSTECTOMY N/A 10/23/2016   Procedure: LAPAROSCOPIC CHOLECYSTECTOMY WITH INTRAOPERATIVE CHOLANGIOGRAM;  Surgeon: Nadeen Landau, MD;  Location: [facility] ORS;  Service: General;  Laterality: N/A;  . MOUTH BIOPSY     benign  . MOUTH SURGERY    . WISDOM TOOTH EXTRACTION      Allergies Bee venom  Social History Social History   Tobacco Use  . Smoking status: Light Tobacco Smoker    Packs/day: 0.25    Types: Cigarettes  . Smokeless tobacco: Current User    Types: Chew  Substance Use Topics  . Alcohol use: No  . Drug use: No    Review of Systems Constitutional: Positive for fevers, chills, body aches Cardiovascular: Negative for chest pain. Respiratory: Negative for shortness of breath.  Positive for cough Gastrointestinal: Negative for abdominal pain, vomiting and diarrhea. Musculoskeletal: Negative for back pain. Skin: Negative for rash. Neurological: Positive for weakness  All systems negative/normal/unremarkable except as  stated in the HPI  ____________________________________________   PHYSICAL EXAM:  VITAL SIGNS: ED Triage Vitals  Enc Vitals Group     BP 12/27/18 2030 (!) 150/106     Pulse Rate 12/27/18 2030 (!) 123     Resp 12/27/18 2030 20     Temp 12/27/18 2030 (!) 101.4 F (38.6 C)     Temp Source 12/27/18 2030 Oral     SpO2 12/27/18 2030 100 %     Weight 12/27/18 2031 260 lb (117.9 kg)     Height 12/27/18 2031 5\' 5"  (1.651 m)     Head Circumference --      Peak Flow --      Pain Score 12/27/18 2031 8     Pain Loc --      Pain Edu? --      Excl. in GC? --    Constitutional: Alert and oriented. Well appearing and in no distress. Eyes: Conjunctivae are normal. Normal extraocular movements. ENT      Head: Normocephalic and atraumatic.      Nose: No congestion/rhinnorhea.      Mouth/Throat: Mucous membranes are moist.      Neck: No stridor. Cardiovascular: Rapid rate, regular rhythm. No murmurs, rubs, or gallops. Respiratory: Normal respiratory effort without tachypnea nor retractions. Breath sounds are clear and equal bilaterally. No wheezes/rales/rhonchi. Gastrointestinal: Soft and nontender. Normal bowel sounds Musculoskeletal: Nontender with normal range of motion in extremities. No lower extremity tenderness nor edema. Neurologic:  Normal speech and language. No gross focal neurologic deficits are  appreciated.  Skin:  Skin is warm, dry and intact. No rash noted. Psychiatric: Mood and affect are normal. Speech and behavior are normal.  ____________________________________________  ED COURSE:  As part of my medical decision making, I reviewed the following data within the electronic MEDICAL RECORD NUMBER History obtained from family if available, nursing notes, old chart and ekg, as well as notes from prior ED visits. Patient presented for flulike symptoms, we will assess with labs and imaging as indicated at this time.   Procedures  Lori Henry was evaluated in Emergency  Department on 12/27/2018 for the symptoms described in the history of present illness. She was evaluated in the context of the global COVID-19 pandemic, which necessitated consideration that the patient might be at risk for infection with the SARS-CoV-2 virus that causes COVID-19. Institutional protocols and algorithms that pertain to the evaluation of patients at risk for COVID-19 are in a state of rapid change based on information released by regulatory bodies including the CDC and federal and state organizations. These policies and algorithms were followed during the patient's care in the ED.  ____________________________________________   LABS (pertinent positives/negatives)  Labs Reviewed  CBC WITH DIFFERENTIAL/PLATELET - Abnormal; Notable for the following components:      Result Value   WBC 14.2 (*)    MCH 25.9 (*)    Neutro Abs 11.4 (*)    All other components within normal limits  URINALYSIS, COMPLETE (UACMP) WITH MICROSCOPIC - Abnormal; Notable for the following components:   Color, Urine YELLOW (*)    APPearance HAZY (*)    Hgb urine dipstick SMALL (*)    Ketones, ur 5 (*)    Protein, ur 30 (*)    All other components within normal limits  SARS CORONAVIRUS 2 (HOSPITAL ORDER, PERFORMED IN [facility])  COMPREHENSIVE METABOLIC PANEL  POCT PREGNANCY, URINE  POC URINE PREG, ED    RADIOLOGY  Chest x-ray Does not reveal any acute process ____________________________________________   DIFFERENTIAL DIAGNOSIS   Coronavirus, viral infection, dehydration, electrolyte abnormality, UTI, pneumonia  FINAL ASSESSMENT AND PLAN  Viral illness   Plan: The patient had presented for fever and flulike symptoms. Patient's labs were reassuring other than some leukocytosis. Patient's imaging not reveal any acute process.  She has no other symptoms.  She has tested negative for coronavirus.  She is cleared for outpatient follow-up.   Ulice DashJohnathan E , MD    Note: This  note was generated in part or whole with voice recognition software. Voice recognition is usually quite accurate but there are transcription errors that can and very often do occur. I apologize for any typographical errors that were not detected and corrected.     Emily Filbert,  E, MD 12/27/18 2212

## 2018-12-27 NOTE — ED Triage Notes (Signed)
Patient to ER for c/o fever since this am at approx 1100. Patient reports mild cough, denies shortness of breath. Denies any known sickness in people she has been around. Highest fever at home was 100.9.

## 2018-12-27 NOTE — ED Notes (Signed)
Pt also reporting a headache at this time.

## 2019-01-29 ENCOUNTER — Other Ambulatory Visit: Payer: Self-pay

## 2019-01-29 ENCOUNTER — Encounter: Payer: Self-pay | Admitting: Emergency Medicine

## 2019-01-29 ENCOUNTER — Emergency Department: Payer: Self-pay

## 2019-01-29 ENCOUNTER — Emergency Department
Admission: EM | Admit: 2019-01-29 | Discharge: 2019-01-30 | Disposition: A | Discharge status: Home / Self Care | Payer: Self-pay | Attending: Emergency Medicine | Admitting: Emergency Medicine

## 2019-01-29 DIAGNOSIS — R1031 Right lower quadrant pain: Secondary | ICD-10-CM | POA: Insufficient documentation

## 2019-01-29 DIAGNOSIS — F17220 Nicotine dependence, chewing tobacco, uncomplicated: Secondary | ICD-10-CM | POA: Insufficient documentation

## 2019-01-29 DIAGNOSIS — R112 Nausea with vomiting, unspecified: Secondary | ICD-10-CM | POA: Insufficient documentation

## 2019-01-29 DIAGNOSIS — Z79899 Other long term (current) drug therapy: Secondary | ICD-10-CM | POA: Insufficient documentation

## 2019-01-29 DIAGNOSIS — I10 Essential (primary) hypertension: Secondary | ICD-10-CM | POA: Insufficient documentation

## 2019-01-29 DIAGNOSIS — N200 Calculus of kidney: Secondary | ICD-10-CM | POA: Insufficient documentation

## 2019-01-29 DIAGNOSIS — R0789 Other chest pain: Secondary | ICD-10-CM | POA: Insufficient documentation

## 2019-01-29 DIAGNOSIS — F17210 Nicotine dependence, cigarettes, uncomplicated: Secondary | ICD-10-CM | POA: Insufficient documentation

## 2019-01-29 LAB — COMPREHENSIVE METABOLIC PANEL
ALT: 37 U/L (ref 0–44)
AST: 22 U/L (ref 15–41)
Albumin: 4.6 g/dL (ref 3.5–5.0)
Alkaline Phosphatase: 93 U/L (ref 38–126)
Anion gap: 8 (ref 5–15)
BUN: 10 mg/dL (ref 6–20)
CO2: 25 mmol/L (ref 22–32)
Calcium: 9.5 mg/dL (ref 8.9–10.3)
Chloride: 107 mmol/L (ref 98–111)
Creatinine, Ser: 0.66 mg/dL (ref 0.44–1.00)
GFR calc Af Amer: >60 mL/min (ref >60)
GFR calc non Af Amer: >60 mL/min (ref >60)
Glucose, Bld: 126 mg/dL — ABNORMAL HIGH (ref 70–99)
Potassium: 3.9 mmol/L (ref 3.5–5.1)
Sodium: 140 mmol/L (ref 135–145)
Total Bilirubin: 0.5 mg/dL (ref 0.3–1.2)
Total Protein: 8.5 g/dL — ABNORMAL HIGH (ref 6.5–8.1)

## 2019-01-29 LAB — POCT PREGNANCY, URINE: Preg Test, Ur: NEGATIVE

## 2019-01-29 LAB — TROPONIN I (HIGH SENSITIVITY)
Troponin I (High Sensitivity): <2 ng/L (ref <18)
Troponin I (High Sensitivity): <2 ng/L (ref <18)

## 2019-01-29 LAB — CBC
HCT: 40.1 % (ref 36.0–46.0)
Hemoglobin: 12.6 g/dL (ref 12.0–15.0)
MCH: 26.1 pg (ref 26.0–34.0)
MCHC: 31.4 g/dL (ref 30.0–36.0)
MCV: 83 fL (ref 80.0–100.0)
Platelets: 326 10*3/uL (ref 150–400)
RBC: 4.83 MIL/uL (ref 3.87–5.11)
RDW: 14 % (ref 11.5–15.5)
WBC: 14.1 10*3/uL — ABNORMAL HIGH (ref 4.0–10.5)
nRBC: 0 % (ref 0.0–0.2)

## 2019-01-29 LAB — URINALYSIS, COMPLETE (UACMP) WITH MICROSCOPIC
Bacteria, UA: NONE SEEN
Bilirubin Urine: NEGATIVE
Glucose, UA: NEGATIVE mg/dL
Ketones, ur: NEGATIVE mg/dL
Leukocytes,Ua: NEGATIVE
Nitrite: NEGATIVE
Protein, ur: 100 mg/dL — AB
RBC / HPF: >50 RBC/hpf — ABNORMAL HIGH (ref 0–5)
Specific Gravity, Urine: 1.017 (ref 1.005–1.030)
pH: 6 (ref 5.0–8.0)

## 2019-01-29 LAB — LIPASE, BLOOD: Lipase: 28 U/L (ref 11–51)

## 2019-01-29 MED ORDER — HYDROMORPHONE HCL 1 MG/ML IJ SOLN
1.0000 mg | Freq: Once | INTRAMUSCULAR | Status: AC
  Administered 2019-01-29: 1 mg via INTRAVENOUS
  Filled 2019-01-29: qty 1

## 2019-01-29 MED ORDER — PROMETHAZINE HCL 25 MG PO TABS: 25.0000 mg | ORAL_TABLET | Freq: Four times a day (QID) | ORAL | 0 refills | Status: DC | PRN

## 2019-01-29 MED ORDER — KETOROLAC TROMETHAMINE 30 MG/ML IJ SOLN
15.0000 mg | Freq: Once | INTRAMUSCULAR | Status: AC
  Administered 2019-01-29: 15 mg via INTRAVENOUS
  Filled 2019-01-29: qty 1

## 2019-01-29 MED ORDER — SODIUM CHLORIDE 0.9% FLUSH: 3.0000 mL | Freq: Once | INTRAVENOUS | Status: DC

## 2019-01-29 MED ORDER — OXYCODONE-ACETAMINOPHEN 5-325 MG PO TABS: 1.0000 | ORAL_TABLET | Freq: Four times a day (QID) | ORAL | 0 refills | Status: DC | PRN

## 2019-01-29 MED ORDER — SODIUM CHLORIDE 0.9 % IV BOLUS: 1000.0000 mL | Freq: Once | INTRAVENOUS | Status: DC

## 2019-01-29 MED ORDER — TAMSULOSIN HCL 0.4 MG PO CAPS: 0.4000 mg | ORAL_CAPSULE | Freq: Every day | ORAL | 0 refills | Status: AC

## 2019-01-29 MED ORDER — ONDANSETRON HCL 4 MG/2ML IJ SOLN
4.0000 mg | Freq: Once | INTRAMUSCULAR | Status: AC
  Administered 2019-01-29: 4 mg via INTRAVENOUS
  Filled 2019-01-29: qty 2

## 2019-01-29 MED ORDER — HYDROCODONE-ACETAMINOPHEN 5-325 MG PO TABS
2.0000 | ORAL_TABLET | Freq: Once | ORAL | Status: AC
  Administered 2019-01-30: 2 via ORAL
  Filled 2019-01-29: qty 2

## 2019-01-29 NOTE — ED Notes (Signed)
Rounded in waiting room, pt waiting patiently for treatment room; declined offer of blanket; awake and alert, talking in complete coherent sentences;

## 2019-01-29 NOTE — ED Triage Notes (Signed)
Pt presents to ED via POV with c/o chest pain that started this morning, denies radiation  Of chest pain. Pt states chest pain is substernal and is cramping in nature. Pt c/o abdominal pain x 2 days, pt states several episodes of vomiting since this morning.

## 2019-01-29 NOTE — ED Provider Notes (Signed)
[facility] Emergency Department Provider Note  ____________________________________________   First MD Initiated Contact with Patient 01/29/19 2100     (approximate)  I have reviewed the triage vital signs and the nursing notes.   HISTORY  Chief Complaint Chest Pain and Abdominal Pain    HPI Lori Henry is a 23 y.o. female here with lower abdominal and right flank pain.  The patient states that for the last 2 days, she had acute onset of severe, suprapubic and right lower quadrant pain radiating towards her right back.  She said associated nausea and emesis.  After vomiting today, she does some burning, pressure-like chest pain that is now resolved.  No shortness of breath.  Pain seems worse with palpation and movement.  No leaving factors.  She does note darkened urine compared to usual.  No overt dysuria, however.  No known history of kidney stones.  Denies any vaginal bleeding or discharge.        Past Medical History:  Diagnosis Date   Anxiety    Depression    Hypertension    Morbid obesity ([place])    Seizures ([place])    Febrile seizures as a child and undiagnosed seizure on 01/2015    There are no active problems to display for this patient.   Past Surgical History:  Procedure Laterality Date   CHOLECYSTECTOMY N/A 10/23/2016   Procedure: LAPAROSCOPIC CHOLECYSTECTOMY WITH INTRAOPERATIVE CHOLANGIOGRAM;  Surgeon: Leonie Green, MD;  Location: [facility] ORS;  Service: General;  Laterality: N/A;   MOUTH BIOPSY     benign   MOUTH SURGERY     WISDOM TOOTH EXTRACTION      Prior to Admission medications   Medication Sig Start Date End Date Taking? Authorizing Provider  amLODipine (NORVASC) 5 MG tablet Take 1 tablet (5 mg total) by mouth daily. 06/09/18   Coral Spikes, DO  butalbital-acetaminophen-caffeine (FIORICET, ESGIC) [phone] MG tablet Take 1-2 tablets by mouth every 6 (six) hours as needed. 10/03/18 10/03/19  Rudene Re,  MD  ondansetron (ZOFRAN ODT) 4 MG disintegrating tablet Take 1 tablet (4 mg total) by mouth every 8 (eight) hours as needed. 10/04/18   Eula Listen, MD  oxyCODONE-acetaminophen (PERCOCET) 5-325 MG tablet Take 1-2 tablets by mouth every 6 (six) hours as needed for moderate pain or severe pain. 01/29/19 01/29/20  Duffy Bruce, MD  promethazine (PHENERGAN) 25 MG tablet Take 1 tablet (25 mg total) by mouth every 6 (six) hours as needed for nausea or vomiting. 01/29/19   Duffy Bruce, MD  tamsulosin (FLOMAX) 0.4 MG CAPS capsule Take 1 capsule (0.4 mg total) by mouth daily for 5 days. 01/29/19 02/03/19  Duffy Bruce, MD    Allergies Bee venom  No family history on file.  Social History Social History   Tobacco Use   Smoking status: Light Tobacco Smoker    Packs/day: 0.25    Types: Cigarettes   Smokeless tobacco: Current User    Types: Chew  Substance Use Topics   Alcohol use: No   Drug use: No    Review of Systems  Review of Systems  Constitutional: Positive for fatigue. Negative for fever.  HENT: Negative for congestion and sore throat.   Eyes: Negative for visual disturbance.  Respiratory: Negative for cough and shortness of breath.   Cardiovascular: Negative for chest pain.  Gastrointestinal: Positive for abdominal pain, nausea and vomiting. Negative for diarrhea.  Genitourinary: Negative for flank pain.  Musculoskeletal: Negative for back pain and neck pain.  Skin: Negative for rash and wound.  Neurological: Negative for weakness.  All other systems reviewed and are negative.    ____________________________________________  PHYSICAL EXAM:      VITAL SIGNS: ED Triage Vitals  Enc Vitals Group     BP 01/29/19 1418 (!) 150/112     Pulse Rate 01/29/19 1418 80     Resp 01/29/19 1418 16     Temp 01/29/19 1418 99 F (37.2 C)     Temp Source 01/29/19 1418 Oral     SpO2 01/29/19 1418 98 %     Weight 01/29/19 1409 260 lb (117.9 kg)     Height 01/29/19 1409 5'  5" (1.651 m)     Head Circumference --      Peak Flow --      Pain Score 01/29/19 1409 10     Pain Loc --      Pain Edu? --      Excl. in GC? --      Physical Exam Vitals signs and nursing note reviewed.  Constitutional:      General: She is not in acute distress.    Appearance: She is well-developed.  HENT:     Head: Normocephalic and atraumatic.  Eyes:     Conjunctiva/sclera: Conjunctivae normal.  Neck:     Musculoskeletal: Neck supple.  Cardiovascular:     Rate and Rhythm: Normal rate and regular rhythm.     Heart sounds: Normal heart sounds. No murmur. No friction rub.  Pulmonary:     Effort: Pulmonary effort is normal. No respiratory distress.     Breath sounds: Normal breath sounds. No wheezing or rales.  Abdominal:     General: There is no distension.     Palpations: Abdomen is soft.     Tenderness: There is abdominal tenderness.     Comments: Moderate suprapubic and right lower quadrant tenderness, with right CVA tenderness.  No rebound or guarding.  Skin:    General: Skin is warm.     Capillary Refill: Capillary refill takes less than 2 seconds.  Neurological:     Mental Status: She is alert and oriented to person, place, and time.     Motor: No abnormal muscle tone.       ____________________________________________   LABS (all labs ordered are listed, but only abnormal results are displayed)  Labs Reviewed  COMPREHENSIVE METABOLIC PANEL - Abnormal; Notable for the following components:      Result Value   Glucose, Bld 126 (*)    Total Protein 8.5 (*)    All other components within normal limits  CBC - Abnormal; Notable for the following components:   WBC 14.1 (*)    All other components within normal limits  URINALYSIS, COMPLETE (UACMP) WITH MICROSCOPIC - Abnormal; Notable for the following components:   Color, Urine YELLOW (*)    APPearance CLOUDY (*)    Hgb urine dipstick LARGE (*)    Protein, ur 100 (*)    RBC / HPF >50 (*)    All other  components within normal limits  LIPASE, BLOOD  TROPONIN I (HIGH SENSITIVITY)  TROPONIN I (HIGH SENSITIVITY)  POC URINE PREG, ED  POCT PREGNANCY, URINE    ____________________________________________  EKG: None ________________________________________  RADIOLOGY All imaging, including plain films, CT scans, and ultrasounds, independently reviewed by me, and interpretations confirmed via formal radiology reads.  ED MD interpretation:   CT: 4 mm stone R ureter, no complications  Official radiology report(s): Dg Chest 2  View  Result Date: 01/29/2019 CLINICAL DATA:  Substernal chest pain EXAM: CHEST - 2 VIEW COMPARISON:  12/27/2018 FINDINGS: The heart size and mediastinal contours are within normal limits. Both lungs are clear. The visualized skeletal structures are unremarkable. IMPRESSION: No acute abnormality of the lungs. Electronically Signed   By: Lauralyn PrimesAlex  Bibbey M.D.   On: 01/29/2019 16:23   Ct Renal Stone Study  Result Date: 01/29/2019 CLINICAL DATA:  23 y/o F; right flank pain with vomiting for 2 days. EXAM: CT ABDOMEN AND PELVIS WITHOUT CONTRAST TECHNIQUE: Multidetector CT imaging of the abdomen and pelvis was performed following the standard protocol without IV contrast. COMPARISON:  04/21/2018 CT abdomen and pelvis. FINDINGS: Lower chest: No acute abnormality. Hepatobiliary: Hepatic steatosis. No focal liver abnormality is seen. Status post cholecystectomy. No biliary dilatation. Pancreas: Unremarkable. No pancreatic ductal dilatation or surrounding inflammatory changes. Spleen: Normal in size without focal abnormality. Adrenals/Urinary Tract: Adrenal glands are unremarkable. No focal kidney lesion. Bilateral nonobstructive renal nephrolithiasis. Mild proximal right hydronephrosis with a 4 mm stone in the right mid ureter at the L4 level (series 5, image 71). Stomach/Bowel: Stomach is within normal limits. Appendix appears normal. No evidence of bowel wall thickening, distention, or  inflammatory changes. Vascular/Lymphatic: No significant vascular findings are present. No enlarged abdominal or pelvic lymph nodes. Reproductive: Uterus and bilateral adnexa are unremarkable. Other: No abdominal wall hernia or abnormality. No abdominopelvic ascites. Musculoskeletal: No fracture is seen. IMPRESSION: 1. Mild right hydronephrosis with 4 mm stone in the right mid ureter at L4 level. 2. Bilateral nonobstructive nephrolithiasis. 3. Hepatic steatosis. Electronically Signed   By: Mitzi HansenLance  Furusawa-Stratton M.D.   On: 01/29/2019 22:57    ____________________________________________  PROCEDURES   Procedure(s) performed (including Critical Care):  Procedures  ____________________________________________  INITIAL IMPRESSION / MDM / ASSESSMENT AND PLAN / ED COURSE  As part of my medical decision making, I reviewed the following data within the electronic MEDICAL RECORD NUMBER Notes from prior ED visits and [place] Controlled Substance Database      *Paulino Rilylizabeth O Willert was evaluated in Emergency Department on 01/30/2019 for the symptoms described in the history of present illness. She was evaluated in the context of the global COVID-19 pandemic, which necessitated consideration that the patient might be at risk for infection with the SARS-CoV-2 virus that causes COVID-19. Institutional protocols and algorithms that pertain to the evaluation of patients at risk for COVID-19 are in a state of rapid change based on information released by regulatory bodies including the CDC and federal and state organizations. These policies and algorithms were followed during the patient's care in the ED.  Some ED evaluations and interventions may be delayed as a result of limited staffing during the pandemic.*      Medical Decision Making: 23 yo F here with R flank pain, acute in onset. Imaging shows 4 mm stone in R ureter with mild hydro. UA without UTI. CBC without significant abnormality. No signs of AKi or  complication. Pt feels much better after IVF, analgesics, and is tolerating PO. Will d/c on analgesics, encourage fluids, and refer for outpt follow-up.  ____________________________________________  FINAL CLINICAL IMPRESSION(S) / ED DIAGNOSES  Final diagnoses:  Right kidney stone     MEDICATIONS GIVEN DURING THIS VISIT:  Medications  sodium chloride 0.9 % bolus 1,000 mL (0 mLs Intravenous Stopped 01/29/19 2224)  ketorolac (TORADOL) 30 MG/ML injection 15 mg (15 mg Intravenous Given 01/29/19 2221)  HYDROmorphone (DILAUDID) injection 1 mg (1 mg Intravenous Given 01/29/19 2221)  ondansetron [facility](ZOFRAN) injection 4 mg (4 mg Intravenous Given 01/29/19 2221)  HYDROcodone-acetaminophen (NORCO/VICODIN) 5-325 MG per tablet 2 tablet (2 tablets Oral Given 01/30/19 0056)     ED Discharge Orders         Ordered    oxyCODONE-acetaminophen (PERCOCET) 5-325 MG tablet  Every 6 hours PRN     01/29/19 2354    tamsulosin (FLOMAX) 0.4 MG CAPS capsule  Daily     01/29/19 2354    promethazine (PHENERGAN) 25 MG tablet  Every 6 hours PRN     01/29/19 2354           Note:  This document was prepared using Dragon voice recognition software and may include unintentional dictation errors.   Shaune PollackIsaacs, Kord Monette, MD 01/30/19 [phone]

## 2019-02-13 ENCOUNTER — Other Ambulatory Visit: Payer: Self-pay

## 2019-02-13 DIAGNOSIS — I10 Essential (primary) hypertension: Secondary | ICD-10-CM | POA: Insufficient documentation

## 2019-02-13 DIAGNOSIS — Z9049 Acquired absence of other specified parts of digestive tract: Secondary | ICD-10-CM | POA: Insufficient documentation

## 2019-02-13 DIAGNOSIS — F17210 Nicotine dependence, cigarettes, uncomplicated: Secondary | ICD-10-CM | POA: Insufficient documentation

## 2019-02-13 DIAGNOSIS — Z79899 Other long term (current) drug therapy: Secondary | ICD-10-CM | POA: Insufficient documentation

## 2019-02-13 DIAGNOSIS — N200 Calculus of kidney: Secondary | ICD-10-CM | POA: Insufficient documentation

## 2019-02-13 LAB — BASIC METABOLIC PANEL
Anion gap: 9 (ref 5–15)
BUN: 14 mg/dL (ref 6–20)
CO2: 23 mmol/L (ref 22–32)
Calcium: 9.1 mg/dL (ref 8.9–10.3)
Chloride: 106 mmol/L (ref 98–111)
Creatinine, Ser: 1.14 mg/dL — ABNORMAL HIGH (ref 0.44–1.00)
GFR calc Af Amer: >60 mL/min (ref >60)
GFR calc non Af Amer: >60 mL/min (ref >60)
Glucose, Bld: 94 mg/dL (ref 70–99)
Potassium: 3.5 mmol/L (ref 3.5–5.1)
Sodium: 138 mmol/L (ref 135–145)

## 2019-02-13 LAB — URINALYSIS, COMPLETE (UACMP) WITH MICROSCOPIC
Bilirubin Urine: NEGATIVE
Glucose, UA: NEGATIVE mg/dL
Hgb urine dipstick: NEGATIVE
Ketones, ur: 5 mg/dL — AB
Nitrite: NEGATIVE
Protein, ur: NEGATIVE mg/dL
Specific Gravity, Urine: 1.024 (ref 1.005–1.030)
pH: 6 (ref 5.0–8.0)

## 2019-02-13 LAB — CBC
HCT: 36.6 % (ref 36.0–46.0)
Hemoglobin: 11.8 g/dL — ABNORMAL LOW (ref 12.0–15.0)
MCH: 26.3 pg (ref 26.0–34.0)
MCHC: 32.2 g/dL (ref 30.0–36.0)
MCV: 81.5 fL (ref 80.0–100.0)
Platelets: 361 10*3/uL (ref 150–400)
RBC: 4.49 MIL/uL (ref 3.87–5.11)
RDW: 13.7 % (ref 11.5–15.5)
WBC: 12.7 10*3/uL — ABNORMAL HIGH (ref 4.0–10.5)
nRBC: 0 % (ref 0.0–0.2)

## 2019-02-13 LAB — POCT PREGNANCY, URINE: Preg Test, Ur: NEGATIVE

## 2019-02-13 NOTE — ED Triage Notes (Signed)
Pt to the er for pain in her kidneys. First on the left then on the right., Pt says she was seen here and told she had mutliple stones and says now she is vomiting and losing weight and is in pain all the time. Pt never followed up with urology as instructed. Educated pt in triage on follow up care.

## 2019-02-14 ENCOUNTER — Emergency Department
Admission: EM | Admit: 2019-02-14 | Discharge: 2019-02-14 | Disposition: A | Discharge status: Home / Self Care | Payer: Medicaid Other | Attending: Emergency Medicine | Admitting: Emergency Medicine

## 2019-02-14 DIAGNOSIS — N200 Calculus of kidney: Secondary | ICD-10-CM

## 2019-02-14 DIAGNOSIS — R109 Unspecified abdominal pain: Secondary | ICD-10-CM

## 2019-02-14 MED ORDER — ONDANSETRON HCL 4 MG/2ML IJ SOLN
4.0000 mg | Freq: Once | INTRAMUSCULAR | Status: AC
  Administered 2019-02-14: 04:00:00 4 mg via INTRAVENOUS
  Filled 2019-02-14: qty 2

## 2019-02-14 MED ORDER — HYDROCODONE-ACETAMINOPHEN 5-325 MG PO TABS
1.0000 | ORAL_TABLET | Freq: Once | ORAL | Status: AC
  Administered 2019-02-14: 06:00:00 1 via ORAL
  Filled 2019-02-14: qty 1

## 2019-02-14 MED ORDER — FOSFOMYCIN TROMETHAMINE 3 G PO PACK
3.0000 g | PACK | Freq: Once | ORAL | Status: AC
  Administered 2019-02-14: 04:00:00 3 g via ORAL
  Filled 2019-02-14: qty 3

## 2019-02-14 MED ORDER — TAMSULOSIN HCL 0.4 MG PO CAPS: 0.4000 mg | ORAL_CAPSULE | Freq: Every day | ORAL | 0 refills | Status: DC

## 2019-02-14 MED ORDER — HYDROCODONE-ACETAMINOPHEN 5-325 MG PO TABS: 1.0000 | ORAL_TABLET | Freq: Four times a day (QID) | ORAL | 0 refills | Status: DC | PRN

## 2019-02-14 MED ORDER — KETOROLAC TROMETHAMINE 30 MG/ML IJ SOLN
15.0000 mg | Freq: Once | INTRAMUSCULAR | Status: AC
  Administered 2019-02-14: 04:00:00 15 mg via INTRAVENOUS
  Filled 2019-02-14: qty 1

## 2019-02-14 MED ORDER — ONDANSETRON 4 MG PO TBDP: 4.0000 mg | ORAL_TABLET | Freq: Three times a day (TID) | ORAL | 0 refills | Status: DC | PRN

## 2019-02-14 MED ORDER — SODIUM CHLORIDE 0.9 % IV BOLUS: 1000.0000 mL | Freq: Once | INTRAVENOUS | Status: DC

## 2019-02-14 MED ORDER — OXYCODONE-ACETAMINOPHEN 5-325 MG PO TABS: 1.0000 | ORAL_TABLET | ORAL | 0 refills | Status: DC | PRN

## 2019-02-14 NOTE — ED Provider Notes (Signed)
[facility] Emergency Department Provider Note   ____________________________________________   First MD Initiated Contact with Patient 02/14/19 [phone]     (approximate)  I have reviewed the triage vital signs and the nursing notes.   HISTORY  Chief Complaint Flank Pain    HPI Lori Henry is a 23 y.o. female who presents to the ED with a chief complaint of left flank pain.  Patient was seen at the end of June for right flank pain; had a CT scan that demonstrated nonobstructive right-sided kidney stone but multiple stones in both kidneys.  States now she is hurting on the left x2 days.  Symptoms associated with nausea and vomiting and states she is losing weight.  She did not follow-up with urology as previously instructed.  Denies fever, cough, shortness of breath, hematuria, diarrhea.  Denies recent travel, trauma or exposure to persons diagnosed with coronavirus.       Past Medical History:  Diagnosis Date  . Anxiety   . Depression   . Hypertension   . Morbid obesity ([place])   . Seizures ([place])    Febrile seizures as a child and undiagnosed seizure on 01/2015    There are no active problems to display for this patient.   Past Surgical History:  Procedure Laterality Date  . CHOLECYSTECTOMY N/A 10/23/2016   Procedure: LAPAROSCOPIC CHOLECYSTECTOMY WITH INTRAOPERATIVE CHOLANGIOGRAM;  Surgeon: Leonie Green, MD;  Location: [facility] ORS;  Service: General;  Laterality: N/A;  . MOUTH BIOPSY     benign  . MOUTH SURGERY    . WISDOM TOOTH EXTRACTION      Prior to Admission medications   Medication Sig Start Date End Date Taking? Authorizing Provider  amLODipine (NORVASC) 5 MG tablet Take 1 tablet (5 mg total) by mouth daily. 06/09/18   Coral Spikes, DO  butalbital-acetaminophen-caffeine (FIORICET, ESGIC) [phone] MG tablet Take 1-2 tablets by mouth every 6 (six) hours as needed. 10/03/18 10/03/19  Rudene Re, MD  ondansetron (ZOFRAN ODT) 4  MG disintegrating tablet Take 1 tablet (4 mg total) by mouth every 8 (eight) hours as needed for nausea or vomiting. 02/14/19   Paulette Blanch, MD  oxyCODONE-acetaminophen (PERCOCET/ROXICET) 5-325 MG tablet Take 1 tablet by mouth every 4 (four) hours as needed for severe pain. 02/14/19   Paulette Blanch, MD  promethazine (PHENERGAN) 25 MG tablet Take 1 tablet (25 mg total) by mouth every 6 (six) hours as needed for nausea or vomiting. 01/29/19   Duffy Bruce, MD  tamsulosin (FLOMAX) 0.4 MG CAPS capsule Take 1 capsule (0.4 mg total) by mouth daily. 02/14/19   Paulette Blanch, MD    Allergies Bee venom  No family history on file.  Social History Social History   Tobacco Use  . Smoking status: Light Tobacco Smoker    Packs/day: 0.25    Types: Cigarettes  . Smokeless tobacco: Current User    Types: Chew  Substance Use Topics  . Alcohol use: No  . Drug use: No    Review of Systems  Constitutional: No fever/chills Eyes: No visual changes. ENT: No sore throat. Cardiovascular: Denies chest pain. Respiratory: Denies shortness of breath. Gastrointestinal: Positive for left flank pain.  No abdominal pain.  Positive for nausea and vomiting.  No diarrhea.  No constipation. Genitourinary: Negative for dysuria. Musculoskeletal: Negative for back pain. Skin: Negative for rash. Neurological: Negative for headaches, focal weakness or numbness.   ____________________________________________   PHYSICAL EXAM:  VITAL SIGNS: ED Triage Vitals  Enc Vitals Group     BP 02/13/19 2230 130/70     Pulse Rate 02/13/19 2230 90     Resp 02/13/19 2230 16     Temp 02/13/19 2230 99.1 F (37.3 C)     Temp Source 02/13/19 2230 Oral     SpO2 --      Weight 02/13/19 2232 260 lb (117.9 kg)     Height 02/13/19 2232 5\' 5"  (1.651 m)     Head Circumference --      Peak Flow --      Pain Score 02/13/19 2231 7     Pain Loc --      Pain Edu? --      Excl. in GC? --     Constitutional: Alert and oriented. Well  appearing and in no acute distress. Eyes: Conjunctivae are normal. PERRL. EOMI. Head: Atraumatic. Nose: No congestion/rhinnorhea. Mouth/Throat: Mucous membranes are moist.  Oropharynx non-erythematous. Neck: No stridor.   Cardiovascular: Normal rate, regular rhythm. Grossly normal heart sounds.  Good peripheral circulation. Respiratory: Normal respiratory effort.  No retractions. Lungs CTAB. Gastrointestinal: Soft and nontender to light or deep palpation. No distention. No abdominal bruits. No CVA tenderness. Musculoskeletal: No lower extremity tenderness nor edema.  No joint effusions. Neurologic:  Normal speech and language. No gross focal neurologic deficits are appreciated. No gait instability. Skin:  Skin is warm, dry and intact. No rash noted.  No vesicles. Psychiatric: Mood and affect are normal. Speech and behavior are normal.  ____________________________________________   LABS (all labs ordered are listed, but only abnormal results are displayed)  Labs Reviewed  URINALYSIS, COMPLETE (UACMP) WITH MICROSCOPIC - Abnormal; Notable for the following components:      Result Value   Color, Urine YELLOW (*)    APPearance CLEAR (*)    Ketones, ur 5 (*)    Leukocytes,Ua TRACE (*)    Bacteria, UA FEW (*)    All other components within normal limits  BASIC METABOLIC PANEL - Abnormal; Notable for the following components:   Creatinine, Ser 1.14 (*)    All other components within normal limits  CBC - Abnormal; Notable for the following components:   WBC 12.7 (*)    Hemoglobin 11.8 (*)    All other components within normal limits  POC URINE PREG, ED  POCT PREGNANCY, URINE   ____________________________________________  EKG  None ____________________________________________  RADIOLOGY  ED MD interpretation: None  Official radiology report(s): No results found.  ____________________________________________   PROCEDURES  Procedure(s) performed (including Critical  Care):  Procedures   ____________________________________________   INITIAL IMPRESSION / ASSESSMENT AND PLAN / ED COURSE  As part of my medical decision making, I reviewed the following data within the electronic MEDICAL RECORD NUMBER Nursing notes reviewed and incorporated, Labs reviewed, Old chart reviewed, Radiograph reviewed and Notes from prior ED visits     Lori Henry was evaluated in Emergency Department on 02/14/2019 for the symptoms described in the history of present illness. She was evaluated in the context of the global COVID-19 pandemic, which necessitated consideration that the patient might be at risk for infection with the SARS-CoV-2 virus that causes COVID-19. Institutional protocols and algorithms that pertain to the evaluation of patients at risk for COVID-19 are in a state of rapid change based on information released by regulatory bodies including the CDC and federal and state organizations. These policies and algorithms were followed during the patient's care in the ED.   23 year old female who presents with left  flank pain. Differential diagnosis includes, but is not limited to, ovarian cyst, ovarian torsion, acute appendicitis, diverticulitis, urinary tract infection/pyelonephritis, endometriosis, bowel obstruction, colitis, renal colic, gastroenteritis, hernia, fibroids, endometriosis, pregnancy related pain including ectopic pregnancy, etc.  I personally reviewed patient's ED visit including her CT scan from June 24.  Suspect ureteral colic.  Initiate IV fluid resuscitation, IV Toradol and Zofran.  Fosfomycin given for mild UTI.  Clinical Course as of Feb 14 523  Tue Feb 14, 2019  0524 Patient feeling much better.  Will discharge home Norco, Zofran and Flomax.  I stressed the importance of following up with urology this time.  Strict return precautions given.  Patient verbalizes understanding and agrees with plan of care.   [JS]    Clinical Course User  Index [JS] Irean HongSung, Jeyla Bulger J, MD     ____________________________________________   FINAL CLINICAL IMPRESSION(S) / ED DIAGNOSES  Final diagnoses:  Left flank pain  Nephrolithiasis     ED Discharge Orders         Ordered    tamsulosin (FLOMAX) 0.4 MG CAPS capsule  Daily     02/14/19 0428    oxyCODONE-acetaminophen (PERCOCET/ROXICET) 5-325 MG tablet  Every 4 hours PRN     02/14/19 0428    ondansetron (ZOFRAN ODT) 4 MG disintegrating tablet  Every 8 hours PRN     02/14/19 0428           Note:  This document was prepared using Dragon voice recognition software and may include unintentional dictation errors.   Irean HongSung, Shuayb Schepers J, MD 02/14/19 [phone]

## 2019-02-14 NOTE — Discharge Instructions (Addendum)
1. Take pain & nausea medicines as needed (Norco/Zofran #20). Make sure to take a stool softener while taking narcotic pain medicines. 2. Take Flomax 0.4mg  daily x 14 days. 3. Drink plenty of bottled or filtered water daily. 4. Return to the ER for worsening symptoms, persistent vomiting, fever, difficulty breathing or other concerns.

## 2019-04-30 ENCOUNTER — Emergency Department: Payer: Medicaid Other

## 2019-04-30 ENCOUNTER — Emergency Department
Admission: EM | Admit: 2019-04-30 | Discharge: 2019-04-30 | Disposition: A | Discharge status: Home / Self Care | Payer: Medicaid Other | Attending: Emergency Medicine | Admitting: Emergency Medicine

## 2019-04-30 ENCOUNTER — Other Ambulatory Visit: Payer: Self-pay

## 2019-04-30 DIAGNOSIS — F17210 Nicotine dependence, cigarettes, uncomplicated: Secondary | ICD-10-CM | POA: Insufficient documentation

## 2019-04-30 DIAGNOSIS — Z79899 Other long term (current) drug therapy: Secondary | ICD-10-CM | POA: Insufficient documentation

## 2019-04-30 DIAGNOSIS — B349 Viral infection, unspecified: Secondary | ICD-10-CM | POA: Insufficient documentation

## 2019-04-30 DIAGNOSIS — Z20828 Contact with and (suspected) exposure to other viral communicable diseases: Secondary | ICD-10-CM | POA: Insufficient documentation

## 2019-04-30 DIAGNOSIS — I10 Essential (primary) hypertension: Secondary | ICD-10-CM | POA: Insufficient documentation

## 2019-04-30 DIAGNOSIS — R059 Cough, unspecified: Secondary | ICD-10-CM

## 2019-04-30 DIAGNOSIS — R509 Fever, unspecified: Secondary | ICD-10-CM

## 2019-04-30 DIAGNOSIS — R05 Cough: Secondary | ICD-10-CM

## 2019-04-30 LAB — URINALYSIS, COMPLETE (UACMP) WITH MICROSCOPIC
Bilirubin Urine: NEGATIVE
Glucose, UA: NEGATIVE mg/dL
Hgb urine dipstick: NEGATIVE
Ketones, ur: NEGATIVE mg/dL
Nitrite: NEGATIVE
Protein, ur: NEGATIVE mg/dL
Specific Gravity, Urine: 1.017 (ref 1.005–1.030)
pH: 8 (ref 5.0–8.0)

## 2019-04-30 LAB — CBC WITH DIFFERENTIAL/PLATELET
Abs Immature Granulocytes: 0.07 10*3/uL (ref 0.00–0.07)
Basophils Absolute: 0 10*3/uL (ref 0.0–0.1)
Basophils Relative: 0 %
Eosinophils Absolute: 0 10*3/uL (ref 0.0–0.5)
Eosinophils Relative: 0 %
HCT: 39.4 % (ref 36.0–46.0)
Hemoglobin: 12.6 g/dL (ref 12.0–15.0)
Immature Granulocytes: 1 %
Lymphocytes Relative: 14 %
Lymphs Abs: 1.8 10*3/uL (ref 0.7–4.0)
MCH: 25.9 pg — ABNORMAL LOW (ref 26.0–34.0)
MCHC: 32 g/dL (ref 30.0–36.0)
MCV: 81.1 fL (ref 80.0–100.0)
Monocytes Absolute: 0.9 10*3/uL (ref 0.1–1.0)
Monocytes Relative: 7 %
Neutro Abs: 10.1 10*3/uL — ABNORMAL HIGH (ref 1.7–7.7)
Neutrophils Relative %: 78 %
Platelets: 356 10*3/uL (ref 150–400)
RBC: 4.86 MIL/uL (ref 3.87–5.11)
RDW: 13.8 % (ref 11.5–15.5)
WBC: 13 10*3/uL — ABNORMAL HIGH (ref 4.0–10.5)
nRBC: 0 % (ref 0.0–0.2)

## 2019-04-30 LAB — COMPREHENSIVE METABOLIC PANEL
ALT: 44 U/L (ref 0–44)
AST: 29 U/L (ref 15–41)
Albumin: 4.3 g/dL (ref 3.5–5.0)
Alkaline Phosphatase: 90 U/L (ref 38–126)
Anion gap: 8 (ref 5–15)
BUN: 10 mg/dL (ref 6–20)
CO2: 23 mmol/L (ref 22–32)
Calcium: 9.3 mg/dL (ref 8.9–10.3)
Chloride: 105 mmol/L (ref 98–111)
Creatinine, Ser: 0.69 mg/dL (ref 0.44–1.00)
GFR calc Af Amer: >60 mL/min (ref >60)
GFR calc non Af Amer: >60 mL/min (ref >60)
Glucose, Bld: 102 mg/dL — ABNORMAL HIGH (ref 70–99)
Potassium: 3.5 mmol/L (ref 3.5–5.1)
Sodium: 136 mmol/L (ref 135–145)
Total Bilirubin: 0.6 mg/dL (ref 0.3–1.2)
Total Protein: 8.6 g/dL — ABNORMAL HIGH (ref 6.5–8.1)

## 2019-04-30 LAB — LACTIC ACID, PLASMA: Lactic Acid, Venous: 0.9 mmol/L (ref 0.5–1.9)

## 2019-04-30 LAB — SARS CORONAVIRUS 2 (TAT 6-24 HRS): SARS Coronavirus 2: NEGATIVE

## 2019-04-30 LAB — POCT PREGNANCY, URINE: Preg Test, Ur: NEGATIVE

## 2019-04-30 LAB — GROUP A STREP BY PCR: Group A Strep by PCR: NOT DETECTED

## 2019-04-30 MED ORDER — LACTATED RINGERS IV BOLUS
1000.0000 mL | Freq: Once | INTRAVENOUS | Status: AC
  Administered 2019-04-30: 02:00:00 1000 mL via INTRAVENOUS

## 2019-04-30 MED ORDER — ACETAMINOPHEN 325 MG PO TABS
650.0000 mg | ORAL_TABLET | Freq: Once | ORAL | Status: AC | PRN
  Administered 2019-04-30: 650 mg via ORAL

## 2019-04-30 MED ORDER — KETOROLAC TROMETHAMINE 30 MG/ML IJ SOLN
15.0000 mg | Freq: Once | INTRAMUSCULAR | Status: AC
  Administered 2019-04-30: 15 mg via INTRAVENOUS
  Filled 2019-04-30: qty 1

## 2019-04-30 MED ORDER — ACETAMINOPHEN 325 MG PO TABS
ORAL_TABLET | ORAL | Status: AC
  Filled 2019-04-30: qty 2

## 2019-04-30 NOTE — ED Triage Notes (Addendum)
Pt states has had burning eyes since last pm, sore throat, cough for four days. Pt states "I feel weak, like I can't even open a shampoo bottle". Pt febrile in triage. Pt also complains of "difficulty peeing", urinary frequency and "hot urine".

## 2019-04-30 NOTE — ED Provider Notes (Signed)
[facility] Emergency Department Provider Note   ____________________________________________   First MD Initiated Contact with Patient 04/30/19 0132     (approximate)  I have reviewed the triage vital signs and the nursing notes.   HISTORY  Chief Complaint COVID -19 symptoms    HPI Lori Henry is a 23 y.o. female with past medical history of hypertension who presents to the ED complaining of fever.  Patient reports that she first started feeling malaised yesterday and noted that she had a fever today of 102.8 at home.  This is been associated with headache, sore throat, cough, urinary frequency and dysuria.  She denies any chest pain or shortness of breath and has not had any sick contacts that she is aware of.  She also denies any flank pain or hematuria.  She has not taken anything for her symptoms prior to arrival.        Past Medical History:  Diagnosis Date  . Anxiety   . Depression   . Hypertension   . Morbid obesity (HCC)   . Seizures (HCC)    Febrile seizures as a child and undiagnosed seizure on 01/2015    There are no active problems to display for this patient.   Past Surgical History:  Procedure Laterality Date  . CHOLECYSTECTOMY N/A 10/23/2016   Procedure: LAPAROSCOPIC CHOLECYSTECTOMY WITH INTRAOPERATIVE CHOLANGIOGRAM;  Surgeon: Nadeen LandauJarvis Wilton Smith, MD;  Location: [facility] ORS;  Service: General;  Laterality: N/A;  . MOUTH BIOPSY     benign  . MOUTH SURGERY    . WISDOM TOOTH EXTRACTION      Prior to Admission medications   Medication Sig Start Date End Date Taking? Authorizing Provider  amLODipine (NORVASC) 5 MG tablet Take 1 tablet (5 mg total) by mouth daily. 06/09/18   Tommie Samsook, Jayce G, DO  butalbital-acetaminophen-caffeine (FIORICET, ESGIC) [phone] MG tablet Take 1-2 tablets by mouth every 6 (six) hours as needed. 10/03/18 10/03/19  Nita SickleVeronese, [place], MD  HYDROcodone-acetaminophen [facility](NORCO) 5-325 MG tablet Take 1 tablet by  mouth every 6 (six) hours as needed for moderate pain. 02/14/19   Irean HongSung, Jade J, MD  ondansetron (ZOFRAN ODT) 4 MG disintegrating tablet Take 1 tablet (4 mg total) by mouth every 8 (eight) hours as needed for nausea or vomiting. 02/14/19   Irean HongSung, Jade J, MD  promethazine (PHENERGAN) 25 MG tablet Take 1 tablet (25 mg total) by mouth every 6 (six) hours as needed for nausea or vomiting. 01/29/19   Shaune PollackIsaacs, Cameron, MD  tamsulosin (FLOMAX) 0.4 MG CAPS capsule Take 1 capsule (0.4 mg total) by mouth daily. 02/14/19   Irean HongSung, Jade J, MD    Allergies Bee venom  No family history on file.  Social History Social History   Tobacco Use  . Smoking status: Light Tobacco Smoker    Packs/day: 0.25    Types: Cigarettes  . Smokeless tobacco: Current User    Types: Chew  Substance Use Topics  . Alcohol use: No  . Drug use: No    Review of Systems  Constitutional: Positive for fever and malaise. Eyes: No visual changes. ENT: Positive for sore throat. Cardiovascular: Denies chest pain. Respiratory: Denies shortness of breath.  Positive for cough. Gastrointestinal: No abdominal pain.  No nausea, no vomiting.  No diarrhea.  No constipation. Genitourinary: Positive for for dysuria and urinary frequency. Musculoskeletal: Negative for back pain. Skin: Negative for rash. Neurological: Negative for headaches, focal weakness or numbness.  ____________________________________________   PHYSICAL EXAM:  VITAL SIGNS: ED  Triage Vitals  Enc Vitals Group     BP 04/30/19 0123 (!) 155/102     Pulse Rate 04/30/19 0123 (!) 130     Resp 04/30/19 0123 20     Temp 04/30/19 0123 (!) 102.3 F (39.1 C)     Temp Source 04/30/19 0123 Oral     SpO2 04/30/19 0123 100 %     Weight 04/30/19 0124 260 lb (117.9 kg)     Height 04/30/19 0124 5\' 4"  (1.626 m)     Head Circumference --      Peak Flow --      Pain Score 04/30/19 0124 10     Pain Loc --      Pain Edu? --      Excl. in GC? --     Constitutional: Alert and  oriented. Eyes: Conjunctivae are normal. Head: Atraumatic. Nose: No congestion/rhinnorhea. Mouth/Throat: Mucous membranes are moist.  Bilateral erythema and edema with overlying exudates of posterior oropharynx. Neck: Normal ROM, no cervical lymphadenopathy. Cardiovascular: Tachycardic, regular rhythm. Grossly normal heart sounds. Respiratory: Normal respiratory effort.  No retractions. Lungs CTAB. Gastrointestinal: Soft and nontender. No distention. Genitourinary: deferred Musculoskeletal: No lower extremity tenderness nor edema. Neurologic:  Normal speech and language. No gross focal neurologic deficits are appreciated. Skin:  Skin is warm, dry and intact. No rash noted. Psychiatric: Mood and affect are normal. Speech and behavior are normal.  ____________________________________________   LABS (all labs ordered are listed, but only abnormal results are displayed)  Labs Reviewed  COMPREHENSIVE METABOLIC PANEL - Abnormal; Notable for the following components:      Result Value   Glucose, Bld 102 (*)    Total Protein 8.6 (*)    All other components within normal limits  CBC WITH DIFFERENTIAL/PLATELET - Abnormal; Notable for the following components:   WBC 13.0 (*)    MCH 25.9 (*)    Neutro Abs 10.1 (*)    All other components within normal limits  URINALYSIS, COMPLETE (UACMP) WITH MICROSCOPIC - Abnormal; Notable for the following components:   Color, Urine YELLOW (*)    APPearance HAZY (*)    Leukocytes,Ua TRACE (*)    Bacteria, UA RARE (*)    All other components within normal limits  GROUP A STREP BY PCR  SARS CORONAVIRUS 2 (TAT 6-24 HRS)  LACTIC ACID, PLASMA  LACTIC ACID, PLASMA  POC URINE PREG, ED  POCT PREGNANCY, URINE     PROCEDURES  Procedure(s) performed (including Critical Care):  Procedures   ____________________________________________   INITIAL IMPRESSION / ASSESSMENT AND PLAN / ED COURSE       23 year old female presenting to the ED with fever  and malaise since yesterday with associated headache, sore throat, cough, and urinary symptoms.  She is febrile here with associated tachycardia, however she is very well-appearing and in no distress, do not suspect sepsis.  Will check chest x-ray and UA for evidence of bacterial infection.  Also perform strep testing given her Centor score.  Will hydrate with IV fluids, check labs, including lactate.  If work-up unremarkable for infectious process, will need to perform testing for COVID-19.  Chest x-ray negative for acute process, UA without evidence of infection.  Strep testing is also negative.  Her labs are unremarkable, lactate within normal limits.  Heart rate gradually improving and patient continues to appear well.  Suspect viral syndrome, will perform testing for COVID-19 and counseled patient to quarantine until testing results are known.  Counseled patient to follow-up with PCP  and return to the ED for new or worsening symptoms, patient agrees with plan.      ____________________________________________   FINAL CLINICAL IMPRESSION(S) / ED DIAGNOSES  Final diagnoses:  Fever, unspecified fever cause  Viral syndrome     ED Discharge Orders    None       Note:  This document was prepared using Dragon voice recognition software and may include unintentional dictation errors.   Blake Divine, MD 04/30/19 [phone]

## 2019-06-27 ENCOUNTER — Other Ambulatory Visit: Payer: Self-pay

## 2019-06-27 ENCOUNTER — Encounter: Payer: Self-pay | Admitting: *Deleted

## 2019-06-27 ENCOUNTER — Emergency Department
Admission: EM | Admit: 2019-06-27 | Discharge: 2019-06-27 | Disposition: A | Discharge status: Home / Self Care | Payer: Medicaid Other | Attending: Emergency Medicine | Admitting: Emergency Medicine

## 2019-06-27 ENCOUNTER — Emergency Department: Payer: Medicaid Other

## 2019-06-27 DIAGNOSIS — F17210 Nicotine dependence, cigarettes, uncomplicated: Secondary | ICD-10-CM | POA: Insufficient documentation

## 2019-06-27 DIAGNOSIS — I10 Essential (primary) hypertension: Secondary | ICD-10-CM | POA: Insufficient documentation

## 2019-06-27 DIAGNOSIS — M79672 Pain in left foot: Secondary | ICD-10-CM | POA: Insufficient documentation

## 2019-06-27 DIAGNOSIS — Z79899 Other long term (current) drug therapy: Secondary | ICD-10-CM | POA: Insufficient documentation

## 2019-06-27 MED ORDER — MELOXICAM 15 MG PO TABS: 15.0000 mg | ORAL_TABLET | Freq: Every day | ORAL | 1 refills | Status: AC

## 2019-06-27 NOTE — ED Triage Notes (Signed)
Left foot pain for two days, concentrated at the base of the toes and in the great toe. Denies recent injury. OTC meds at home without relief.  Hx of stress fracture in the same foot.

## 2019-06-27 NOTE — ED Provider Notes (Signed)
[facility] Emergency Department Provider Note  ____________________________________________  Time seen: Approximately 8:57 PM  I have reviewed the triage vital signs and the nursing notes.   HISTORY  Chief Complaint Foot Pain    HPI Lori Henry is a 23 y.o. female presents to the emergency department with pain along the left MTP joint of the left foot.  Patient states that a 50 pound box dropped on her foot around February and was a work related injury that caused a stress fracture.  Patient is concerned that a stress fracture has reformed.  No numbness or tingling in the left foot.  Patient denies new falls or traumas that she knows of.  No other alleviating measures have been attempted.        Past Medical History:  Diagnosis Date  . Anxiety   . Depression   . Hypertension   . Morbid obesity (HCC)   . Seizures (HCC)    Febrile seizures as a child and undiagnosed seizure on 01/2015    There are no active problems to display for this patient.   Past Surgical History:  Procedure Laterality Date  . CHOLECYSTECTOMY N/A 10/23/2016   Procedure: LAPAROSCOPIC CHOLECYSTECTOMY WITH INTRAOPERATIVE CHOLANGIOGRAM;  Surgeon: Nadeen Landau, MD;  Location: [facility] ORS;  Service: General;  Laterality: N/A;  . MOUTH BIOPSY     benign  . MOUTH SURGERY    . WISDOM TOOTH EXTRACTION      Prior to Admission medications   Medication Sig Start Date End Date Taking? Authorizing Provider  amLODipine (NORVASC) 5 MG tablet Take 1 tablet (5 mg total) by mouth daily. 06/09/18   Tommie Sams, DO  butalbital-acetaminophen-caffeine (FIORICET, ESGIC) [phone] MG tablet Take 1-2 tablets by mouth every 6 (six) hours as needed. 10/03/18 10/03/19  Nita Sickle, MD  HYDROcodone-acetaminophen [facility]) 5-325 MG tablet Take 1 tablet by mouth every 6 (six) hours as needed for moderate pain. 02/14/19   Irean Hong, MD  meloxicam (MOBIC) 15 MG tablet Take 1 tablet (15 mg  total) by mouth daily for 7 days. 06/27/19 07/04/19  Orvil Feil, PA-C  ondansetron (ZOFRAN ODT) 4 MG disintegrating tablet Take 1 tablet (4 mg total) by mouth every 8 (eight) hours as needed for nausea or vomiting. 02/14/19   Irean Hong, MD  promethazine (PHENERGAN) 25 MG tablet Take 1 tablet (25 mg total) by mouth every 6 (six) hours as needed for nausea or vomiting. 01/29/19   Shaune Pollack, MD  tamsulosin (FLOMAX) 0.4 MG CAPS capsule Take 1 capsule (0.4 mg total) by mouth daily. 02/14/19   Irean Hong, MD    Allergies Bee venom  No family history on file.  Social History Social History   Tobacco Use  . Smoking status: Light Tobacco Smoker    Packs/day: 0.25    Types: Cigarettes  . Smokeless tobacco: Current User    Types: Chew  Substance Use Topics  . Alcohol use: No  . Drug use: No     Review of Systems  Constitutional: No fever/chills Eyes: No visual changes. No discharge ENT: No upper respiratory complaints. Cardiovascular: no chest pain. Respiratory: no cough. No SOB. Gastrointestinal: No abdominal pain.  No nausea, no vomiting.  No diarrhea.  No constipation. Musculoskeletal: Patient has left foot pain.  Skin: Negative for rash, abrasions, lacerations, ecchymosis. Neurological: Negative for headaches, focal weakness or numbness. ____________________________________________   PHYSICAL EXAM:  VITAL SIGNS: ED Triage Vitals [06/27/19 1900]  Enc Vitals Group  BP (!) 156/81     Pulse Rate (!) 104     Resp 16     Temp 98.7 F (37.1 C)     Temp Source Oral     SpO2 96 %     Weight      Height      Head Circumference      Peak Flow      Pain Score 7     Pain Loc      Pain Edu?      Excl. in GC?      Constitutional: Alert and oriented. Well appearing and in no acute distress. Eyes: Conjunctivae are normal. PERRL. EOMI. Head: Atraumatic. ENT:      Ears: TMs are pearly.       Nose: No congestion/rhinnorhea.      Mouth/Throat: Mucous membranes  are moist.  Neck: No stridor.  No cervical spine tenderness to palpation. Cardiovascular: Normal rate, regular rhythm. Normal S1 and S2.  Good peripheral circulation. Respiratory: Normal respiratory effort without tachypnea or retractions. Lungs CTAB. Good air entry to the bases with no decreased or absent breath sounds. Gastrointestinal: Bowel sounds 4 quadrants. Soft and nontender to palpation. No guarding or rigidity. No palpable masses. No distention. No CVA tenderness. Musculoskeletal: Full range of motion to all extremities. No gross deformities appreciated.  Patient has tenderness to palpation of left first MTP joint.  No ecchymosis or left foot edema.  Palpable dorsalis pedis pulse, left.  Capillary refill less than 2 seconds on the left. Neurologic:  Normal speech and language. No gross focal neurologic deficits are appreciated.  Skin:  Skin is warm, dry and intact. No rash noted. Psychiatric: Mood and affect are normal. Speech and behavior are normal. Patient exhibits appropriate insight and judgement.   ____________________________________________   LABS (all labs ordered are listed, but only abnormal results are displayed)  Labs Reviewed - No data to display ____________________________________________  EKG   ____________________________________________  RADIOLOGY I personally viewed and evaluated these images as part of my medical decision making, as well as reviewing the written report by the radiologist.  Dg Foot Complete Left  Result Date: 06/27/2019 CLINICAL DATA:  Left foot pain. EXAM: LEFT FOOT - COMPLETE 3+ VIEW COMPARISON:  None. FINDINGS: There is no evidence of fracture or dislocation. There is no evidence of arthropathy or other focal bone abnormality. Soft tissues are unremarkable. IMPRESSION: Negative. Electronically Signed   By: Katherine Mantlehristopher  Green M.D.   On: 06/27/2019 19:28    ____________________________________________    PROCEDURES  Procedure(s)  performed:    Procedures    Medications - No data to display   ____________________________________________   INITIAL IMPRESSION / ASSESSMENT AND PLAN / ED COURSE  Pertinent labs & imaging results that were available during my care of the patient were reviewed by me and considered in my medical decision making (see chart for details).  Review of the [place] CSRS was performed in accordance of the [facility] prior to dispensing any controlled drugs.           Assessment and plan Left foot pain 23 year old female presents to the emergency department with acute left foot pain localized to the left MTP joint.  X-ray examination reveals no bony abnormality.  Rest, ice, compression elevation recommended.  Patient was discharged with meloxicam and advised to follow-up with podiatry as needed.  All patient questions were answered.     ____________________________________________  FINAL CLINICAL IMPRESSION(S) / ED DIAGNOSES  Final diagnoses:  Left foot  pain      NEW MEDICATIONS STARTED DURING THIS VISIT:  ED Discharge Orders         Ordered    meloxicam (MOBIC) 15 MG tablet  Daily     06/27/19 2053              This chart was dictated using voice recognition software/Dragon. Despite best efforts to proofread, errors can occur which can change the meaning. Any change was purely unintentional.    Lannie Fields, PA-C 06/27/19 2102    Arta Silence, MD 06/27/19 2238

## 2019-06-27 NOTE — Discharge Instructions (Addendum)
Apply ice to left foot.  Keep foot elevated in the evenings.  Take Meloxicam for pain and inflammation.

## 2019-08-14 ENCOUNTER — Encounter: Payer: Self-pay | Admitting: Psychiatry

## 2019-08-14 ENCOUNTER — Emergency Department
Admission: EM | Admit: 2019-08-14 | Discharge: 2019-08-14 | Disposition: A | Discharge status: Other Acute Inpatient Hospital | Payer: Medicaid Other | Attending: Emergency Medicine | Admitting: Emergency Medicine

## 2019-08-14 ENCOUNTER — Inpatient Hospital Stay
Admission: RE | Admit: 2019-08-14 | Discharge: 2019-08-15 | DRG: 881 | Disposition: A | Discharge status: Home / Self Care | Payer: Self-pay | Source: Intra-hospital | Attending: Psychiatry | Admitting: Psychiatry

## 2019-08-14 ENCOUNTER — Other Ambulatory Visit: Payer: Self-pay

## 2019-08-14 DIAGNOSIS — F410 Panic disorder [episodic paroxysmal anxiety] without agoraphobia: Secondary | ICD-10-CM

## 2019-08-14 DIAGNOSIS — G4700 Insomnia, unspecified: Secondary | ICD-10-CM | POA: Diagnosis present

## 2019-08-14 DIAGNOSIS — I10 Essential (primary) hypertension: Secondary | ICD-10-CM

## 2019-08-14 DIAGNOSIS — F329 Major depressive disorder, single episode, unspecified: Secondary | ICD-10-CM | POA: Insufficient documentation

## 2019-08-14 DIAGNOSIS — F32A Depression, unspecified: Secondary | ICD-10-CM

## 2019-08-14 DIAGNOSIS — Z79899 Other long term (current) drug therapy: Secondary | ICD-10-CM | POA: Insufficient documentation

## 2019-08-14 DIAGNOSIS — F331 Major depressive disorder, recurrent, moderate: Secondary | ICD-10-CM

## 2019-08-14 DIAGNOSIS — F4310 Post-traumatic stress disorder, unspecified: Secondary | ICD-10-CM

## 2019-08-14 DIAGNOSIS — Z915 Personal history of self-harm: Secondary | ICD-10-CM

## 2019-08-14 DIAGNOSIS — R45851 Suicidal ideations: Secondary | ICD-10-CM | POA: Insufficient documentation

## 2019-08-14 DIAGNOSIS — Z6841 Body Mass Index (BMI) 40.0 and over, adult: Secondary | ICD-10-CM

## 2019-08-14 DIAGNOSIS — F17210 Nicotine dependence, cigarettes, uncomplicated: Secondary | ICD-10-CM | POA: Diagnosis present

## 2019-08-14 DIAGNOSIS — Z20822 Contact with and (suspected) exposure to covid-19: Secondary | ICD-10-CM | POA: Insufficient documentation

## 2019-08-14 DIAGNOSIS — F411 Generalized anxiety disorder: Secondary | ICD-10-CM | POA: Diagnosis present

## 2019-08-14 LAB — URINE DRUG SCREEN, QUALITATIVE (ARMC ONLY)
Amphetamines, Ur Screen: NOT DETECTED
Barbiturates, Ur Screen: NOT DETECTED
Benzodiazepine, Ur Scrn: NOT DETECTED
Cannabinoid 50 Ng, Ur ~~LOC~~: POSITIVE — AB
Cocaine Metabolite,Ur ~~LOC~~: NOT DETECTED
MDMA (Ecstasy)Ur Screen: NOT DETECTED
Methadone Scn, Ur: NOT DETECTED
Opiate, Ur Screen: NOT DETECTED
Phencyclidine (PCP) Ur S: NOT DETECTED
Tricyclic, Ur Screen: NOT DETECTED

## 2019-08-14 LAB — COMPREHENSIVE METABOLIC PANEL
ALT: 42 U/L (ref 0–44)
AST: 27 U/L (ref 15–41)
Albumin: 4.1 g/dL (ref 3.5–5.0)
Alkaline Phosphatase: 88 U/L (ref 38–126)
Anion gap: 8 (ref 5–15)
BUN: 11 mg/dL (ref 6–20)
CO2: 26 mmol/L (ref 22–32)
Calcium: 9.3 mg/dL (ref 8.9–10.3)
Chloride: 105 mmol/L (ref 98–111)
Creatinine, Ser: 0.59 mg/dL (ref 0.44–1.00)
GFR calc Af Amer: >60 mL/min (ref >60)
GFR calc non Af Amer: >60 mL/min (ref >60)
Glucose, Bld: 125 mg/dL — ABNORMAL HIGH (ref 70–99)
Potassium: 3.1 mmol/L — ABNORMAL LOW (ref 3.5–5.1)
Sodium: 139 mmol/L (ref 135–145)
Total Bilirubin: 0.4 mg/dL (ref 0.3–1.2)
Total Protein: 8 g/dL (ref 6.5–8.1)

## 2019-08-14 LAB — CBC
HCT: 39 % (ref 36.0–46.0)
Hemoglobin: 12.5 g/dL (ref 12.0–15.0)
MCH: 26.4 pg (ref 26.0–34.0)
MCHC: 32.1 g/dL (ref 30.0–36.0)
MCV: 82.3 fL (ref 80.0–100.0)
Platelets: 336 10*3/uL (ref 150–400)
RBC: 4.74 MIL/uL (ref 3.87–5.11)
RDW: 13.6 % (ref 11.5–15.5)
WBC: 11.9 10*3/uL — ABNORMAL HIGH (ref 4.0–10.5)
nRBC: 0 % (ref 0.0–0.2)

## 2019-08-14 LAB — SALICYLATE LEVEL: Salicylate Lvl: <7 mg/dL — ABNORMAL LOW (ref 7.0–30.0)

## 2019-08-14 LAB — POCT PREGNANCY, URINE: Preg Test, Ur: NEGATIVE

## 2019-08-14 LAB — ETHANOL: Alcohol, Ethyl (B): <10 mg/dL (ref <10)

## 2019-08-14 LAB — ACETAMINOPHEN LEVEL: Acetaminophen (Tylenol), Serum: <10 ug/mL — ABNORMAL LOW (ref 10–30)

## 2019-08-14 LAB — SARS CORONAVIRUS 2 (TAT 6-24 HRS): SARS Coronavirus 2: NEGATIVE

## 2019-08-14 MED ORDER — MAGNESIUM HYDROXIDE 400 MG/5ML PO SUSP: 30.0000 mL | Freq: Every day | ORAL | Status: DC | PRN

## 2019-08-14 MED ORDER — ACETAMINOPHEN 325 MG PO TABS: 650.0000 mg | ORAL_TABLET | Freq: Four times a day (QID) | ORAL | Status: DC | PRN

## 2019-08-14 MED ORDER — ALUM & MAG HYDROXIDE-SIMETH 200-200-20 MG/5ML PO SUSP: 30.0000 mL | ORAL | Status: DC | PRN
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
## 2019-08-14 NOTE — ED Notes (Signed)
Pt. Transferred from Triage to room after dressing out and screening for contraband. Report to include Situation, Background, Assessment and Recommendations from triage RN. Pt. Oriented to Quad including Q15 minute rounds as well as Psychologist, counselling for their protection. Patient is alert and oriented, warm and dry in no acute distress. Patient denies SI, HI, and AVH. She reported that she is having anxiety. Pt. Encouraged to let me know if needs arise.

## 2019-08-14 NOTE — ED Notes (Signed)
Hourly rounding reveals patient awake in room. No complaints, stable, in no acute distress. Q15 minute rounds and monitoring via Security Cameras to continue. 

## 2019-08-14 NOTE — ED Notes (Signed)
Dressed pt out in purple scrubs. Pt belongings: haydudes, socks, jeans, underwear, bra, sweatshirt, earrings, iphone 11 pro max, makeup, 2 vaps, rubber ring, tongue ring. Unable to remove belly button stud.

## 2019-08-14 NOTE — Plan of Care (Signed)
Patient was in the milieu, alert and oriented. Able to express needs and feelings appropriately. Denied suicidal thoughts. Denied hallucinations. Used the phone for a long period and appeared to be restless and anxious but reported that she was fine. Went to her room and did not complain of anything. Was encouraged to talk to staff as needed. Safety precautions maintained as recommended.

## 2019-08-14 NOTE — ED Notes (Signed)
Patient admits to suicidal thoughts states "I wouldn't do any thing but I just think it would be better if I wasn't here."

## 2019-08-14 NOTE — ED Notes (Signed)
Hourly rounding reveals patient sleeping in room. Breakfast tray sat on sink. No complaints, stable, in no acute distress. Q15 minute rounds and monitoring via [employer] to continue.

## 2019-08-14 NOTE — ED Notes (Signed)
Hourly rounding reveals patient in room. No complaints, stable, in no acute distress. Q15 minute rounds and monitoring via [employer] to continue.   
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
## 2019-08-14 NOTE — Progress Notes (Signed)
Patient pleasant and cooperative during admission assessment. Patient denies SI/HI at this time. Patient denies AVH.Patient stated that because of his anxiety and panic attack she came to the hospital. Patient informed of fall risk status, fall risk assessed "low" at this time. Patient oriented to unit/staff/room. Patient denies any questions/concerns at this time. Patient safe on unit with Q15 minute checks for safety. Skin assessment and body search done,no contraband found.
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
## 2019-08-14 NOTE — ED Notes (Signed)
Hourly rounding reveals patient asleep in room. No complaints, stable, in no acute distress. Q15 minute rounds and monitoring via Security Cameras to continue. 
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
## 2019-08-14 NOTE — BH Assessment (Signed)
Patient is to be admitted to [facility] by Dr. Cindi Carbon.  Attending Physician will be Dr. Jola Babinski.   Patient has been assigned to room 316, by [facility] Charge Nurse Gigi.   Intake Paper Work has been signed and placed on patient chart.   ER staff is aware of the admission:  Drinda Butts, ER Secretary    Dr. Scotty Court, ER MD   Annette Stable, Patient's Nurse   Ethelene Browns, Patient Access.

## 2019-08-14 NOTE — ED Notes (Signed)
Hourly rounding reveals patient sleeping in room. No complaints, stable, in no acute distress. Q15 minute rounds and monitoring via Security Cameras to continue. 

## 2019-08-14 NOTE — ED Notes (Signed)
Pt. Transferred to B[facility] from ED to room 5 after screening for contraband. Report to include Situation, Background, Assessment and Recommendations from Hewan RN. Pt. Oriented to unit including Q15 minute rounds as well as the security cameras for their protection. Patient is alert and oriented, warm and dry in no acute distress. Patient denies SI, HI, and AVH. Pt. Encouraged to let me know if needs arise. 

## 2019-08-14 NOTE — ED Notes (Signed)
Hourly rounding reveals patient in room talking to NP. No complaints, stable, in no acute distress. Q15 minute rounds and monitoring via [employer] to continue.

## 2019-08-14 NOTE — Tx Team (Signed)
Initial Treatment Plan 08/14/2019 5:52 PM [name] EHO:[id number]    PATIENT STRESSORS: Financial difficulties Marital or family conflict Traumatic event   PATIENT STRENGTHS: Average or above average intelligence Communication skills Motivation for treatment/growth   PATIENT IDENTIFIED PROBLEMS: Depression 08/14/2019  Panic attack 08/14/2019                   DISCHARGE CRITERIA:  Improved stabilization in mood, thinking, and/or behavior Motivation to continue treatment in a less acute level of care Verbal commitment to aftercare and medication compliance  PRELIMINARY DISCHARGE PLAN: Attend aftercare/continuing care group Return to previous work or school arrangements  PATIENT/FAMILY INVOLVEMENT: This treatment plan has been presented to and reviewed with the patient, ARIAUNNA LONGSWORTH, and/or family member,  The patient and family have been given the opportunity to ask questions and make suggestions.  Leonarda Salon, RN 08/14/2019, 5:52 PM

## 2019-08-15 DIAGNOSIS — F4310 Post-traumatic stress disorder, unspecified: Secondary | ICD-10-CM

## 2019-08-15 DIAGNOSIS — F331 Major depressive disorder, recurrent, moderate: Secondary | ICD-10-CM

## 2019-08-15 DIAGNOSIS — I10 Essential (primary) hypertension: Secondary | ICD-10-CM

## 2019-08-15 LAB — COMPREHENSIVE METABOLIC PANEL
ALT: 41 U/L (ref 0–44)
AST: 25 U/L (ref 15–41)
Albumin: 3.9 g/dL (ref 3.5–5.0)
Alkaline Phosphatase: 79 U/L (ref 38–126)
Anion gap: 9 (ref 5–15)
BUN: 12 mg/dL (ref 6–20)
CO2: 25 mmol/L (ref 22–32)
Calcium: 9.3 mg/dL (ref 8.9–10.3)
Chloride: 108 mmol/L (ref 98–111)
Creatinine, Ser: 0.71 mg/dL (ref 0.44–1.00)
GFR calc Af Amer: >60 mL/min (ref >60)
GFR calc non Af Amer: >60 mL/min (ref >60)
Glucose, Bld: 101 mg/dL — ABNORMAL HIGH (ref 70–99)
Potassium: 3.7 mmol/L (ref 3.5–5.1)
Sodium: 142 mmol/L (ref 135–145)
Total Bilirubin: 0.6 mg/dL (ref 0.3–1.2)
Total Protein: 8.3 g/dL — ABNORMAL HIGH (ref 6.5–8.1)

## 2019-08-15 LAB — CBC
HCT: 40 % (ref 36.0–46.0)
Hemoglobin: 12.8 g/dL (ref 12.0–15.0)
MCH: 26.2 pg (ref 26.0–34.0)
MCHC: 32 g/dL (ref 30.0–36.0)
MCV: 82 fL (ref 80.0–100.0)
Platelets: 332 10*3/uL (ref 150–400)
RBC: 4.88 MIL/uL (ref 3.87–5.11)
RDW: 13.5 % (ref 11.5–15.5)
WBC: 9.8 10*3/uL (ref 4.0–10.5)
nRBC: 0 % (ref 0.0–0.2)

## 2019-08-15 LAB — LIPID PANEL
Cholesterol: 130 mg/dL (ref 0–200)
HDL: 53 mg/dL (ref >40)
LDL Cholesterol: 67 mg/dL (ref 0–99)
Total CHOL/HDL Ratio: 2.5 RATIO
Triglycerides: 52 mg/dL (ref <150)
VLDL: 10 mg/dL (ref 0–40)

## 2019-08-15 LAB — TSH: TSH: 3.822 u[IU]/mL (ref 0.350–4.500)

## 2019-08-15 MED ORDER — LISINOPRIL 5 MG PO TABS: 5.0000 mg | ORAL_TABLET | Freq: Every day | ORAL | 0 refills | Status: DC

## 2019-08-15 MED ORDER — CITALOPRAM HYDROBROMIDE 20 MG PO TABS: 20.0000 mg | ORAL_TABLET | Freq: Every day | ORAL | 0 refills | Status: DC

## 2019-08-15 MED ORDER — CITALOPRAM HYDROBROMIDE 20 MG PO TABS: 20.0000 mg | ORAL_TABLET | Freq: Every day | ORAL | 1 refills | Status: DC

## 2019-08-15 MED ORDER — LISINOPRIL 5 MG PO TABS: 5.0000 mg | ORAL_TABLET | Freq: Every day | ORAL | 1 refills | Status: DC

## 2019-08-15 MED ORDER — CITALOPRAM HYDROBROMIDE 20 MG PO TABS
20.0000 mg | ORAL_TABLET | Freq: Every day | ORAL | Status: DC
  Administered 2019-08-15: 13:00:00 20 mg via ORAL
  Filled 2019-08-15: qty 1

## 2019-08-15 MED ORDER — LISINOPRIL 5 MG PO TABS
5.0000 mg | ORAL_TABLET | Freq: Every day | ORAL | Status: DC
  Administered 2019-08-15: 5 mg via ORAL
  Filled 2019-08-15: qty 1

## 2019-08-15 NOTE — BHH Group Notes (Signed)
Feelings Around Diagnosis 08/15/2019 1PM  Type of Therapy/Topic:  Group Therapy:  Feelings about Diagnosis  Participation Level:  Active   Description of Group:   This group will allow patients to explore their thoughts and feelings about diagnoses they have received. Patients will be guided to explore their level of understanding and acceptance of these diagnoses. Facilitator will encourage patients to process their thoughts and feelings about the reactions of others to their diagnosis and will guide patients in identifying ways to discuss their diagnosis with significant others in their lives. This group will be process-oriented, with patients participating in exploration of their own experiences, giving and receiving support, and processing challenge from other group members.   Therapeutic Goals: 1. Patient will demonstrate understanding of diagnosis as evidenced by identifying two or more symptoms of the disorder 2. Patient will be able to express two feelings regarding the diagnosis 3. Patient will demonstrate their ability to communicate their needs through discussion and/or role play  Summary of Patient Progress: Actively and appropriately participated in session. Pt demonstrated understanding of group topic and completed assignment. Pt interacted appropriately with group members. Pt respected boundaries and demonstrated good insight.    Therapeutic Modalities:   Cognitive Behavioral Therapy Brief Therapy Feelings Identification    Suzan Slick, LCSW 08/15/2019 2:12 PM

## 2019-08-15 NOTE — Discharge Summary (Signed)
Physician Discharge Summary Note  Patient:  Lori Henry is an 24 y.o., female MRN:  [id number] DOB:  [birth year] Patient phone:  [phone] (home)  Patient address:   [address] [place] [address],  Total Time spent with patient: 1 hour  Date of Admission:  08/14/2019 Date of Discharge: August 15, 2019  Reason for Admission: Patient admitted through the emergency room because of acute anxiety and reported suicidal thoughts.  Principal Problem: MDD (major depressive disorder) Discharge Diagnoses: Principal Problem:   MDD (major depressive disorder) Active Problems:   Panic attack   Essential hypertension   PTSD (post-traumatic stress disorder)   Past Psychiatric History: Past history of anxiety possible PTSD recurrent depression.  Prior history of good response to medication and therapy  Past Medical History:  Past Medical History:  Diagnosis Date  . Anxiety   . Depression   . Hypertension   . Morbid obesity ([facility])   . Seizures ([facility])    Febrile seizures as a child and undiagnosed seizure on 01/2015    Past Surgical History:  Procedure Laterality Date  . CHOLECYSTECTOMY N/A 10/23/2016   Procedure: LAPAROSCOPIC CHOLECYSTECTOMY WITH INTRAOPERATIVE CHOLANGIOGRAM;  Surgeon: Nadeen Landau, MD;  Location: [facility] ORS;  Service: General;  Laterality: N/A;  . MOUTH BIOPSY     benign  . MOUTH SURGERY    . WISDOM TOOTH EXTRACTION     Family History: History reviewed. No pertinent family history. Family Psychiatric  History: None known Social History:  Social History   Substance and Sexual Activity  Alcohol Use No     Social History   Substance and Sexual Activity  Drug Use No    Social History   Socioeconomic History  . Marital status: Legally Separated    Spouse name: Not on file  . Number of children: Not on file  . Years of education: Not on file  . Highest education level: Not on file  Occupational History  . Not on file  Tobacco Use   . Smoking status: Light Tobacco Smoker    Packs/day: 0.25    Types: Cigarettes  . Smokeless tobacco: Current User    Types: Chew  Substance and Sexual Activity  . Alcohol use: No  . Drug use: No  . Sexual activity: Yes  Other Topics Concern  . Not on file  Social History Narrative  . Not on file   Social Determinants of Health   Financial Resource Strain:   . Difficulty of Paying Living Expenses: Not on file  Food Insecurity:   . Worried About Programme researcher, broadcasting/film/video in the Last Year: Not on file  . Ran Out of Food in the Last Year: Not on file  Transportation Needs:   . Lack of Transportation (Medical): Not on file  . Lack of Transportation (Non-Medical): Not on file  Physical Activity:   . Days of Exercise per Week: Not on file  . Minutes of Exercise per Session: Not on file  Stress:   . Feeling of Stress : Not on file  Social Connections:   . Frequency of Communication with Friends and Family: Not on file  . Frequency of Social Gatherings with Friends and Family: Not on file  . Attends Religious Services: Not on file  . Active Member of Clubs or Organizations: Not on file  . Attends Banker Meetings: Not on file  . Marital Status: Not on file    Hospital Course: Patient was admitted to the psychiatric ward.  15-minute checks maintained.  Patient did not display any dangerous aggressive or violent behavior.  She was cooperative with treatment during her hospital stay.  Appeared to be engaged and appropriate.  She was agreeable to restarting citalopram for treatment of anxiety and depression.  She was also restarted on lisinopril for high blood pressure.  Patient did not appear to be in acute risk for herself and was requesting discharge.  She was fully agreeable to following up with [facility] after she left the hospital.  Discharged today.  Currently living with a friend.  Follow-up [facility].  Prescriptions provided.  Physical Findings: AIMS:  , ,  ,  ,    CIWA:    COWS:      Musculoskeletal: Strength & Muscle Tone: within normal limits Gait & Station: normal Patient leans: N/A  Psychiatric Specialty Exam: Physical Exam  Nursing note and vitals reviewed. Constitutional: She appears well-developed and well-nourished.  HENT:  Head: Normocephalic and atraumatic.  Eyes: Pupils are equal, round, and reactive to light. Conjunctivae are normal.  Cardiovascular: Regular rhythm and normal heart sounds.  Respiratory: Effort normal. No respiratory distress.  GI: Soft.  Musculoskeletal:        General: Normal range of motion.     Cervical back: Normal range of motion.  Neurological: She is alert.  Skin: Skin is warm and dry.  Psychiatric: She has a normal mood and affect. Her behavior is normal. Judgment and thought content normal.    Review of Systems  Constitutional: Negative.   HENT: Negative.   Eyes: Negative.   Respiratory: Negative.   Cardiovascular: Negative.   Gastrointestinal: Negative.   Musculoskeletal: Negative.   Skin: Negative.   Neurological: Negative.   Psychiatric/Behavioral: Positive for dysphoric mood. The patient is nervous/anxious.     Blood pressure (!) 140/101, pulse 80, temperature 98.5 F (36.9 C), temperature source Oral, resp. rate 18, height 5\' 5"  (1.651 m), weight 117.9 kg, SpO2 98 %.Body mass index is 43.27 kg/m.  General Appearance: Casual  Eye Contact:  Good  Speech:  Clear and Coherent  Volume:  Normal  Mood:  Euthymic  Affect:  Congruent  Thought Process:  Coherent  Orientation:  Full (Time, Place, and Person)  Thought Content:  Logical  Suicidal Thoughts:  No  Homicidal Thoughts:  No  Memory:  Immediate;   Fair Recent;   Fair Remote;   Fair  Judgement:  Fair  Insight:  Fair  Psychomotor Activity:  Normal  Concentration:  Concentration: Fair  Recall:  Fair  Fund of Knowledge:  Fair  Language:  Fair  Akathisia:  No  Handed:  Right  AIMS (if indicated):     Assets:  Desire for  Improvement Housing Physical Health Resilience  ADL's:  Intact  Cognition:  WNL  Sleep:  Number of Hours: 8.25     Have you used any form of tobacco in the last 30 days? (Cigarettes, Smokeless Tobacco, Cigars, and/or Pipes): No  Has this patient used any form of tobacco in the last 30 days? (Cigarettes, Smokeless Tobacco, Cigars, and/or Pipes) Yes, No  Blood Alcohol level:  Lab Results  Component Value Date   ETH <10 08/14/2019   ETH <10 04/21/2018    Metabolic Disorder Labs:  No results found for: HGBA1C, MPG No results found for: PROLACTIN Lab Results  Component Value Date   CHOL 130 08/15/2019   TRIG 52 08/15/2019   HDL 53 08/15/2019   CHOLHDL 2.5 08/15/2019   VLDL 10 08/15/2019   LDLCALC  67 08/15/2019    See Psychiatric Specialty Exam and Suicide Risk Assessment completed by Attending Physician prior to discharge.  Discharge destination:  Home  Is patient on multiple antipsychotic therapies at discharge:  No   Has Patient had three or more failed trials of antipsychotic monotherapy by history:  No  Recommended Plan for Multiple Antipsychotic Therapies: NA  Discharge Instructions    Diet - low sodium heart healthy   Complete by: As directed    Increase activity slowly   Complete by: As directed      Allergies as of 08/15/2019      Reactions   Bee Venom Anaphylaxis      Medication List    STOP taking these medications   amLODipine 5 MG tablet Commonly known as: NORVASC   butalbital-acetaminophen-caffeine 50-325-40 MG tablet Commonly known as: FIORICET   HYDROcodone-acetaminophen 5-325 MG tablet Commonly known as: Norco   ondansetron 4 MG disintegrating tablet Commonly known as: Zofran ODT   promethazine 25 MG tablet Commonly known as: PHENERGAN   tamsulosin 0.4 MG Caps capsule Commonly known as: Flomax     TAKE these medications     Indication  citalopram 20 MG tablet Commonly known as: CELEXA Take 1 tablet (20 mg total) by mouth  daily.  Indication: Depression, Posttraumatic Stress Disorder   lisinopril 5 MG tablet Commonly known as: ZESTRIL Take 1 tablet (5 mg total) by mouth daily.  Indication: High Blood Pressure Disorder      Follow-up Information    [place] Follow up.   Why: You are scheduled to meet with Sherrian Divers via zoom on Tuesday, January 19th at 7am. Thank you. Contact information: [place] [address] [phone]           Follow-up recommendations:  Activity:  Activity as tolerated Diet:  Regular diet Other:  Follow-up outpatient treatment with [facility]  Comments: Prescriptions given at discharge  Signed: Alethia Berthold, MD 08/15/2019, 12:07 PM
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
## 2019-08-15 NOTE — Progress Notes (Signed)
Patient slept throughout the night. Woke up with anxiety and shakiness and stated "I am always anxious...". Denied thoughts of self harm/hallucinations. Pleasant upon approach. Was encouraged to express her feelings and concerns upon evaluation by attending provider.

## 2019-08-15 NOTE — Progress Notes (Signed)
  [facility] Adult Case Management Discharge Plan :  Will you be returning to the same living situation after discharge:  Yes,  pt says she will return to boarding home and her friend will rent her a room At discharge, do you have transportation home?: Yes,  friend will pick up Do you have the ability to pay for your medications: No.  Release of information consent forms completed and in the chart;  Patient's signature needed at discharge.  Patient to Follow up at: Follow-up Information    [facility] Follow up.   Why: You are scheduled to meet with Unk Pinto via zoom on Tuesday, January 19th at 7am. Thank you. Contact information: [address] [place] [address] [phone]           Next level of care provider has access to [facility] Link:no  Safety Planning and Suicide Prevention discussed: Yes,  with pt; declined family contact  Have you used any form of tobacco in the last 30 days? (Cigarettes, Smokeless Tobacco, Cigars, and/or Pipes): No  Has patient been referred to the Quitline?: N/A patient is not a smoker  Patient has been referred for addiction treatment: N/A  Suzan Slick, LCSW 08/15/2019, 12:25 PM

## 2019-08-15 NOTE — Progress Notes (Signed)
Recreation Therapy Notes  Date: 08/15/2019  Time: 9:30 am   Location: Craft room   Behavioral response: N/A   Intervention Topic: Self-care   Discussion/Intervention: Patient did not attend group.   Clinical Observations/Feedback:  Patient did not attend group.   Hairo Garraway LRT/CTRS        Mikhi Athey 08/15/2019 10:57 AM
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
## 2019-08-15 NOTE — Progress Notes (Signed)
Patient ID: Lori Henry, female   DOB: 08-13-1995, 24 y.o.   MRN: [id number]  Discharge Note:  Patient denies SI/HI/AVH at this time. Discharge instructions, AVS, prescriptions, and transition record gone over with patient. Patient agrees to comply with medication management, follow-up visit, and outpatient therapy. Patient belongings returned to patient. Patient questions and concerns addressed and answered. Patient ambulatory off unit. Patient discharged to home with her best friend.

## 2019-08-15 NOTE — Tx Team (Addendum)
Interdisciplinary Treatment and Diagnostic Plan Update  08/15/2019 Time of Session: 9am Lori Henry MRN: [id number]  Principal Diagnosis: <principal problem not specified>  Secondary Diagnoses: Active Problems:   MDD (major depressive disorder)   Current Medications:  Current Facility-Administered Medications  Medication Dose Route Frequency Provider Last Rate Last Admin  . acetaminophen (TYLENOL) tablet 650 mg  650 mg Oral Q6H PRN Cristofano, Dorene Ar, MD      . alum & mag hydroxide-simeth (MAALOX/MYLANTA) 200-200-20 MG/5ML suspension 30 mL  30 mL Oral Q4H PRN Cristofano, Paul A, MD      . magnesium hydroxide (MILK OF MAGNESIA) suspension 30 mL  30 mL Oral Daily PRN Cristofano, Dorene Ar, MD       PTA Medications: Medications Prior to Admission  Medication Sig Dispense Refill Last Dose  . amLODipine (NORVASC) 5 MG tablet Take 1 tablet (5 mg total) by mouth daily. 90 tablet 0   . butalbital-acetaminophen-caffeine (FIORICET, ESGIC) 50-325-40 MG tablet Take 1-2 tablets by mouth every 6 (six) hours as needed. 20 tablet 0   . HYDROcodone-acetaminophen (NORCO) 5-325 MG tablet Take 1 tablet by mouth every 6 (six) hours as needed for moderate pain. 20 tablet 0   . ondansetron (ZOFRAN ODT) 4 MG disintegrating tablet Take 1 tablet (4 mg total) by mouth every 8 (eight) hours as needed for nausea or vomiting. 20 tablet 0   . promethazine (PHENERGAN) 25 MG tablet Take 1 tablet (25 mg total) by mouth every 6 (six) hours as needed for nausea or vomiting. 20 tablet 0   . tamsulosin (FLOMAX) 0.4 MG CAPS capsule Take 1 capsule (0.4 mg total) by mouth daily. 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   Physician Treatment Plan for Primary Diagnosis: <principal problem not specified> Long Term Goal(s):     Short Term Goals:    Medication Management: Evaluate patient's response, side effects, and tolerance of medication regimen.  Therapeutic Interventions: 1 to 1 sessions, Unit Group sessions and Medication administration.  Evaluation of Outcomes: Progressing  Physician Treatment Plan for Secondary Diagnosis: Active Problems:   MDD (major depressive disorder)  Long Term Goal(s):     Short Term Goals:       Medication Management: Evaluate patient's response, side effects, and tolerance of medication regimen.  Therapeutic Interventions: 1 to 1 sessions, Unit Group sessions and Medication administration.  Evaluation of Outcomes: Progressing   RN Treatment Plan for Primary Diagnosis: <principal problem not specified> Long Term Goal(s): Knowledge of disease and therapeutic regimen to maintain health will improve  Short Term Goals: Ability to participate in decision making will improve, Ability to identify and develop effective coping behaviors will improve and Compliance with prescribed medications will improve  Medication Management: RN will administer medications as ordered by provider, will assess and evaluate patient's response and provide education to patient for prescribed medication. RN will report any adverse and/or side effects to prescribing provider.  Therapeutic Interventions: 1 on 1 counseling sessions, Psychoeducation, Medication administration, Evaluate responses to treatment, Monitor vital signs and CBGs as ordered, Perform/monitor CIWA, COWS, AIMS and Fall Risk screenings as ordered, Perform wound care treatments as ordered.  Evaluation of Outcomes: Progressing   LCSW Treatment Plan for Primary Diagnosis: <principal problem not specified> Long Term Goal(s):  Safe transition to appropriate next level of care at discharge, Engage patient in  therapeutic group addressing interpersonal concerns.  Short Term Goals: Engage patient in aftercare planning with referrals and resources  Therapeutic Interventions: Assess for all discharge needs, 1 to 1 time with Social worker, Explore available resources and support systems, Assess for adequacy in community support network, Educate family and significant other(s) on suicide prevention, Complete Psychosocial Assessment, Interpersonal group therapy.  Evaluation of Outcomes: Progressing   Progress in Treatment: Attending groups: Yes. Participating in groups: Yes. Taking medication as prescribed: Yes. Toleration medication: Yes. Family/Significant other contact made: Yes, individual(s) contacted:  pt declined Patient understands diagnosis: Yes. Discussing patient identified problems/goals with staff: Yes. Medical problems stabilized or resolved: Yes. Denies suicidal/homicidal ideation: Yes. Issues/concerns per patient self-inventory: No. Other: NA  New problem(s) identified: No, Describe:  None reported  New Short Term/Long Term Goal(s):Attend outpatient treatment, take medication as prescribed, develop and implement healthy coping methods  Patient Goals:  "Get help with my anxiety"  Discharge Plan or Barriers: Pt will return home and follow up with [facility].  Reason for Continuation of Hospitalization: Anxiety Medication stabilization  Estimated Length of Stay:1-7 days  Recreational Therapy: Patient Stressors: N/A  Patient Goal: Patient will engage in groups without prompting or encouragement from LRT x3 group sessions within 5 recreation therapy group sessions  Attendees: Patient:Lori Henry 08/15/2019 10:41 AM  Physician: Mordecai Rasmussen 08/15/2019 10:41 AM  Nursing: Mickeal Needy [place] 08/15/2019 10:41 AM  RN Care Manager: 08/15/2019 10:41 AM  Social Worker: Lady Gary Moton [place] 08/15/2019 10:41 AM  Recreational Therapist: Danella Deis Johnrobert [place] 08/15/2019  10:41 AM  Other:  08/15/2019 10:41 AM  Other:  08/15/2019 10:41 AM  Other: 08/15/2019 10:41 AM    Scribe for Treatment Team: Suzan Slick, LCSW 08/15/2019 10:41 AM

## 2019-08-15 NOTE — [facility] Suicide Risk Assessment (Signed)
[facility] INPATIENT:  Family/Significant Other Suicide Prevention Education  Suicide Prevention Education:  Patient Refusal for Family/Significant Other Suicide Prevention Education: The patient Lori Henry has refused to provide written consent for family/significant other to be provided Family/Significant Other Suicide Prevention Education during admission and/or prior to discharge.  Physician notified.  Orabelle Rylee T Kaylinn Dedic 08/15/2019, 10:38 AM

## 2019-08-15 NOTE — Progress Notes (Signed)
D- Patient alert and oriented. Patient presents in an anxious, but pleasant mood on assessment reporting that she slept "fair" last night and had no complaints to voice to this Clinical research associate. Patient reported depression/anxiety on her self-inventory, however, she did not elaborate on why she felt this way. Patient denies SI, HI, AVH, and pain at this time. Patient's goal for today is "talking to the Psychiatrist", in which she will "try and focus on one task instead of many", in order to accomplish her goal.  A- Scheduled medications administered to patient, per MD orders. Support and encouragement provided.  Routine safety checks conducted every 15 minutes.  Patient informed to notify staff with problems or concerns.  R- No adverse drug reactions noted. Patient contracts for safety at this time. Patient compliant with medications and treatment plan. Patient receptive, calm, and cooperative. Patient interacts well with others on the unit.  Patient remains safe at this time.
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
## 2019-10-14 ENCOUNTER — Encounter: Payer: Self-pay | Admitting: Emergency Medicine

## 2019-10-14 ENCOUNTER — Emergency Department
Admission: EM | Admit: 2019-10-14 | Discharge: 2019-10-14 | Disposition: A | Discharge status: Home / Self Care | Payer: Medicaid Other | Attending: Emergency Medicine | Admitting: Emergency Medicine

## 2019-10-14 ENCOUNTER — Other Ambulatory Visit: Payer: Self-pay

## 2019-10-14 DIAGNOSIS — F17210 Nicotine dependence, cigarettes, uncomplicated: Secondary | ICD-10-CM | POA: Insufficient documentation

## 2019-10-14 DIAGNOSIS — J039 Acute tonsillitis, unspecified: Secondary | ICD-10-CM | POA: Insufficient documentation

## 2019-10-14 DIAGNOSIS — I10 Essential (primary) hypertension: Secondary | ICD-10-CM | POA: Insufficient documentation

## 2019-10-14 DIAGNOSIS — R300 Dysuria: Secondary | ICD-10-CM

## 2019-10-14 DIAGNOSIS — Z9049 Acquired absence of other specified parts of digestive tract: Secondary | ICD-10-CM | POA: Insufficient documentation

## 2019-10-14 DIAGNOSIS — H6501 Acute serous otitis media, right ear: Secondary | ICD-10-CM

## 2019-10-14 DIAGNOSIS — Z79899 Other long term (current) drug therapy: Secondary | ICD-10-CM | POA: Insufficient documentation

## 2019-10-14 LAB — URINALYSIS, COMPLETE (UACMP) WITH MICROSCOPIC
Bilirubin Urine: NEGATIVE
Glucose, UA: NEGATIVE mg/dL
Ketones, ur: NEGATIVE mg/dL
Nitrite: NEGATIVE
Protein, ur: NEGATIVE mg/dL
Specific Gravity, Urine: 1.015 (ref 1.005–1.030)
pH: 7 (ref 5.0–8.0)

## 2019-10-14 LAB — WET PREP, GENITAL
Clue Cells Wet Prep HPF POC: NONE SEEN
Sperm: NONE SEEN
Trich, Wet Prep: NONE SEEN
Yeast Wet Prep HPF POC: NONE SEEN

## 2019-10-14 LAB — POCT PREGNANCY, URINE: Preg Test, Ur: NEGATIVE

## 2019-10-14 LAB — CHLAMYDIA/NGC RT PCR (ARMC ONLY)
Chlamydia Tr: NOT DETECTED
N gonorrhoeae: NOT DETECTED

## 2019-10-14 LAB — GROUP A STREP BY PCR: Group A Strep by PCR: NOT DETECTED

## 2019-10-14 MED ORDER — PREDNISONE 10 MG (21) PO TBPK: ORAL_TABLET | ORAL | 0 refills | Status: DC

## 2019-10-14 MED ORDER — DEXAMETHASONE SODIUM PHOSPHATE 10 MG/ML IJ SOLN
10.0000 mg | Freq: Once | INTRAMUSCULAR | Status: AC
  Administered 2019-10-14: 10 mg via INTRAMUSCULAR
  Filled 2019-10-14: qty 1

## 2019-10-14 NOTE — Discharge Instructions (Signed)
Your exam an labs do not reveal strep throat or a UTI. Your urine is being cultured, and you will be notified if antibiotics are needed. Take the prescription steroid for sore throat and ear fluid relief. You should follow-up with your provider for further care. Return to the ED as needed.

## 2019-10-14 NOTE — ED Provider Notes (Signed)
[facility] Emergency Department Provider Note ____________________________________________  Time seen: 1126  I have reviewed the triage vital signs and the nursing notes.  HISTORY  Chief Complaint  Sore Throat, Chills, and Dysuria  HPI Lori Henry is a 24 y.o. female presents with self to the ED for evaluation of a 1 week complaint of vaginal irritation concerning for yeast vaginitis, and a 1 day complaint of sore throat.  Patient has been using over-the-counter yeast medications without significant benefit.  She denies any sexual encounters or concerning exposures.  She also denies any irregular menstrual bleeding.  She has been taking ibuprofen over the last several days for subjective fevers.  She denies any sick contacts or recent travel.  Past Medical History:  Diagnosis Date  . Anxiety   . Depression   . Hypertension   . Morbid obesity ([place])   . Seizures ([place])    Febrile seizures as a child and undiagnosed seizure on 01/2015    Patient Active Problem List   Diagnosis Date Noted  . Essential hypertension 08/15/2019  . PTSD (post-traumatic stress disorder) 08/15/2019  . MDD (major depressive disorder) 08/14/2019  . Panic attack     Past Surgical History:  Procedure Laterality Date  . CHOLECYSTECTOMY N/A 10/23/2016   Procedure: LAPAROSCOPIC CHOLECYSTECTOMY WITH INTRAOPERATIVE CHOLANGIOGRAM;  Surgeon: Leonie Green, MD;  Location: [facility] ORS;  Service: General;  Laterality: N/A;  . MOUTH BIOPSY     benign  . MOUTH SURGERY    . WISDOM TOOTH EXTRACTION      Prior to Admission medications   Medication Sig Start Date End Date Taking? Authorizing Provider  citalopram (CELEXA) 20 MG tablet Take 1 tablet (20 mg total) by mouth daily. 08/15/19   Clapacs, Madie Reno, MD  lisinopril (ZESTRIL) 5 MG tablet Take 1 tablet (5 mg total) by mouth daily. 08/15/19   Clapacs, Madie Reno, MD  predniSONE (STERAPRED UNI-PAK 21 TAB) 10 MG (21) TBPK tablet 6-day taper  as directed. 10/14/19   Vivia Rosenburg, Dannielle Karvonen, PA-C    Allergies Bee venom  History reviewed. No pertinent family history.  Social History Social History   Tobacco Use  . Smoking status: Light Tobacco Smoker    Packs/day: 0.25    Types: Cigarettes  . Smokeless tobacco: Current User    Types: Chew  Substance Use Topics  . Alcohol use: No  . Drug use: No    Review of Systems  Constitutional: Negative for fever. Eyes: Negative for visual changes. ENT: Positive for sore throat. Cardiovascular: Negative for chest pain. Respiratory: Negative for shortness of breath. Gastrointestinal: Negative for abdominal pain, vomiting and diarrhea. Genitourinary: Positive for dysuria and vaginal discharge. Musculoskeletal: Negative for back pain. Skin: Negative for rash. Neurological: Negative for headaches, focal weakness or numbness. ____________________________________________  PHYSICAL EXAM:  VITAL SIGNS: ED Triage Vitals  Enc Vitals Group     BP 10/14/19 1101 (!) 149/110     Pulse Rate 10/14/19 1101 (!) 122     Resp 10/14/19 1101 16     Temp 10/14/19 1101 99.7 F (37.6 C)     Temp Source 10/14/19 1101 Oral     SpO2 10/14/19 1101 97 %     Weight 10/14/19 1102 260 lb (117.9 kg)     Height 10/14/19 1102 5\' 5"  (1.651 m)     Head Circumference --      Peak Flow --      Pain Score 10/14/19 1100 0     Pain  Loc --      Pain Edu? --      Excl. in GC? --     Constitutional: Alert and oriented. Well appearing and in no distress. Head: Normocephalic and atraumatic. Eyes: Conjunctivae are normal. PERRL. Normal extraocular movements Ears: Canals clear. TMs intact bilaterally. Nose: No congestion/rhinorrhea/epistaxis. Mouth/Throat: Mucous membranes are moist.  Uvula is midline and tonsils are enlarged.  No significant erythema is appreciated.  No exudates are noted.  No brawny sublingual edema is appreciated. Neck: Supple. No thyromegaly. Hematological/Lymphatic/Immunological: No  cervical lymphadenopathy. Cardiovascular: Normal rate, regular rhythm. Normal distal pulses. Respiratory: Normal respiratory effort. No wheezes/rales/rhonchi. GU: Normal external genitalia.  Vaginal canal with scant whitish discharge.  Cervical os is closed with no friability appreciated.  No adnexal masses noted. Musculoskeletal: Nontender with normal range of motion in all extremities.  Neurologic:  Normal gait without ataxia. Normal speech and language. No gross focal neurologic deficits are appreciated. ____________________________________________   LABS (pertinent positives/negatives)  Labs Reviewed  WET PREP, GENITAL - Abnormal; Notable for the following components:      Result Value   WBC, Wet Prep HPF POC FEW (*)    All other components within normal limits  URINALYSIS, COMPLETE (UACMP) WITH MICROSCOPIC - Abnormal; Notable for the following components:   Color, Urine YELLOW (*)    APPearance HAZY (*)    Hgb urine dipstick SMALL (*)    Leukocytes,Ua TRACE (*)    Bacteria, UA RARE (*)    All other components within normal limits  CHLAMYDIA/NGC RT PCR ([facility] ONLY)  GROUP A STREP BY PCR  URINE CULTURE  POC URINE PREG, ED  POCT PREGNANCY, URINE  ____________________________________________  PROCEDURES  Decadron 10 mg IM Procedures ____________________________________________  INITIAL IMPRESSION / ASSESSMENT AND PLAN / ED COURSE  Patient with ED evaluation of complaints including sore throat and dysuria.  Her rapid strep test was negative and her urinalysis did not reveal any acute leukocyturia.  Given the patient's ongoing symptoms I will send the urine for culture.  Patient will be treated with steroids at this time, and is encouraged to follow-up with her primary provider or gynecologist for ongoing symptom management.  Return precautions have been reviewed.  Lori Henry was evaluated in Emergency Department on 10/14/2019 for the symptoms described in the  history of present illness. She was evaluated in the context of the global COVID-19 pandemic, which necessitated consideration that the patient might be at risk for infection with the SARS-CoV-2 virus that causes COVID-19. Institutional protocols and algorithms that pertain to the evaluation of patients at risk for COVID-19 are in a state of rapid change based on information released by regulatory bodies including the CDC and federal and state organizations. These policies and algorithms were followed during the patient's care in the ED. ____________________________________________  FINAL CLINICAL IMPRESSION(S) / ED DIAGNOSES  Final diagnoses:  Tonsillitis  Non-recurrent acute serous otitis media of right ear  Dysuria      Lissa Hoard, PA-C 10/14/19 2016    Jene Every, MD 10/15/19 [phone]

## 2019-10-14 NOTE — ED Triage Notes (Signed)
Pt to ER with c/o feeling hot and then cold, pain with urination and sore throat for last 2 days.  Pt states subjective fever at home.

## 2019-10-16 LAB — URINE CULTURE
Culture: >=100000 — AB
Special Requests: NORMAL

## 2019-10-18 NOTE — Progress Notes (Addendum)
ED Antimicrobial Stewardship Positive Culture Follow Up   Lori Henry is an 24 y.o. female who presented to [facility] on 10/14/2019 with a chief complaint of  Chief Complaint  Patient presents with  . Sore Throat  . Chills  . Dysuria    Recent Results (from the past 720 hour(s))  Urine culture     Status: Abnormal   Collection Time: 10/14/19 11:07 AM   Specimen: Urine, Clean Catch  Result Value Ref Range Status   Specimen Description   Final    URINE, CLEAN CATCH Performed at [facility], [address] [place], [place] [address]    Special Requests   Final    Normal Performed at [facility], [address] [place], [place] [address]    Culture >=100,000 COLONIES/mL ESCHERICHIA COLI (A)  Final   Report Status 10/16/2019 FINAL  Final   Organism ID, Bacteria ESCHERICHIA COLI (A)  Final      Susceptibility   Escherichia coli - MIC*    AMPICILLIN 8 SENSITIVE Sensitive     CEFAZOLIN <=4 SENSITIVE Sensitive     CEFTRIAXONE <=0.25 SENSITIVE Sensitive     CIPROFLOXACIN <=0.25 SENSITIVE Sensitive     GENTAMICIN <=1 SENSITIVE Sensitive     IMIPENEM <=0.25 SENSITIVE Sensitive     NITROFURANTOIN <=16 SENSITIVE Sensitive     TRIMETH/SULFA <=20 SENSITIVE Sensitive     AMPICILLIN/SULBACTAM 4 SENSITIVE Sensitive     PIP/TAZO <=4 SENSITIVE Sensitive     * >=100,000 COLONIES/mL ESCHERICHIA COLI  Wet prep, genital     Status: Abnormal   Collection Time: 10/14/19 11:50 AM  Result Value Ref Range Status   Yeast Wet Prep HPF POC NONE SEEN NONE SEEN Final   Trich, Wet Prep NONE SEEN NONE SEEN Final   Clue Cells Wet Prep HPF POC NONE SEEN NONE SEEN Final   WBC, Wet Prep HPF POC FEW (A) NONE SEEN Final   Sperm NONE SEEN  Final    Comment: Performed at [facility], [address] [place], [place] [address]  Chlamydia/NGC rt PCR [facility] only)     Status: None   Collection Time: 10/14/19 11:50 AM   Specimen: Cervical/Vaginal swab; Genital  Result  Value Ref Range Status   Specimen source GC/Chlam ENDOCERVICAL  Final   Chlamydia Tr NOT DETECTED NOT DETECTED Final   N gonorrhoeae NOT DETECTED NOT DETECTED Final    Comment: (NOTE) This CT/NG assay has not been evaluated in patients with a history of  hysterectomy. Performed at [facility], [address] [place], [place] [address]   Group A Strep by PCR     Status: None   Collection Time: 10/14/19 11:50 AM   Specimen: Throat; Sterile Swab  Result Value Ref Range Status   Group A Strep by PCR NOT DETECTED NOT DETECTED Final    Comment: Performed at [facility], [address] [place], [place] [address]    [x]  Patient discharged originally without antimicrobial agent and treatment is now indicated  New antibiotic prescription: Keflex 500mg  tid x 5 days - called in to [facility] [place] [facility] [place] [place] (Rx called in 10/17/19)  ED Provider: [facility], MD     10/19/19, PharmD, BCPS Clinical Pharmacist 10/18/2019 3:07 PM

## 2019-11-29 ENCOUNTER — Other Ambulatory Visit: Payer: Self-pay

## 2019-11-29 ENCOUNTER — Encounter: Payer: Self-pay | Admitting: *Deleted

## 2019-11-29 DIAGNOSIS — Z79899 Other long term (current) drug therapy: Secondary | ICD-10-CM | POA: Insufficient documentation

## 2019-11-29 DIAGNOSIS — Z20822 Contact with and (suspected) exposure to covid-19: Secondary | ICD-10-CM | POA: Insufficient documentation

## 2019-11-29 DIAGNOSIS — J40 Bronchitis, not specified as acute or chronic: Secondary | ICD-10-CM | POA: Insufficient documentation

## 2019-11-29 DIAGNOSIS — F17210 Nicotine dependence, cigarettes, uncomplicated: Secondary | ICD-10-CM | POA: Insufficient documentation

## 2019-11-29 DIAGNOSIS — I10 Essential (primary) hypertension: Secondary | ICD-10-CM | POA: Insufficient documentation

## 2019-11-29 NOTE — ED Triage Notes (Signed)
Productive cough (green mucous) for about 4 days. Denies fevers. Pt also says that she turned quick and fell out of her bed, now having mid/lower back pain.

## 2019-11-30 ENCOUNTER — Emergency Department
Admission: EM | Admit: 2019-11-30 | Discharge: 2019-11-30 | Disposition: A | Discharge status: Home / Self Care | Payer: Medicaid Other | Attending: Emergency Medicine | Admitting: Emergency Medicine

## 2019-11-30 ENCOUNTER — Emergency Department: Payer: Medicaid Other

## 2019-11-30 DIAGNOSIS — J40 Bronchitis, not specified as acute or chronic: Secondary | ICD-10-CM

## 2019-11-30 LAB — RESPIRATORY PANEL BY RT PCR (FLU A&B, COVID)
Influenza A by PCR: NEGATIVE
Influenza B by PCR: NEGATIVE
SARS Coronavirus 2 by RT PCR: NEGATIVE

## 2019-11-30 LAB — POCT PREGNANCY, URINE: Preg Test, Ur: NEGATIVE

## 2019-11-30 MED ORDER — BENZONATATE 100 MG PO CAPS: 100.0000 mg | ORAL_CAPSULE | Freq: Three times a day (TID) | ORAL | 0 refills | Status: DC | PRN

## 2019-11-30 MED ORDER — AZITHROMYCIN 500 MG PO TABS
500.0000 mg | ORAL_TABLET | Freq: Once | ORAL | Status: AC
  Administered 2019-11-30: 500 mg via ORAL
  Filled 2019-11-30: qty 1

## 2019-11-30 MED ORDER — AZITHROMYCIN 500 MG PO TABS: 500.0000 mg | ORAL_TABLET | Freq: Every day | ORAL | 0 refills | Status: AC

## 2019-11-30 MED ORDER — BENZONATATE 100 MG PO CAPS
100.0000 mg | ORAL_CAPSULE | Freq: Once | ORAL | Status: AC
  Administered 2019-11-30: 03:00:00 100 mg via ORAL
  Filled 2019-11-30: qty 1

## 2019-11-30 MED ORDER — AZITHROMYCIN 500 MG PO TABS: 500.0000 mg | ORAL_TABLET | Freq: Every day | ORAL | 0 refills | Status: DC

## 2019-11-30 MED ORDER — BENZONATATE 100 MG PO CAPS: 100.0000 mg | ORAL_CAPSULE | Freq: Three times a day (TID) | ORAL | 0 refills | Status: AC | PRN

## 2019-11-30 NOTE — ED Provider Notes (Signed)
[facility] Emergency Department Provider Note  ____________________________________________   First MD Initiated Contact with Patient 11/30/19 0134     (approximate)  I have reviewed the triage vital signs and the nursing notes.   HISTORY  Chief Complaint Cough    HPI Lori Henry is a 24 y.o. female with below list of previous medical conditions presents to the emergency department secondary to chronic cough for "months".   Patient states that her cough is becoming productive for the past 4 days with green mucus.  Patient denies any fever afebrile on presentation.  Denies any lower extremity pain or swelling.  Patient does admit to daily tobacco use        Past Medical History:  Diagnosis Date  . Anxiety   . Depression   . Hypertension   . Morbid obesity ([facility])   . Seizures ([facility])    Febrile seizures as a child and undiagnosed seizure on 01/2015    Patient Active Problem List   Diagnosis Date Noted  . Essential hypertension 08/15/2019  . PTSD (post-traumatic stress disorder) 08/15/2019  . MDD (major depressive disorder) 08/14/2019  . Panic attack     Past Surgical History:  Procedure Laterality Date  . CHOLECYSTECTOMY N/A 10/23/2016   Procedure: LAPAROSCOPIC CHOLECYSTECTOMY WITH INTRAOPERATIVE CHOLANGIOGRAM;  Surgeon: Nadeen Landau, MD;  Location: [facility] ORS;  Service: General;  Laterality: N/A;  . MOUTH BIOPSY     benign  . MOUTH SURGERY    . WISDOM TOOTH EXTRACTION      Prior to Admission medications   Medication Sig Start Date End Date Taking? Authorizing Provider  azithromycin (ZITHROMAX) 500 MG tablet Take 1 tablet (500 mg total) by mouth daily for 3 days. Take 1 tablet daily for 3 days. 11/30/19 12/03/19  Darci Current, MD  benzonatate (TESSALON PERLES) 100 MG capsule Take 1 capsule (100 mg total) by mouth 3 (three) times daily as needed. 11/30/19 11/29/20  Darci Current, MD  citalopram (CELEXA) 20 MG tablet Take 1  tablet (20 mg total) by mouth daily. 08/15/19   Clapacs, Jackquline Denmark, MD  lisinopril (ZESTRIL) 5 MG tablet Take 1 tablet (5 mg total) by mouth daily. 08/15/19   Clapacs, Jackquline Denmark, MD  predniSONE (STERAPRED UNI-PAK 21 TAB) 10 MG (21) TBPK tablet 6-day taper as directed. 10/14/19   Menshew, Charlesetta Ivory, PA-C    Allergies Bee venom  No family history on file.  Social History Social History   Tobacco Use  . Smoking status: Light Tobacco Smoker    Packs/day: 0.25    Types: Cigarettes  . Smokeless tobacco: Current User    Types: Chew  Substance Use Topics  . Alcohol use: No  . Drug use: No    Review of Systems Constitutional: No fever/chills Eyes: No visual changes. ENT: No sore throat. Cardiovascular: Denies chest pain. Respiratory: Denies shortness of breath.  Positive for cough Gastrointestinal: No abdominal pain.  No nausea, no vomiting.  No diarrhea.  No constipation. Genitourinary: Negative for dysuria. Musculoskeletal: Negative for neck pain.  Negative for back pain. Integumentary: Negative for rash. Neurological: Negative for headaches, focal weakness or numbness.  ____________________________________________   PHYSICAL EXAM:  VITAL SIGNS: ED Triage Vitals  Enc Vitals Group     BP 11/29/19 2209 (!) 149/106     Pulse Rate 11/29/19 2209 (!) 114     Resp 11/29/19 2209 20     Temp 11/29/19 2209 99.3 F (37.4 C)  Temp Source 11/29/19 2209 Oral     SpO2 11/29/19 2209 97 %     Weight 11/29/19 2207 108.9 kg (240 lb)     Height 11/29/19 2207 1.651 m (5\' 5" )     Head Circumference --      Peak Flow --      Pain Score 11/29/19 2208 8     Pain Loc --      Pain Edu? --      Excl. in GC? --     Constitutional: Alert and oriented.  Eyes: Conjunctivae are normal.  Mouth/Throat: Patient is wearing a mask. Neck: No stridor.  No meningeal signs.   Cardiovascular: Normal rate, regular rhythm. Good peripheral circulation. Grossly normal heart sounds. Respiratory: Normal  respiratory effort.  No retractions. Gastrointestinal: Soft and nontender. No distention.  Musculoskeletal: No lower extremity tenderness nor edema. No gross deformities of extremities. Neurologic:  Normal speech and language. No gross focal neurologic deficits are appreciated.  Skin:  Skin is warm, dry and intact. Psychiatric: Mood and affect are normal. Speech and behavior are normal.  ____________________________________________   LABS (all labs ordered are listed, but only abnormal results are displayed)  Labs Reviewed  RESPIRATORY PANEL BY RT PCR (FLU A&B, COVID)  POCT PREGNANCY, URINE     RADIOLOGY I, [place] Jayshon Dommer, personally viewed and evaluated these images (plain radiographs) as part of my medical decision making, as well as reviewing the written report by the radiologist.  ED MD interpretation: No radiographic evidence of acute cardiopulmonary disease on chest x-ray per radiologist.  No radiographic evidence of acute traumatic injury in the lumbar spine per radiologist.  Official radiology report(s): DG Chest 2 View  Result Date: 11/30/2019 CLINICAL DATA:  Initial evaluation for acute cough, fall. EXAM: CHEST - 2 VIEW COMPARISON:  Prior radiograph from 04/30/2019. FINDINGS: The cardiac and mediastinal silhouettes are stable in size and contour, and remain within normal limits. The lungs are normally inflated. No airspace consolidation, pleural effusion, or pulmonary edema. No pneumothorax. No acute osseous abnormality. IMPRESSION: No radiographic evidence for active cardiopulmonary disease. Electronically Signed   By: 05/02/2019 M.D.   On: 11/30/2019 01:21   DG Lumbar Spine Complete  Result Date: 11/30/2019 CLINICAL DATA:  Initial evaluation for acute pain status post fall. EXAM: LUMBAR SPINE - COMPLETE 4+ VIEW COMPARISON:  None. FINDINGS: Five non rib-bearing lumbar type vertebral bodies are present. Vertebral bodies normally aligned with preservation of the  normal lumbar lordosis. No listhesis. Vertebral body height maintained without evidence for acute or chronic fracture. Visualized sacrum and pelvis intact. Intervertebral disc space height maintained. No visible acute soft tissue injury. Cholecystectomy clips noted. Few scattered subcentimeter calcific densities overlie the right renal shadow, suggestive of nephrolithiasis. IMPRESSION: 1. No radiographic evidence for acute traumatic injury within the lumbar spine. 2. Probable right renal nephrolithiasis. Electronically Signed   By: 12/02/2019 M.D.   On: 11/30/2019 01:24      Procedures   ____________________________________________   INITIAL IMPRESSION / MDM / ASSESSMENT AND PLAN / ED COURSE  As part of my medical decision making, I reviewed the following data within the electronic MEDICAL RECORD NUMBER  24 year old female presented with above-stated history and physical exam a differential diagnosis including but not limited to chronic bronchitis, acute on chronic bronchitis, pneumonia, less likely COVID-19.  Patient given azithromycin 500 mg Tessalon Perles.  ____________________________________________  FINAL CLINICAL IMPRESSION(S) / ED DIAGNOSES  Final diagnoses:  Bronchitis     MEDICATIONS GIVEN DURING  THIS VISIT:  Medications  benzonatate (TESSALON) capsule 100 mg (100 mg Oral Given 11/30/19 0306)  azithromycin (ZITHROMAX) tablet 500 mg (500 mg Oral Given 11/30/19 0306)     ED Discharge Orders         Ordered    benzonatate (TESSALON PERLES) 100 MG capsule  3 times daily PRN,   Status:  Discontinued     11/30/19 0256    azithromycin (ZITHROMAX) 500 MG tablet  Daily,   Status:  Discontinued     11/30/19 0256    azithromycin (ZITHROMAX) 500 MG tablet  Daily     11/30/19 0307    benzonatate (TESSALON PERLES) 100 MG capsule  3 times daily PRN     11/30/19 3546          *Please note:  Lori Henry was evaluated in Emergency Department on 11/30/2019 for the  symptoms described in the history of present illness. She was evaluated in the context of the global COVID-19 pandemic, which necessitated consideration that the patient might be at risk for infection with the SARS-CoV-2 virus that causes COVID-19. Institutional protocols and algorithms that pertain to the evaluation of patients at risk for COVID-19 are in a state of rapid change based on information released by regulatory bodies including the CDC and federal and state organizations. These policies and algorithms were followed during the patient's care in the ED.  Some ED evaluations and interventions may be delayed as a result of limited staffing during the pandemic.*  Note:  This document was prepared using Dragon voice recognition software and may include unintentional dictation errors.   Gregor Hams, MD 11/30/19 [phone]

## 2019-11-30 NOTE — ED Notes (Signed)
Pt reports head pain in between her eyebrows when coughing. Pt reports the cough has been persistent for the past month and she was seen for it earlier in the year but the medications she was prescribed have not helped and it is now a productive cough.

## 2020-08-21 DIAGNOSIS — E282 Polycystic ovarian syndrome: Secondary | ICD-10-CM | POA: Insufficient documentation

## 2021-05-29 ENCOUNTER — Emergency Department
Admission: EM | Admit: 2021-05-29 | Discharge: 2021-05-29 | Disposition: A | Discharge status: Home / Self Care | Payer: Medicaid Other | Attending: Emergency Medicine | Admitting: Emergency Medicine

## 2021-05-29 ENCOUNTER — Encounter: Payer: Self-pay | Admitting: Emergency Medicine

## 2021-05-29 ENCOUNTER — Other Ambulatory Visit: Payer: Self-pay

## 2021-05-29 DIAGNOSIS — J029 Acute pharyngitis, unspecified: Secondary | ICD-10-CM | POA: Diagnosis present

## 2021-05-29 DIAGNOSIS — Z20822 Contact with and (suspected) exposure to covid-19: Secondary | ICD-10-CM | POA: Insufficient documentation

## 2021-05-29 DIAGNOSIS — F17210 Nicotine dependence, cigarettes, uncomplicated: Secondary | ICD-10-CM | POA: Diagnosis not present

## 2021-05-29 DIAGNOSIS — Z79899 Other long term (current) drug therapy: Secondary | ICD-10-CM | POA: Insufficient documentation

## 2021-05-29 DIAGNOSIS — I10 Essential (primary) hypertension: Secondary | ICD-10-CM | POA: Diagnosis not present

## 2021-05-29 DIAGNOSIS — H9203 Otalgia, bilateral: Secondary | ICD-10-CM | POA: Diagnosis not present

## 2021-05-29 DIAGNOSIS — J069 Acute upper respiratory infection, unspecified: Secondary | ICD-10-CM | POA: Insufficient documentation

## 2021-05-29 LAB — RESP PANEL BY RT-PCR (FLU A&B, COVID) ARPGX2
Influenza A by PCR: NEGATIVE
Influenza B by PCR: NEGATIVE
SARS Coronavirus 2 by RT PCR: NEGATIVE

## 2021-05-29 LAB — GROUP A STREP BY PCR: Group A Strep by PCR: NOT DETECTED

## 2021-05-29 MED ORDER — AMOXICILLIN 875 MG PO TABS: 875.0000 mg | ORAL_TABLET | Freq: Two times a day (BID) | ORAL | 0 refills | Status: DC

## 2021-05-29 MED ORDER — PREDNISONE 10 MG PO TABS: 30.0000 mg | ORAL_TABLET | Freq: Every day | ORAL | 0 refills | Status: DC

## 2021-05-29 NOTE — ED Notes (Signed)
Pt believes that she may also have a UTI.

## 2021-05-29 NOTE — Discharge Instructions (Signed)
With your regular doctor if not improving to 3 days.  Return emergency department worsening

## 2021-05-29 NOTE — ED Triage Notes (Signed)
Pt reports sore throat for 2 days and ear pain.

## 2021-05-29 NOTE — ED Notes (Signed)
Pt signed physical discharge form and placed in med rec box. Pt ambulated to lobby with no issue.

## 2021-05-29 NOTE — ED Provider Notes (Signed)
[facility] Emergency Department Provider Note  ____________________________________________   Event Date/Time   First MD Initiated Contact with Patient 05/29/21 1231     (approximate)  I have reviewed the triage vital signs and the nursing notes.   HISTORY  Chief Complaint Ear Pain and Sore Throat    HPI Lori Henry is a 25 y.o. female presents emergency department complaint bilateral ear pain, sore throat, and her urine has a strong odor.  No fever or chills.  No abdominal pain.  No nausea or vomiting.  No chest pain or shortness of breath.  Past Medical History:  Diagnosis Date   Anxiety    Depression    Hypertension    Morbid obesity (HCC)    Seizures (HCC)    Febrile seizures as a child and undiagnosed seizure on 01/2015    Patient Active Problem List   Diagnosis Date Noted   Essential hypertension 08/15/2019   PTSD (post-traumatic stress disorder) 08/15/2019   MDD (major depressive disorder) 08/14/2019   Panic attack     Past Surgical History:  Procedure Laterality Date   CHOLECYSTECTOMY N/A 10/23/2016   Procedure: LAPAROSCOPIC CHOLECYSTECTOMY WITH INTRAOPERATIVE CHOLANGIOGRAM;  Surgeon: Nadeen Landau, MD;  Location: [facility] ORS;  Service: General;  Laterality: N/A;   MOUTH BIOPSY     benign   MOUTH SURGERY     WISDOM TOOTH EXTRACTION      Prior to Admission medications   Medication Sig Start Date End Date Taking? Authorizing Provider  predniSONE (DELTASONE) 10 MG tablet Take 3 tablets (30 mg total) by mouth daily with breakfast. 05/29/21  Yes Capucine Tryon, Roselyn Bering, PA-C  amoxicillin (AMOXIL) 875 MG tablet Take 1 tablet (875 mg total) by mouth 2 (two) times daily. 05/29/21   Zennie Ayars, Roselyn Bering, PA-C  citalopram (CELEXA) 20 MG tablet Take 1 tablet (20 mg total) by mouth daily. 08/15/19   Clapacs, Jackquline Denmark, MD  lisinopril (ZESTRIL) 5 MG tablet Take 1 tablet (5 mg total) by mouth daily. 08/15/19   Clapacs, Jackquline Denmark, MD    Allergies Bee  venom  No family history on file.  Social History Social History   Tobacco Use   Smoking status: Light Smoker    Packs/day: 0.25    Types: Cigarettes   Smokeless tobacco: Current    Types: Chew  Vaping Use   Vaping Use: Never used  Substance Use Topics   Alcohol use: No   Drug use: No    Review of Systems  Constitutional: No fever/chills Eyes: No visual changes. ENT: Positive ear pain and sore throat. Respiratory: Denies cough Cardiovascular: Denies chest pain Gastrointestinal: Denies abdominal pain Genitourinary: Negative for dysuria. Musculoskeletal: Negative for back pain. Skin: Negative for rash. Psychiatric: no mood changes,     ____________________________________________   PHYSICAL EXAM:  VITAL SIGNS: ED Triage Vitals  Enc Vitals Group     BP 05/29/21 1143 (!) 136/95     Pulse Rate 05/29/21 1143 85     Resp 05/29/21 1143 16     Temp 05/29/21 1143 98.6 F (37 C)     Temp Source 05/29/21 1143 Oral     SpO2 05/29/21 1143 100 %     Weight 05/29/21 1120 273 lb (123.8 kg)     Height 05/29/21 1120 5\' 3"  (1.6 m)     Head Circumference --      Peak Flow --      Pain Score 05/29/21 1120 10     Pain Loc --  Pain Edu? --      Excl. in GC? --     Constitutional: Alert and oriented. Well appearing and in no acute distress. Eyes: Conjunctivae are normal.  Head: Atraumatic. Ears: TMs are dull bilaterally, pain, right Nose: No congestion/rhinnorhea. Mouth/Throat: Mucous membranes are moist.  Throat appears normal  neck:  supple no lymphadenopathy noted Cardiovascular: Normal rate, regular rhythm. Heart sounds are normal Respiratory: Normal respiratory effort.  No retractions, lungs c t a  GU: deferred Musculoskeletal: FROM all extremities, warm and well perfused Neurologic:  Normal speech and language.  Skin:  Skin is warm, dry and intact. No rash noted. Psychiatric: Mood and affect are normal. Speech and behavior are  normal.  ____________________________________________   LABS (all labs ordered are listed, but only abnormal results are displayed)  Labs Reviewed  GROUP A STREP BY PCR  RESP PANEL BY RT-PCR (FLU A&B, COVID) ARPGX2   ____________________________________________   ____________________________________________  RADIOLOGY    ____________________________________________   PROCEDURES  Procedure(s) performed: No  Procedures    ____________________________________________   INITIAL IMPRESSION / ASSESSMENT AND PLAN / ED COURSE  Pertinent labs & imaging results that were available during my care of the patient were reviewed by me and considered in my medical decision making (see chart for details).   The patient is a 25 year old female presents emergency department with URI symptoms.  See HPI.  Physical exam shows patient be stable.  Respiratory panel and strep test are both negative  Did explain findings to the patient.  Due to the ear infection I started amoxicillin and 30 mg prednisone for 3 days.  Follow-up with her regular doctor if not improving to 3 days.  Return emergency department worsening.  She is discharged stable condition.     Lori Henry was evaluated in Emergency Department on 05/29/2021 for the symptoms described in the history of present illness. She was evaluated in the context of the global COVID-19 pandemic, which necessitated consideration that the patient might be at risk for infection with the SARS-CoV-2 virus that causes COVID-19. Institutional protocols and algorithms that pertain to the evaluation of patients at risk for COVID-19 are in a state of rapid change based on information released by regulatory bodies including the CDC and federal and state organizations. These policies and algorithms were followed during the patient's care in the ED.    As part of my medical decision making, I reviewed the following data within the electronic  MEDICAL RECORD NUMBER Nursing notes reviewed and incorporated, Labs reviewed , Old chart reviewed, Notes from prior ED visits, and [place] Controlled Substance Database  ____________________________________________   FINAL CLINICAL IMPRESSION(S) / ED DIAGNOSES  Final diagnoses:  Acute URI      NEW MEDICATIONS STARTED DURING THIS VISIT:  New Prescriptions   AMOXICILLIN (AMOXIL) 875 MG TABLET    Take 1 tablet (875 mg total) by mouth 2 (two) times daily.   PREDNISONE (DELTASONE) 10 MG TABLET    Take 3 tablets (30 mg total) by mouth daily with breakfast.     Note:  This document was prepared using Dragon voice recognition software and may include unintentional dictation errors.    Faythe Ghee, PA-C 05/29/21 1313    Sharman Cheek, MD 06/03/21 [phone]

## 2021-06-10 ENCOUNTER — Ambulatory Visit (INDEPENDENT_AMBULATORY_CARE_PROVIDER_SITE_OTHER): Payer: Medicaid Other | Admitting: Obstetrics and Gynecology

## 2021-06-10 ENCOUNTER — Encounter: Payer: Self-pay | Admitting: Obstetrics and Gynecology

## 2021-06-10 ENCOUNTER — Other Ambulatory Visit: Payer: Self-pay

## 2021-06-10 VITALS — BP 138/108 | HR 97 | Wt 277.9 lb

## 2021-06-10 DIAGNOSIS — Z7689 Persons encountering health services in other specified circumstances: Secondary | ICD-10-CM

## 2021-06-10 DIAGNOSIS — Z6841 Body Mass Index (BMI) 40.0 and over, adult: Secondary | ICD-10-CM

## 2021-06-10 NOTE — Progress Notes (Signed)
HPI:      [name] is a 25 y.o. G1P0 who LMP was No LMP recorded. Patient is pregnant.  Subjective:   She presents today to establish care for obstetrics.  She is approximately 10 weeks estimated gestational age based on a 5-week ultrasound.  No fetal pole was noted at that ultrasound.  She was not attempting pregnancy and in fact did not have menstrual period for 8 years prior.  She says that she is amazed that she even became pregnant.  She is currently taking prenatal vitamins. She has no complaints.  She is not experiencing daily nausea and vomiting.    Hx: The following portions of the patient's history were reviewed and updated as appropriate:             She  has a past medical history of Anxiety, Depression, Hypertension, Morbid obesity (HCC), and Seizures (HCC). She does not have any pertinent problems on file. She  has a past surgical history that includes Wisdom tooth extraction; Mouth surgery; Mouth Biopsy; and Cholecystectomy (N/A, 10/23/2016). Her family history includes Diabetes in her mother; Healthy in her father; Hyperlipidemia in her mother; Hypertension in her mother. She  reports that she has quit smoking. Her smoking use included cigarettes. She smoked an average of .25 packs per day. She has quit using smokeless tobacco.  Her smokeless tobacco use included chew. She reports that she does not drink alcohol and does not use drugs. She has a current medication list which includes the following prescription(s): prenatal multivitamin. She is allergic to bee venom.       Review of Systems:  Review of Systems  Constitutional: Denied constitutional symptoms, night sweats, recent illness, fatigue, fever, insomnia and weight loss.  Eyes: Denied eye symptoms, eye pain, photophobia, vision change and visual disturbance.  Ears/Nose/Throat/Neck: Denied ear, nose, throat or neck symptoms, hearing loss, nasal discharge, sinus congestion and sore throat.  Cardiovascular:  Denied cardiovascular symptoms, arrhythmia, chest pain/pressure, edema, exercise intolerance, orthopnea and palpitations.  Respiratory: Denied pulmonary symptoms, asthma, pleuritic pain, productive sputum, cough, dyspnea and wheezing.  Gastrointestinal: Denied, gastro-esophageal reflux, melena, nausea and vomiting.  Genitourinary: Denied genitourinary symptoms including symptomatic vaginal discharge, pelvic relaxation issues, and urinary complaints.  Musculoskeletal: Denied musculoskeletal symptoms, stiffness, swelling, muscle weakness and myalgia.  Dermatologic: Denied dermatology symptoms, rash and scar.  Neurologic: Denied neurology symptoms, dizziness, headache, neck pain and syncope.  Psychiatric: Denied psychiatric symptoms, anxiety and depression.  Endocrine: Denied endocrine symptoms including hot flashes and night sweats.   Meds:   Current Outpatient Medications on File Prior to Visit  Medication Sig Dispense Refill   Prenatal Vit-Fe Fumarate-FA (PRENATAL MULTIVITAMIN) TABS tablet Take 1 tablet by mouth daily at 12 noon.     No current facility-administered medications on file prior to visit.      Objective:     There were no vitals filed for this visit. There were no vitals filed for this visit.                      Assessment:    G1P0 Patient Active Problem List   Diagnosis Date Noted   Essential hypertension 08/15/2019   PTSD (post-traumatic stress disorder) 08/15/2019   MDD (major depressive disorder) 08/14/2019   Panic attack      1. Encounter to establish care   2. Morbid obesity with BMI of 45.0-49.9, adult (HCC)     Approximately 10 weeks estimated gestational age based on gestational sac  noted at ultrasound.   Plan:            Prenatal Plan 1.  The patient was given prenatal literature. 2.  She was continued on prenatal vitamins. 3.  A prenatal lab panel to be drawn at nurse visit. 4.  An ultrasound was ordered to better determine an EDC and to  establish crown-rump length with fetal heart rate. 5.  A nurse visit was scheduled. 6.  Genetic testing and testing for other inheritable conditions discussed in detail. She will decide in the future whether to have these labs performed. 7.  A general overview of pregnancy testing, visit schedule, ultrasound schedule, and prenatal care was discussed. 8.  COVID and its risks associated with pregnancy, prevention by limiting exposure and use of masks, as well as the risks and benefits of vaccination during pregnancy were discussed in detail.  [facility] policy regarding office and hospital visitation and testing was explained. 9.  Benefits of breast-feeding discussed in detail including both maternal and infant benefits. Ready Set Baby website discussed. 10.  Anesthesia consult ordered for elevated BMI 11.  Begin ASA-81 mg at 13 weeks 12.  Consider early 1 hour GCT for elevated BMI.  Orders No orders of the defined types were placed in this encounter.   No orders of the defined types were placed in this encounter.     F/U  Return in about 5 weeks (around 07/15/2021). I spent 33 minutes involved in the care of this patient preparing to see the patient by obtaining and reviewing her medical history (including labs, imaging tests and prior procedures), documenting clinical information in the electronic health record (EHR), counseling and coordinating care plans, writing and sending prescriptions, ordering tests or procedures and in direct communicating with the patient and medical staff discussing pertinent items from her history and physical exam.  Finis Bud, M.D. 06/10/2021 2:05 PM

## 2021-06-10 NOTE — Progress Notes (Signed)
Pt is present today for confirmation of pregnancy. Pregnancy confirmed in [place]. Pt LMP unknown due to she haven't had a cycle in years. Pt stated that she is doing well no complaints.

## 2021-06-24 ENCOUNTER — Other Ambulatory Visit: Payer: Self-pay

## 2021-06-24 ENCOUNTER — Other Ambulatory Visit: Payer: Self-pay | Admitting: Obstetrics and Gynecology

## 2021-06-24 ENCOUNTER — Ambulatory Visit (INDEPENDENT_AMBULATORY_CARE_PROVIDER_SITE_OTHER): Payer: Medicaid Other

## 2021-06-24 DIAGNOSIS — O3680X0 Pregnancy with inconclusive fetal viability, not applicable or unspecified: Secondary | ICD-10-CM

## 2021-06-27 ENCOUNTER — Other Ambulatory Visit: Payer: Self-pay

## 2021-06-27 ENCOUNTER — Emergency Department: Payer: Medicaid Other

## 2021-06-27 ENCOUNTER — Emergency Department
Admission: EM | Admit: 2021-06-27 | Discharge: 2021-06-27 | Disposition: A | Discharge status: Home / Self Care | Payer: Medicaid Other | Attending: Emergency Medicine | Admitting: Emergency Medicine

## 2021-06-27 DIAGNOSIS — Z87891 Personal history of nicotine dependence: Secondary | ICD-10-CM | POA: Diagnosis not present

## 2021-06-27 DIAGNOSIS — Z3A13 13 weeks gestation of pregnancy: Secondary | ICD-10-CM | POA: Diagnosis not present

## 2021-06-27 DIAGNOSIS — Z20822 Contact with and (suspected) exposure to covid-19: Secondary | ICD-10-CM | POA: Insufficient documentation

## 2021-06-27 DIAGNOSIS — J101 Influenza due to other identified influenza virus with other respiratory manifestations: Secondary | ICD-10-CM | POA: Diagnosis not present

## 2021-06-27 DIAGNOSIS — O4690 Antepartum hemorrhage, unspecified, unspecified trimester: Secondary | ICD-10-CM

## 2021-06-27 DIAGNOSIS — O26851 Spotting complicating pregnancy, first trimester: Secondary | ICD-10-CM | POA: Diagnosis not present

## 2021-06-27 DIAGNOSIS — O99511 Diseases of the respiratory system complicating pregnancy, first trimester: Secondary | ICD-10-CM | POA: Diagnosis not present

## 2021-06-27 DIAGNOSIS — O10011 Pre-existing essential hypertension complicating pregnancy, first trimester: Secondary | ICD-10-CM | POA: Diagnosis not present

## 2021-06-27 LAB — CBC WITH DIFFERENTIAL/PLATELET
Abs Immature Granulocytes: 0.05 10*3/uL (ref 0.00–0.07)
Basophils Absolute: 0 10*3/uL (ref 0.0–0.1)
Basophils Relative: 0 %
Eosinophils Absolute: 0 10*3/uL (ref 0.0–0.5)
Eosinophils Relative: 0 %
HCT: 36.3 % (ref 36.0–46.0)
Hemoglobin: 12.3 g/dL (ref 12.0–15.0)
Immature Granulocytes: 1 %
Lymphocytes Relative: 7 %
Lymphs Abs: 0.6 10*3/uL — ABNORMAL LOW (ref 0.7–4.0)
MCH: 27.6 pg (ref 26.0–34.0)
MCHC: 33.9 g/dL (ref 30.0–36.0)
MCV: 81.6 fL (ref 80.0–100.0)
Monocytes Absolute: 1.1 10*3/uL — ABNORMAL HIGH (ref 0.1–1.0)
Monocytes Relative: 12 %
Neutro Abs: 7 10*3/uL (ref 1.7–7.7)
Neutrophils Relative %: 80 %
Platelets: 269 10*3/uL (ref 150–400)
RBC: 4.45 MIL/uL (ref 3.87–5.11)
RDW: 14.2 % (ref 11.5–15.5)
WBC: 8.7 10*3/uL (ref 4.0–10.5)
nRBC: 0 % (ref 0.0–0.2)

## 2021-06-27 LAB — COMPREHENSIVE METABOLIC PANEL
ALT: 57 U/L — ABNORMAL HIGH (ref 0–44)
AST: 40 U/L (ref 15–41)
Albumin: 3.7 g/dL (ref 3.5–5.0)
Alkaline Phosphatase: 69 U/L (ref 38–126)
Anion gap: 8 (ref 5–15)
BUN: 8 mg/dL (ref 6–20)
CO2: 20 mmol/L — ABNORMAL LOW (ref 22–32)
Calcium: 8.9 mg/dL (ref 8.9–10.3)
Chloride: 103 mmol/L (ref 98–111)
Creatinine, Ser: 0.48 mg/dL (ref 0.44–1.00)
GFR, Estimated: >60 mL/min (ref >60)
Glucose, Bld: 106 mg/dL — ABNORMAL HIGH (ref 70–99)
Potassium: 3.4 mmol/L — ABNORMAL LOW (ref 3.5–5.1)
Sodium: 131 mmol/L — ABNORMAL LOW (ref 135–145)
Total Bilirubin: 0.2 mg/dL — ABNORMAL LOW (ref 0.3–1.2)
Total Protein: 7.8 g/dL (ref 6.5–8.1)

## 2021-06-27 LAB — POC URINE PREG, ED: Preg Test, Ur: POSITIVE — AB

## 2021-06-27 LAB — MAGNESIUM: Magnesium: 1.8 mg/dL (ref 1.7–2.4)

## 2021-06-27 LAB — RESP PANEL BY RT-PCR (FLU A&B, COVID) ARPGX2
Influenza A by PCR: POSITIVE — AB
Influenza B by PCR: NEGATIVE
SARS Coronavirus 2 by RT PCR: NEGATIVE

## 2021-06-27 LAB — HCG, QUANTITATIVE, PREGNANCY: hCG, Beta Chain, Quant, S: 64038 m[IU]/mL — ABNORMAL HIGH (ref <5)

## 2021-06-27 LAB — ABO/RH: ABO/RH(D): A POS

## 2021-06-27 MED ORDER — ONDANSETRON 4 MG PO TBDP: 4.0000 mg | ORAL_TABLET | Freq: Three times a day (TID) | ORAL | 0 refills | Status: DC | PRN

## 2021-06-27 MED ORDER — OSELTAMIVIR PHOSPHATE 75 MG PO CAPS: 75.0000 mg | ORAL_CAPSULE | Freq: Two times a day (BID) | ORAL | 0 refills | Status: DC

## 2021-06-27 NOTE — ED Provider Notes (Signed)
[facility] Emergency Department Provider Note  ____________________________________________   Event Date/Time   First MD Initiated Contact with Patient 06/27/21 [phone]     (approximate)  I have reviewed the triage vital signs and the nursing notes.   HISTORY  Chief Complaint Vaginal Bleeding (Pt c/o vaginal bleeding that started about 3:30, cough and fever. Pt reports she is [redacted] weeks pregnant. )    HPI Lori Henry is a 25 y.o. female presents emergency department complaint vaginal bleeding in pregnancy.  Also has had a cough and fever.  Is [redacted] weeks pregnant.  Denies chest pain or shortness of breath.  No vomiting or diarrhea.  Past Medical History:  Diagnosis Date   Anxiety    Depression    Hypertension    Morbid obesity (HCC)    Seizures (HCC)    Febrile seizures as a child and undiagnosed seizure on 01/2015    Patient Active Problem List   Diagnosis Date Noted   Essential hypertension 08/15/2019   PTSD (post-traumatic stress disorder) 08/15/2019   MDD (major depressive disorder) 08/14/2019   Panic attack     Past Surgical History:  Procedure Laterality Date   CHOLECYSTECTOMY N/A 10/23/2016   Procedure: LAPAROSCOPIC CHOLECYSTECTOMY WITH INTRAOPERATIVE CHOLANGIOGRAM;  Surgeon: Nadeen Landau, MD;  Location: [facility] ORS;  Service: General;  Laterality: N/A;   MOUTH BIOPSY     benign   MOUTH SURGERY     WISDOM TOOTH EXTRACTION      Prior to Admission medications   Medication Sig Start Date End Date Taking? Authorizing Provider  ondansetron (ZOFRAN-ODT) 4 MG disintegrating tablet Take 1 tablet (4 mg total) by mouth every 8 (eight) hours as needed. 06/27/21  Yes Marcey Persad, Roselyn Bering, PA-C  oseltamivir (TAMIFLU) 75 MG capsule Take 1 capsule (75 mg total) by mouth 2 (two) times daily. 06/27/21   Faythe Ghee, PA-C  Prenatal Vit-Fe Fumarate-FA (PRENATAL MULTIVITAMIN) TABS tablet Take 1 tablet by mouth daily at 12 noon.    [provider]    Allergies Bee venom  Family History  Problem Relation Age of Onset   Hyperlipidemia Mother    Diabetes Mother    Hypertension Mother    Healthy Father     Social History Social History   Tobacco Use   Smoking status: Former    Packs/day: 0.25    Types: Cigarettes   Smokeless tobacco: Former    Types: Associate Professor Use: Never used  Substance Use Topics   Alcohol use: No   Drug use: No    Review of Systems  Constitutional: Positive fever/chills Eyes: No visual changes. ENT: No sore throat. Respiratory: Positive cough Cardiovascular: Denies chest pain Gastrointestinal: Denies abdominal pain Genitourinary: Negative for dysuria. Musculoskeletal: Negative for back pain. Skin: Negative for rash. Psychiatric: no mood changes,     ____________________________________________   PHYSICAL EXAM:  VITAL SIGNS: ED Triage Vitals  Enc Vitals Group     BP 06/27/21 0414 (!) 140/98     Pulse Rate 06/27/21 0414 (!) 132     Resp 06/27/21 0414 20     Temp 06/27/21 0414 99.7 F (37.6 C)     Temp Source 06/27/21 0414 Oral     SpO2 06/27/21 0414 100 %     Weight 06/27/21 0421 273 lb (123.8 kg)     Height 06/27/21 0421 5\' 5"  (1.651 m)     Head Circumference --      Peak Flow --  Pain Score 06/27/21 0421 10     Pain Loc --      Pain Edu? --      Excl. in GC? --     Constitutional: Alert and oriented. Well appearing and in no acute distress. Eyes: Conjunctivae are normal.  Head: Atraumatic. Ears: TMs are clear bilaterally Nose: No congestion/rhinnorhea. Mouth/Throat: Mucous membranes are moist.   Neck:  supple no lymphadenopathy noted Cardiovascular: Normal rate, regular rhythm. Heart sounds are normal Respiratory: Normal respiratory effort.  No retractions, lungs c t a  Abd: soft nontender bs normal all 4 quad GU: deferred Musculoskeletal: FROM all extremities, warm and well perfused Neurologic:  Normal speech and language.   Skin:  Skin is warm, dry and intact. No rash noted. Psychiatric: Mood and affect are normal. Speech and behavior are normal.  ____________________________________________   LABS (all labs ordered are listed, but only abnormal results are displayed)  Labs Reviewed  RESP PANEL BY RT-PCR (FLU A&B, COVID) ARPGX2 - Abnormal; Notable for the following components:      Result Value   Influenza A by PCR POSITIVE (*)    All other components within normal limits  CBC WITH DIFFERENTIAL/PLATELET - Abnormal; Notable for the following components:   Lymphs Abs 0.6 (*)    Monocytes Absolute 1.1 (*)    All other components within normal limits  COMPREHENSIVE METABOLIC PANEL - Abnormal; Notable for the following components:   Sodium 131 (*)    Potassium 3.4 (*)    CO2 20 (*)    Glucose, Bld 106 (*)    ALT 57 (*)    Total Bilirubin 0.2 (*)    All other components within normal limits  HCG, QUANTITATIVE, PREGNANCY - Abnormal; Notable for the following components:   hCG, Beta Chain, Quant, S 64,038 (*)    All other components within normal limits  POC URINE PREG, ED - Abnormal; Notable for the following components:   Preg Test, Ur POSITIVE (*)    All other components within normal limits  MAGNESIUM  ABO/RH   ____________________________________________   ____________________________________________  RADIOLOGY  Ultrasound OB less than 14-week  ____________________________________________   PROCEDURES  Procedure(s) performed: No  Procedures    ____________________________________________   INITIAL IMPRESSION / ASSESSMENT AND PLAN / ED COURSE  Pertinent labs & imaging results that were available during my care of the patient were reviewed by me and considered in my medical decision making (see chart for details).   The patient is a 25 year old female presents emergency department with complaints of vaginal bleeding in pregnancy along with flulike symptoms.  See HPI.  Physical  exam shows patient be stable at this time.  DDx: Subchorionic hemorrhage, threatened miscarriage, miscarriage, influenza, pneumonia  Labs are reassuring, CBC, metabolic panel are within a normal range, ABO/Rh is a positive, beta-hCG is 64,038, magnesium is normal, POC pregnancy was positive, respiratory panel was positive for influenza A  Ultrasound reviewed by me confirmed by radiology shows a IUP of 13 weeks and 2 days, no subchorionic hemorrhage noted  I did discuss findings with patient.  Due to her being pregnant and positive for influenza we need to take extra precautions.  I did explain strict precautions with her to return if worsening.  Follow-up with her regular doctor for close follow-up.  She was given prescription for Tamiflu and Zofran.  She is to drink plenty of fluids.  Return emergency department worsening.  She is in agreement treatment plan.  Discharged in stable condition.  ANTONEA GAUT was evaluated in Emergency Department on 06/27/2021 for the symptoms described in the history of present illness. She was evaluated in the context of the global COVID-19 pandemic, which necessitated consideration that the patient might be at risk for infection with the SARS-CoV-2 virus that causes COVID-19. Institutional protocols and algorithms that pertain to the evaluation of patients at risk for COVID-19 are in a state of rapid change based on information released by regulatory bodies including the CDC and federal and state organizations. These policies and algorithms were followed during the patient's care in the ED.    As part of my medical decision making, I reviewed the following data within the electronic MEDICAL RECORD NUMBER History obtained from family, Nursing notes reviewed and incorporated, Labs reviewed , Old chart reviewed, Radiograph reviewed , Notes from prior ED visits, and [place] Controlled Substance Database  ____________________________________________   FINAL CLINICAL  IMPRESSION(S) / ED DIAGNOSES  Final diagnoses:  Influenza A  Vaginal bleeding in pregnancy      NEW MEDICATIONS STARTED DURING THIS VISIT:  New Prescriptions   ONDANSETRON (ZOFRAN-ODT) 4 MG DISINTEGRATING TABLET    Take 1 tablet (4 mg total) by mouth every 8 (eight) hours as needed.   OSELTAMIVIR (TAMIFLU) 75 MG CAPSULE    Take 1 capsule (75 mg total) by mouth 2 (two) times daily.     Note:  This document was prepared using Dragon voice recognition software and may include unintentional dictation errors.    Faythe Ghee, PA-C 06/27/21 6045    Minna Antis, MD 06/27/21 1450

## 2021-06-27 NOTE — Discharge Instructions (Signed)
Follow-up with your regular doctor if not improving in 3 days.  Return emergency department worsening.  Take medications as prescribed. 

## 2021-06-27 NOTE — ED Triage Notes (Signed)
Pt c/o vaginal bleeding that started about 3:30, cough and fever. Pt reports she is [redacted] weeks pregnant.

## 2021-06-30 ENCOUNTER — Encounter: Payer: Medicaid Other | Admitting: Obstetrics and Gynecology

## 2021-06-30 NOTE — Progress Notes (Deleted)
Lori Henry presents for NOB nurse interview visit.  Pregnancy confirmed in [place]. Pt LMP unknown due to she haven't had a cycle in years.  G1P0000. Pregnancy education material explained and given. ___ cats in the home. NOB labs ordered. TSH/HbgA1c due to Increased BMI. There is no height or weight on file to calculate BMI.  HIV labs and Drug screen were explained optional and she did not decline. Drug screen ordered/declined. PNV encouraged. Genetic screening options discussed. Genetic testing: Ordered/Declined/Unsure.  Pt may discuss with provider.  Financial policy reviewed. FMLA from reviewed and signed. Pt. To follow up with [facility] in 2 weeks for NOB physical.  All questions answered.

## 2021-07-03 ENCOUNTER — Encounter: Payer: Self-pay | Admitting: Obstetrics and Gynecology

## 2021-07-16 ENCOUNTER — Other Ambulatory Visit: Payer: Self-pay

## 2021-07-16 ENCOUNTER — Ambulatory Visit (INDEPENDENT_AMBULATORY_CARE_PROVIDER_SITE_OTHER): Payer: Medicaid Other | Admitting: Obstetrics and Gynecology

## 2021-07-16 ENCOUNTER — Telehealth: Payer: Self-pay | Admitting: Obstetrics and Gynecology

## 2021-07-16 ENCOUNTER — Encounter: Payer: Self-pay | Admitting: Obstetrics and Gynecology

## 2021-07-16 ENCOUNTER — Other Ambulatory Visit (HOSPITAL_COMMUNITY)
Admission: RE | Admit: 2021-07-16 | Discharge: 2021-07-16 | Disposition: A | Discharge status: Home / Self Care | Payer: Medicaid Other | Source: Ambulatory Visit | Attending: Obstetrics and Gynecology | Admitting: Obstetrics and Gynecology

## 2021-07-16 VITALS — BP 121/87 | HR 102 | Wt 277.0 lb

## 2021-07-16 DIAGNOSIS — Z23 Encounter for immunization: Secondary | ICD-10-CM | POA: Diagnosis not present

## 2021-07-16 DIAGNOSIS — Z1379 Encounter for other screening for genetic and chromosomal anomalies: Secondary | ICD-10-CM | POA: Diagnosis not present

## 2021-07-16 DIAGNOSIS — O99212 Obesity complicating pregnancy, second trimester: Secondary | ICD-10-CM

## 2021-07-16 DIAGNOSIS — Z6841 Body Mass Index (BMI) 40.0 and over, adult: Secondary | ICD-10-CM

## 2021-07-16 DIAGNOSIS — Z124 Encounter for screening for malignant neoplasm of cervix: Secondary | ICD-10-CM | POA: Insufficient documentation

## 2021-07-16 DIAGNOSIS — Z3402 Encounter for supervision of normal first pregnancy, second trimester: Secondary | ICD-10-CM

## 2021-07-16 DIAGNOSIS — Z3A16 16 weeks gestation of pregnancy: Secondary | ICD-10-CM

## 2021-07-16 LAB — POCT URINALYSIS DIPSTICK OB
Bilirubin, UA: NEGATIVE
Blood, UA: NEGATIVE
Glucose, UA: NEGATIVE
Ketones, UA: NEGATIVE
Leukocytes, UA: NEGATIVE
Nitrite, UA: NEGATIVE
Odor: NORMAL
POC,PROTEIN,UA: NEGATIVE
Spec Grav, UA: 1.02 (ref 1.010–1.025)
Urobilinogen, UA: 0.2 E.U./dL
pH, UA: 6 (ref 5.0–8.0)

## 2021-07-16 NOTE — Progress Notes (Signed)
NOB: Patient missed her last appointment because of "flu".  She did come for ultrasound showing crown-rump length at 13 weeks.  Has not yet done anesthesia consult- consult reentered.  1 hour GCT in 2 weeks (increased BMI).  Patient taking prenatal vitamins and aspirin.  Shot given today.  Patient desires MaterniT 21 and AFP today.  Anatomy ultrasound ordered for 20 weeks.  Patient has a rash on her feet we will follow.  1% hydrocortisone cream for itching.  Physical examination General NAD, Conversant  HEENT Atraumatic; Op clear with mmm.  Normo-cephalic. Pupils reactive. Anicteric sclerae  Thyroid/Neck Smooth without nodularity or enlargement. Normal ROM.  Neck Supple.  Skin No rashes, lesions or ulceration. Normal palpated skin turgor. No nodularity.  Breasts: No masses or discharge.  Symmetric.  No axillary adenopathy.  Lungs: Clear to auscultation.No rales or wheezes. Normal Respiratory effort, no retractions.  Heart: NSR.  No murmurs or rubs appreciated. No periferal edema  Abdomen: Soft.  Non-tender.  No masses.  No HSM. No hernia  Extremities: Moves all appropriately.  Normal ROM for age. No lymphadenopathy.  Neuro: Oriented to PPT.  Normal mood. Normal affect.     Pelvic:   Vulva: Normal appearance.  No lesions.  Vagina: No lesions or abnormalities noted.  Support: Normal pelvic support.  Urethra No masses tenderness or scarring.  Meatus Normal size without lesions or prolapse.  Cervix: Normal appearance.  No lesions.  Anus: Normal exam.  No lesions.  Perineum: Normal exam.  No lesions.        Bimanual   Adnexae: No masses.  Non-tender to palpation.  Uterus: Enlarged. 154 BPM  Non-tender.  Mobile.  AV.  Adnexae: No masses.  Non-tender to palpation.  Cul-de-sac: Negative for abnormality.  Adnexae: No masses.  Non-tender to palpation.         Pelvimetry   Diagonal: Reached.  Spines: Average.  Sacrum: Concave.  Pubic Arch: Normal.

## 2021-07-16 NOTE — Addendum Note (Signed)
Addended by: Kathlene Cote on: 07/16/2021 11:50 AM   Modules accepted: Orders

## 2021-07-16 NOTE — Telephone Encounter (Signed)
error 

## 2021-07-16 NOTE — Addendum Note (Signed)
Addended by: Elonda Husky on: 07/16/2021 11:28 AM   Modules accepted: Level of Service

## 2021-07-18 LAB — CYTOLOGY - PAP: Diagnosis: NEGATIVE

## 2021-07-19 LAB — CBC WITH DIFFERENTIAL/PLATELET
Basophils Absolute: 0 10*3/uL (ref 0.0–0.2)
Basos: 0 %
EOS (ABSOLUTE): 0.1 10*3/uL (ref 0.0–0.4)
Eos: 1 %
Hematocrit: 34.2 % (ref 34.0–46.6)
Hemoglobin: 11.5 g/dL (ref 11.1–15.9)
Immature Grans (Abs): 0.1 10*3/uL (ref 0.0–0.1)
Immature Granulocytes: 1 %
Lymphocytes Absolute: 2 10*3/uL (ref 0.7–3.1)
Lymphs: 16 %
MCH: 27.1 pg (ref 26.6–33.0)
MCHC: 33.6 g/dL (ref 31.5–35.7)
MCV: 81 fL (ref 79–97)
Monocytes Absolute: 0.7 10*3/uL (ref 0.1–0.9)
Monocytes: 6 %
Neutrophils Absolute: 9.5 10*3/uL — ABNORMAL HIGH (ref 1.4–7.0)
Neutrophils: 76 %
Platelets: 346 10*3/uL (ref 150–450)
RBC: 4.24 x10E6/uL (ref 3.77–5.28)
RDW: 14.5 % (ref 11.7–15.4)
WBC: 12.4 10*3/uL — ABNORMAL HIGH (ref 3.4–10.8)

## 2021-07-19 LAB — AFP TETRA
DIA Value (EIA): 133.53 pg/mL
Gestational Age: 16 WEEKS
MSAFP: 27.2 ng/mL
MSHCG: 28762 m[IU]/mL
Maternal Age At EDD: 26 yr
uE3 Value: 0.59 ng/mL

## 2021-07-21 LAB — MATERNIT21  PLUS CORE+ESS+SCA, BLOOD
11q23 deletion (Jacobsen): NOT DETECTED
15q11 deletion (PW Angelman): NOT DETECTED
1p36 deletion syndrome: NOT DETECTED
22q11 deletion (DiGeorge): NOT DETECTED
4p16 deletion(Wolf-Hirschhorn): NOT DETECTED
5p15 deletion (Cri-du-chat): NOT DETECTED
8q24 deletion (Langer-Giedion): NOT DETECTED
Fetal Fraction: 5
Monosomy X (Turner Syndrome): NOT DETECTED
Result (T21): NEGATIVE
Trisomy 13 (Patau syndrome): NEGATIVE
Trisomy 16: NOT DETECTED
Trisomy 18 (Edwards syndrome): NEGATIVE
Trisomy 21 (Down syndrome): NEGATIVE
Trisomy 22: NOT DETECTED
XXX (Triple X Syndrome): NOT DETECTED
XXY (Klinefelter Syndrome): NOT DETECTED
XYY (Jacobs Syndrome): NOT DETECTED

## 2021-07-30 ENCOUNTER — Other Ambulatory Visit: Payer: Medicaid Other

## 2021-07-30 ENCOUNTER — Other Ambulatory Visit: Payer: Self-pay

## 2021-07-31 LAB — GLUCOSE TOLERANCE, 1 HOUR: Glucose, 1Hr PP: 169 mg/dL (ref 70–199)

## 2021-08-03 NOTE — L&D Delivery Note (Signed)
Delivery Summary for Lori Henry ? ?Labor Events:   ?Preterm labor: No data found  ?Rupture date: 12/10/2021  ?Rupture time: 5:51 PM  ?Rupture type: Spontaneous  ?Fluid Color: Clear ?White ?Yellow  ?Induction: No data found  ?Augmentation: No data found  ?Complications: No data found  ?Cervical ripening: No data found No data found  ? No data found  ?   ?Delivery:   ?Episiotomy: No data found  ?Lacerations: No data found  ?Repair suture: No data found  ?Repair # of packets: No data found  ?Blood loss (ml): 488  ? ?Information for the patient's newborn:  [name], [name] [[id number]]  ? ?Delivery ?12/11/2021 6:43 PM by  C-Section, Low Transverse ?Sex:  female Gestational Age: [redacted]w[redacted]d ?Delivery Clinician:   ?Living?:  ? ?      APGARS  One minute Five minutes Ten minutes  ?Skin color:        ?Heart rate:        ?Grimace:        ?Muscle tone:        ?Breathing:        ?Totals: 8  9     ? ?Presentation/position:      ?Resuscitation:   ?Cord information:    Disposition of cord blood:     Blood gases sent?  ?Complications:   ?Placenta: Delivered:       appearance ?Newborn Measurements: ?Weight: 4 lb 6.2 oz (1990 g)  Height: 17.13"  Head circumference:    Chest circumference:    ?Other providers:    ?Additional  information: ?Forceps:   ?Vacuum:   ?Breech:   ?Observed anomalies   ? ? ? ? ? ?See Dr. Oretha Milch operative note for details of C-section procedure.  ? ? ?Dr. Valentino Saxon ?

## 2021-08-05 ENCOUNTER — Encounter: Payer: Self-pay | Admitting: Anesthesiology

## 2021-08-05 ENCOUNTER — Other Ambulatory Visit
Admission: RE | Admit: 2021-08-05 | Discharge: 2021-08-05 | Disposition: A | Discharge status: Home / Self Care | Payer: Medicaid Other | Source: Ambulatory Visit | Attending: Anesthesiology | Admitting: Anesthesiology

## 2021-08-05 ENCOUNTER — Other Ambulatory Visit: Payer: Self-pay

## 2021-08-06 NOTE — Consult Note (Signed)
[facility] Anesthesia Consultation  Lori Henry JOI:[id number] DOB: 1995/10/11 DOA: 08/05/2021 PCP: [facility]   Requesting physician: Dr. Logan Bores Date of consultation: 08/05/2020 Reason for consultation: Obesity during pregnancy  CHIEF COMPLAINT:  Obesity during pregnancy  HISTORY OF PRESENT ILLNESS: Lori Henry  is a 26 y.o. female with a known history of morbid obesity. She is a G1P0 in second trimester. She reports having uncontrolled GERD. She reports snoring but denies other indications for OSA other than an elevated BMI. She is not interested in an epidural for labor analgesia at this time due to concerns of having chronic back pain from the procedure. She denies currently having back pains or neurological problems.   PAST MEDICAL HISTORY:   Past Medical History:  Diagnosis Date   Anxiety    Depression    Hypertension    Morbid obesity (HCC)    Seizures (HCC)    Febrile seizures as a child and undiagnosed seizure on 01/2015    PAST SURGICAL HISTORY:  Past Surgical History:  Procedure Laterality Date   CHOLECYSTECTOMY N/A 10/23/2016   Procedure: LAPAROSCOPIC CHOLECYSTECTOMY WITH INTRAOPERATIVE CHOLANGIOGRAM;  Surgeon: Nadeen Landau, MD;  Location: [facility] ORS;  Service: General;  Laterality: N/A;   MOUTH BIOPSY     benign   MOUTH SURGERY     WISDOM TOOTH EXTRACTION      SOCIAL HISTORY:  Social History   Tobacco Use   Smoking status: Former    Packs/day: 0.25    Types: Cigarettes   Smokeless tobacco: Former    Types: Chew  Substance Use Topics   Alcohol use: No    FAMILY HISTORY:  Family History  Problem Relation Age of Onset   Hyperlipidemia Mother    Diabetes Mother    Hypertension Mother    Healthy Father     DRUG ALLERGIES:  Allergies  Allergen Reactions   Bee Venom Anaphylaxis    REVIEW OF SYSTEMS:   RESPIRATORY: No cough, shortness of breath, wheezing.  CARDIOVASCULAR: No chest  pain, orthopnea, edema.  HEMATOLOGY: No anemia, easy bruising or bleeding GI: + Reflux SKIN: No rash or lesion. NEUROLOGIC: No tingling, numbness, weakness.  PSYCHIATRY: No anxiety or depression.   MEDICATIONS AT HOME:  Prior to Admission medications   Medication Sig Start Date End Date Taking? Authorizing Provider  aspirin EC 81 MG tablet Take 81 mg by mouth daily. Swallow whole.    [provider]  ondansetron (ZOFRAN-ODT) 4 MG disintegrating tablet Take 1 tablet (4 mg total) by mouth every 8 (eight) hours as needed. Patient not taking: Reported on 07/16/2021 06/27/21   Faythe Ghee, PA-C  Prenatal Vit-Fe Fumarate-FA (PRENATAL MULTIVITAMIN) TABS tablet Take 1 tablet by mouth daily at 12 noon.    [provider]      PHYSICAL EXAMINATION:   VITAL SIGNS: There were no vitals taken for this visit.  GENERAL:  26 y.o.-year-old patient no acute distress.  HEENT: Head atraumatic, normocephalic. Oropharynx and nasopharynx clear. MP 3, TM distance >3 cm, normal mouth opening. BACK: Unable to palpate spinous processes in lumbar spine.  LUNGS: No use of accessory muscles of respiration.   EXTREMITIES: No pedal edema, cyanosis, or clubbing.  NEUROLOGIC: normal gait PSYCHIATRIC: The patient is alert and oriented x 3.  SKIN: No obvious rash, lesion, or ulcer.    IMPRESSION AND PLAN:   Lori Henry  is a 26 y.o. female presenting with obesity during pregnancy. BMI is currently 45 at [redacted] weeks gestation.  We discussed analgesic options during labor including epidural analgesia. Discussed that in obesity there can be increased difficulty with epidural placement or even failure of successful epidural. We also discussed that even after successful epidural placement there is increased risk of catheter migration out of the epidural space that would require catheter replacement. Discussed use of epidural vs spinal vs GA if cesarean delivery is required. Discussed increased  risk of difficult intubation during pregnancy should an emergency cesarean delivery be required.   If your BMI ? 49 at your 34 week appointment you will have to be evaluated by an anesthesiologist prior to 36 weeks. This evaluation will specifically determine whether she is at high risk for complications of anesthesia. If she is high risk for complications of anesthesia your OB provider will be directed to transfer your care to an OB provider at a hospital that has a higher maternal level of care designation

## 2021-08-13 ENCOUNTER — Ambulatory Visit (INDEPENDENT_AMBULATORY_CARE_PROVIDER_SITE_OTHER): Payer: Medicaid Other

## 2021-08-13 ENCOUNTER — Other Ambulatory Visit: Payer: Medicaid Other

## 2021-08-13 ENCOUNTER — Encounter: Payer: Self-pay | Admitting: Obstetrics and Gynecology

## 2021-08-13 ENCOUNTER — Other Ambulatory Visit: Payer: Self-pay

## 2021-08-13 ENCOUNTER — Ambulatory Visit (INDEPENDENT_AMBULATORY_CARE_PROVIDER_SITE_OTHER): Payer: Medicaid Other | Admitting: Obstetrics and Gynecology

## 2021-08-13 ENCOUNTER — Encounter: Payer: Medicaid Other | Admitting: Obstetrics and Gynecology

## 2021-08-13 VITALS — BP 121/88 | HR 99 | Wt 271.0 lb

## 2021-08-13 DIAGNOSIS — Z362 Encounter for other antenatal screening follow-up: Secondary | ICD-10-CM | POA: Diagnosis not present

## 2021-08-13 DIAGNOSIS — Z3402 Encounter for supervision of normal first pregnancy, second trimester: Secondary | ICD-10-CM

## 2021-08-13 DIAGNOSIS — Z3A16 16 weeks gestation of pregnancy: Secondary | ICD-10-CM | POA: Diagnosis not present

## 2021-08-13 DIAGNOSIS — O99810 Abnormal glucose complicating pregnancy: Secondary | ICD-10-CM

## 2021-08-13 DIAGNOSIS — Z6841 Body Mass Index (BMI) 40.0 and over, adult: Secondary | ICD-10-CM

## 2021-08-13 NOTE — Patient Instructions (Signed)

## 2021-08-13 NOTE — Progress Notes (Signed)
ROB: Reports pain in upper vagina x 2 weeks, mostly feels like pressure.  Discussed use of belly band.  Also noting breast tenderness, discussed home comfort measures. Had anatomy scan today, incomplete, return in 2 weeks to complete.  Early glucola elevated, needs 3 hr GTT. S/p early anesthesia consult, for repeat at 34-36 weeks if BMI > 49. RTC in 4 weeks for OB visit. The patient has Medicaid.  [facility] Medicaid Risk Screening Form completed today.

## 2021-08-14 ENCOUNTER — Other Ambulatory Visit: Payer: Medicaid Other

## 2021-08-14 ENCOUNTER — Other Ambulatory Visit: Payer: Self-pay

## 2021-08-14 DIAGNOSIS — Z131 Encounter for screening for diabetes mellitus: Secondary | ICD-10-CM

## 2021-08-15 ENCOUNTER — Telehealth: Payer: Self-pay | Admitting: Obstetrics and Gynecology

## 2021-08-15 ENCOUNTER — Encounter: Payer: Self-pay | Admitting: Obstetrics and Gynecology

## 2021-08-15 LAB — GESTATIONAL GLUCOSE TOLERANCE
Glucose, Fasting: 93 mg/dL (ref 70–94)
Glucose, GTT - 1 Hour: 169 mg/dL (ref 70–179)
Glucose, GTT - 2 Hour: 149 mg/dL (ref 70–154)
Glucose, GTT - 3 Hour: 170 mg/dL — ABNORMAL HIGH (ref 70–139)

## 2021-08-15 NOTE — Telephone Encounter (Signed)
Pt called and had received her results from her glucose test and wanted further elaboration. Told patient to allow 48 to 72 for results to be explained by a physician. Also stated to keep check on mychart for updates/notes.

## 2021-08-15 NOTE — Telephone Encounter (Signed)
Pt called and stated she received her glucose results and is not clear on how to read the results. Wants to know what the results say about her glucose levels.

## 2021-08-15 NOTE — Telephone Encounter (Signed)
Sent patient a mychart asking her to allow provider 48-72 hours to respond to lab results.

## 2021-08-17 ENCOUNTER — Other Ambulatory Visit: Payer: Self-pay | Admitting: Obstetrics and Gynecology

## 2021-08-17 NOTE — Progress Notes (Signed)
Crystal: She has passed her 3-hour GTT and is not considered a gestational diabetic.  Based on her results, she should likely undergo some dietary modification to include decreased sugar and carbohydrate intake.  This would be similar to a gestational diabetic diet.

## 2021-08-27 ENCOUNTER — Other Ambulatory Visit: Payer: Self-pay

## 2021-08-27 ENCOUNTER — Ambulatory Visit (INDEPENDENT_AMBULATORY_CARE_PROVIDER_SITE_OTHER): Payer: Medicaid Other

## 2021-08-27 DIAGNOSIS — Z362 Encounter for other antenatal screening follow-up: Secondary | ICD-10-CM

## 2021-09-04 ENCOUNTER — Other Ambulatory Visit: Payer: Self-pay

## 2021-09-04 ENCOUNTER — Encounter: Payer: Self-pay | Admitting: Obstetrics and Gynecology

## 2021-09-04 ENCOUNTER — Observation Stay
Admission: EM | Admit: 2021-09-04 | Discharge: 2021-09-04 | Disposition: A | Discharge status: Home / Self Care | Payer: Medicaid Other | Attending: Obstetrics and Gynecology | Admitting: Obstetrics and Gynecology

## 2021-09-04 DIAGNOSIS — Z3490 Encounter for supervision of normal pregnancy, unspecified, unspecified trimester: Secondary | ICD-10-CM

## 2021-09-04 DIAGNOSIS — O26892 Other specified pregnancy related conditions, second trimester: Secondary | ICD-10-CM | POA: Diagnosis present

## 2021-09-04 DIAGNOSIS — R1033 Periumbilical pain: Secondary | ICD-10-CM | POA: Diagnosis not present

## 2021-09-04 DIAGNOSIS — Z3A23 23 weeks gestation of pregnancy: Secondary | ICD-10-CM | POA: Insufficient documentation

## 2021-09-04 DIAGNOSIS — R109 Unspecified abdominal pain: Secondary | ICD-10-CM

## 2021-09-04 DIAGNOSIS — M549 Dorsalgia, unspecified: Secondary | ICD-10-CM | POA: Insufficient documentation

## 2021-09-04 LAB — URINALYSIS, ROUTINE W REFLEX MICROSCOPIC
Bilirubin Urine: NEGATIVE
Glucose, UA: NEGATIVE mg/dL
Hgb urine dipstick: NEGATIVE
Ketones, ur: NEGATIVE mg/dL
Nitrite: NEGATIVE
Protein, ur: NEGATIVE mg/dL
Specific Gravity, Urine: >1.03 — ABNORMAL HIGH (ref 1.005–1.030)
pH: 5.5 (ref 5.0–8.0)

## 2021-09-04 LAB — URINALYSIS, MICROSCOPIC (REFLEX)

## 2021-09-04 MED ORDER — ACETAMINOPHEN 500 MG PO TABS
1000.0000 mg | ORAL_TABLET | Freq: Four times a day (QID) | ORAL | Status: DC | PRN
  Administered 2021-09-04: 1000 mg via ORAL
  Filled 2021-09-04: qty 2

## 2021-09-04 NOTE — OB Triage Note (Signed)
Pt is a 25yo G1P0, 23w 1d. She arrived to the unit with complaints of abdominal pain 6/10. She stated that her urine had a strong foul odor. 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 She stated that she has not taken any medication for this pain.  She denies vaginal bleeding, reports positive fetal movement. VS stable, monitors applied and assessing.   Initial FHT 145 at 2213.

## 2021-09-05 DIAGNOSIS — O26892 Other specified pregnancy related conditions, second trimester: Secondary | ICD-10-CM | POA: Diagnosis not present

## 2021-09-05 DIAGNOSIS — R109 Unspecified abdominal pain: Secondary | ICD-10-CM

## 2021-09-05 NOTE — Final Progress Note (Signed)
L&D OB Triage Note  Lori Henry is a 26 y.o. G1P0 female at [redacted]w[redacted]d, EDD Estimated Date of Delivery: 12/31/21 who presented to triage for complaints of abdominal pain, 6/10 x 1 day.  Pain noted to originate at her umbilicus and radiate down towards her pelvis.  Also noting some back pain 2 days ago. Has not attempted to take anything for the pain.  She was evaluated by the nurses with no emergent or significant findings. Vital signs stable. An NST was performed and has been reviewed by MD. She was treated with PO Tylenol with resolution of her pain.   NST INTERPRETATION: Indications: rule out uterine contractions  Mode: External Baseline Rate (A): 140 bpm (fht) Variability: Moderate Accelerations: 15 x 15 Decelerations: None     Contraction Frequency (min): none noted  Impression: reactive    Labs:   Results for orders placed or performed during the hospital encounter of 09/04/21  Urinalysis, Routine w reflex microscopic  Result Value Ref Range   Color, Urine YELLOW YELLOW   APPearance CLEAR CLEAR   Specific Gravity, Urine >1.030 (H) 1.005 - 1.030   pH 5.5 5.0 - 8.0   Glucose, UA NEGATIVE NEGATIVE mg/dL   Hgb urine dipstick NEGATIVE NEGATIVE   Bilirubin Urine NEGATIVE NEGATIVE   Ketones, ur NEGATIVE NEGATIVE mg/dL   Protein, ur NEGATIVE NEGATIVE mg/dL   Nitrite NEGATIVE NEGATIVE   Leukocytes,Ua TRACE (A) NEGATIVE  Urinalysis, Microscopic (reflex)  Result Value Ref Range   RBC / HPF 0-5 0 - 5 RBC/hpf   WBC, UA 0-5 0 - 5 WBC/hpf   Bacteria, UA RARE (A) NONE SEEN   Squamous Epithelial / LPF 6-10 0 - 5   Mucus PRESENT      Plan: NST performed was reviewed and was found to be reactive. She was discharged home with bleeding/labor precautions.  Continue routine prenatal care. Follow up with OB/GYN as previously scheduled.     Hildred Laser, MD [facility]

## 2021-09-08 ENCOUNTER — Encounter: Payer: Self-pay | Admitting: Obstetrics and Gynecology

## 2021-09-10 ENCOUNTER — Encounter: Payer: Self-pay | Admitting: Obstetrics and Gynecology

## 2021-09-10 ENCOUNTER — Other Ambulatory Visit: Payer: Self-pay

## 2021-09-10 ENCOUNTER — Ambulatory Visit (INDEPENDENT_AMBULATORY_CARE_PROVIDER_SITE_OTHER): Payer: Medicaid Other | Admitting: Obstetrics and Gynecology

## 2021-09-10 VITALS — BP 122/93 | HR 111 | Wt 276.0 lb

## 2021-09-10 DIAGNOSIS — Z3402 Encounter for supervision of normal first pregnancy, second trimester: Secondary | ICD-10-CM

## 2021-09-10 DIAGNOSIS — Z3A24 24 weeks gestation of pregnancy: Secondary | ICD-10-CM

## 2021-09-10 LAB — POCT URINALYSIS DIPSTICK OB
Bilirubin, UA: NEGATIVE
Blood, UA: NEGATIVE
Glucose, UA: NEGATIVE
Ketones, UA: NEGATIVE
Nitrite, UA: NEGATIVE
POC,PROTEIN,UA: NEGATIVE
Spec Grav, UA: 1.015 (ref 1.010–1.025)
Urobilinogen, UA: 0.2 E.U./dL
pH, UA: 6.5 (ref 5.0–8.0)

## 2021-09-10 NOTE — Progress Notes (Signed)
ROB. Patient states experiencing morning sickness at different times of the day. Patient states a recent fall on a black top, states she caught herself with her hands and did not hurt her stomach. Patient states recent hospital visit due to pain around her umbilicus and downwards, states there was an intense sharp pain. Patient states the hospital stating no contractions and monitored baby, trace leukocytes. Patient also reports foot swelling. Patient states feeling fine now from previous visit. Patient reports that her R eye has been blurry and very dry, states OTC eye drops provide no relief. Patient states no other questions or concerns.

## 2021-09-10 NOTE — Progress Notes (Signed)
ROB: She is not feeling the baby move yet.  We have discussed this in some detail.  Baby obviously moving when listening with the Doppler.  Dry eyes during pregnancy discussed.  Umbilical pain during pregnancy discussed.  Patient failed her early 1 hour GCT but passed her early 3-hour GTT.  She is now due for 28-week glucose check.  We will plan to do 3-hour GTT at next visit.  Rationale discussed with patient.

## 2021-09-11 ENCOUNTER — Other Ambulatory Visit: Payer: Self-pay | Admitting: Obstetrics and Gynecology

## 2021-09-11 DIAGNOSIS — Z3402 Encounter for supervision of normal first pregnancy, second trimester: Secondary | ICD-10-CM

## 2021-09-26 ENCOUNTER — Encounter: Payer: Self-pay | Admitting: Obstetrics and Gynecology

## 2021-09-26 ENCOUNTER — Observation Stay
Admission: EM | Admit: 2021-09-26 | Discharge: 2021-09-26 | Disposition: A | Discharge status: Home / Self Care | Payer: Medicaid Other | Attending: Obstetrics and Gynecology | Admitting: Obstetrics and Gynecology

## 2021-09-26 DIAGNOSIS — O10012 Pre-existing essential hypertension complicating pregnancy, second trimester: Secondary | ICD-10-CM

## 2021-09-26 DIAGNOSIS — O10912 Unspecified pre-existing hypertension complicating pregnancy, second trimester: Principal | ICD-10-CM

## 2021-09-26 DIAGNOSIS — N939 Abnormal uterine and vaginal bleeding, unspecified: Secondary | ICD-10-CM

## 2021-09-26 DIAGNOSIS — Z3A26 26 weeks gestation of pregnancy: Secondary | ICD-10-CM | POA: Diagnosis not present

## 2021-09-26 DIAGNOSIS — Z87440 Personal history of urinary (tract) infections: Secondary | ICD-10-CM | POA: Diagnosis not present

## 2021-09-26 DIAGNOSIS — R109 Unspecified abdominal pain: Secondary | ICD-10-CM

## 2021-09-26 DIAGNOSIS — O4692 Antepartum hemorrhage, unspecified, second trimester: Secondary | ICD-10-CM | POA: Diagnosis not present

## 2021-09-26 DIAGNOSIS — O26892 Other specified pregnancy related conditions, second trimester: Secondary | ICD-10-CM | POA: Diagnosis not present

## 2021-09-26 DIAGNOSIS — Z87442 Personal history of urinary calculi: Secondary | ICD-10-CM | POA: Diagnosis not present

## 2021-09-26 DIAGNOSIS — O26852 Spotting complicating pregnancy, second trimester: Secondary | ICD-10-CM | POA: Diagnosis present

## 2021-09-26 LAB — URINALYSIS, COMPLETE (UACMP) WITH MICROSCOPIC
Bilirubin Urine: NEGATIVE
Glucose, UA: NEGATIVE mg/dL
Hgb urine dipstick: NEGATIVE
Ketones, ur: NEGATIVE mg/dL
Nitrite: NEGATIVE
Protein, ur: NEGATIVE mg/dL
Specific Gravity, Urine: 1.017 (ref 1.005–1.030)
pH: 6 (ref 5.0–8.0)

## 2021-09-26 LAB — COMPREHENSIVE METABOLIC PANEL
ALT: 22 U/L (ref 0–44)
AST: 21 U/L (ref 15–41)
Albumin: 3.1 g/dL — ABNORMAL LOW (ref 3.5–5.0)
Alkaline Phosphatase: 75 U/L (ref 38–126)
Anion gap: 8 (ref 5–15)
BUN: 7 mg/dL (ref 6–20)
CO2: 24 mmol/L (ref 22–32)
Calcium: 9.1 mg/dL (ref 8.9–10.3)
Chloride: 105 mmol/L (ref 98–111)
Creatinine, Ser: 0.62 mg/dL (ref 0.44–1.00)
GFR, Estimated: >60 mL/min (ref >60)
Glucose, Bld: 123 mg/dL — ABNORMAL HIGH (ref 70–99)
Potassium: 3.3 mmol/L — ABNORMAL LOW (ref 3.5–5.1)
Sodium: 137 mmol/L (ref 135–145)
Total Bilirubin: 0.3 mg/dL (ref 0.3–1.2)
Total Protein: 6.6 g/dL (ref 6.5–8.1)

## 2021-09-26 LAB — PROTEIN / CREATININE RATIO, URINE
Creatinine, Urine: 156 mg/dL
Protein Creatinine Ratio: 0.08 mg/mg{Cre} (ref 0.00–0.15)
Total Protein, Urine: 13 mg/dL

## 2021-09-26 LAB — CBC WITH DIFFERENTIAL/PLATELET
Abs Immature Granulocytes: 0.09 10*3/uL — ABNORMAL HIGH (ref 0.00–0.07)
Basophils Absolute: 0 10*3/uL (ref 0.0–0.1)
Basophils Relative: 0 %
Eosinophils Absolute: 0.1 10*3/uL (ref 0.0–0.5)
Eosinophils Relative: 1 %
HCT: 32.2 % — ABNORMAL LOW (ref 36.0–46.0)
Hemoglobin: 10.5 g/dL — ABNORMAL LOW (ref 12.0–15.0)
Immature Granulocytes: 1 %
Lymphocytes Relative: 14 %
Lymphs Abs: 1.8 10*3/uL (ref 0.7–4.0)
MCH: 27.1 pg (ref 26.0–34.0)
MCHC: 32.6 g/dL (ref 30.0–36.0)
MCV: 83.2 fL (ref 80.0–100.0)
Monocytes Absolute: 0.7 10*3/uL (ref 0.1–1.0)
Monocytes Relative: 6 %
Neutro Abs: 10.5 10*3/uL — ABNORMAL HIGH (ref 1.7–7.7)
Neutrophils Relative %: 78 %
Platelets: 315 10*3/uL (ref 150–400)
RBC: 3.87 MIL/uL (ref 3.87–5.11)
RDW: 14 % (ref 11.5–15.5)
WBC: 13.3 10*3/uL — ABNORMAL HIGH (ref 4.0–10.5)
nRBC: 0 % (ref 0.0–0.2)

## 2021-09-26 LAB — WET PREP, GENITAL
Clue Cells Wet Prep HPF POC: NONE SEEN
Sperm: NONE SEEN
Trich, Wet Prep: NONE SEEN
WBC, Wet Prep HPF POC: <10 — AB (ref <10)
Yeast Wet Prep HPF POC: NONE SEEN

## 2021-09-26 MED ORDER — LABETALOL HCL 100 MG PO TABS
200.0000 mg | ORAL_TABLET | Freq: Two times a day (BID) | ORAL | Status: DC
  Administered 2021-09-26: 200 mg via ORAL
  Filled 2021-09-26: qty 2

## 2021-09-26 MED ORDER — LABETALOL HCL 100 MG PO TABS: 100.0000 mg | ORAL_TABLET | Freq: Two times a day (BID) | ORAL | 6 refills | Status: DC

## 2021-09-26 MED ORDER — PRENATAL GUMMIES/DHA & FA 0.4-32.5 MG PO CHEW: 2.0000 | CHEWABLE_TABLET | Freq: Every day | ORAL | 3 refills | Status: DC

## 2021-09-26 NOTE — Final Progress Note (Signed)
L&D OB Triage Note  HPI: Lori Henry is a 26 y.o. G1P0 female at [redacted]w[redacted]d, EDD Estimated Date of Delivery: 12/31/21 who presented to triage for complaints of abdominal cramping that began around 2200 last night and vaginal spotting since midnight.  She reports good fetal movement and denies leakage of fluids.  She was evaluated by the nurses with no significant findings. Vital signs noting elevated blood pressures. Patient has a history of cHTN, currently on no meds. An NST was performed and has been reviewed by MD. She was treated with Labetalol 200 mg PO.    Physical Exam:  Vitals:   09/26/21 1336 09/26/21 1351 09/26/21 1406 09/26/21 1421  BP: (!) 148/89 (!) 140/95 (!) 143/98 (!) 143/90  Pulse: 83 84 80 86  Temp:      TempSrc:      Weight:      Height:       Gen App: NAD Cervix: deferred.    NST INTERPRETATION: Indications: rule out uterine contractions and chronic hypertension  Mode: External Baseline Rate (A): 140 bpm Variability: Moderate Accelerations: 15 x 15 Decelerations: None     Contraction Frequency (min): none  Impression: reactive    Labs:  Results for orders placed or performed during the hospital encounter of 09/26/21  Wet prep, genital   Specimen: Vaginal  Result Value Ref Range   Yeast Wet Prep HPF POC NONE SEEN NONE SEEN   Trich, Wet Prep NONE SEEN NONE SEEN   Clue Cells Wet Prep HPF POC NONE SEEN NONE SEEN   WBC, Wet Prep HPF POC <10 (A) <10   Sperm NONE SEEN   CBC with Differential/Platelet  Result Value Ref Range   WBC 13.3 (H) 4.0 - 10.5 K/uL   RBC 3.87 3.87 - 5.11 MIL/uL   Hemoglobin 10.5 (L) 12.0 - 15.0 g/dL   HCT 32.2 (L) 36.0 - 46.0 %   MCV 83.2 80.0 - 100.0 fL   MCH 27.1 26.0 - 34.0 pg   MCHC 32.6 30.0 - 36.0 g/dL   RDW 14.0 11.5 - 15.5 %   Platelets 315 150 - 400 K/uL   nRBC 0.0 0.0 - 0.2 %   Neutrophils Relative % 78 %   Neutro Abs 10.5 (H) 1.7 - 7.7 K/uL   Lymphocytes Relative 14 %   Lymphs Abs 1.8 0.7 - 4.0 K/uL    Monocytes Relative 6 %   Monocytes Absolute 0.7 0.1 - 1.0 K/uL   Eosinophils Relative 1 %   Eosinophils Absolute 0.1 0.0 - 0.5 K/uL   Basophils Relative 0 %   Basophils Absolute 0.0 0.0 - 0.1 K/uL   Immature Granulocytes 1 %   Abs Immature Granulocytes 0.09 (H) 0.00 - 0.07 K/uL  Comprehensive metabolic panel  Result Value Ref Range   Sodium 137 135 - 145 mmol/L   Potassium 3.3 (L) 3.5 - 5.1 mmol/L   Chloride 105 98 - 111 mmol/L   CO2 24 22 - 32 mmol/L   Glucose, Bld 123 (H) 70 - 99 mg/dL   BUN 7 6 - 20 mg/dL   Creatinine, Ser 0.62 0.44 - 1.00 mg/dL   Calcium 9.1 8.9 - 10.3 mg/dL   Total Protein 6.6 6.5 - 8.1 g/dL   Albumin 3.1 (L) 3.5 - 5.0 g/dL   AST 21 15 - 41 U/L   ALT 22 0 - 44 U/L   Alkaline Phosphatase 75 38 - 126 U/L   Total Bilirubin 0.3 0.3 - 1.2 mg/dL   GFR,  Estimated >60 >60 mL/min   Anion gap 8 5 - 15  Protein / creatinine ratio, urine  Result Value Ref Range   Creatinine, Urine 156 mg/dL   Total Protein, Urine 13 mg/dL   Protein Creatinine Ratio 0.08 0.00 - 0.15 mg/mg[Cre]  Urinalysis, Complete w Microscopic Urine, Clean Catch  Result Value Ref Range   Color, Urine YELLOW (A) YELLOW   APPearance HAZY (A) CLEAR   Specific Gravity, Urine 1.017 1.005 - 1.030   pH 6.0 5.0 - 8.0   Glucose, UA NEGATIVE NEGATIVE mg/dL   Hgb urine dipstick NEGATIVE NEGATIVE   Bilirubin Urine NEGATIVE NEGATIVE   Ketones, ur NEGATIVE NEGATIVE mg/dL   Protein, ur NEGATIVE NEGATIVE mg/dL   Nitrite NEGATIVE NEGATIVE   Leukocytes,Ua TRACE (A) NEGATIVE   RBC / HPF 11-20 0 - 5 RBC/hpf   WBC, UA 6-10 0 - 5 WBC/hpf   Bacteria, UA RARE (A) NONE SEEN   Squamous Epithelial / LPF 11-20 0 - 5   Mucus PRESENT      Plan: NST performed was reviewed and was found to be reactive. No further spotting noted while in triage. UA and vaginitis workup negative. PIH evaluation negative. She was discharged home with bleeding/labor precautions.  Also given a prescription for Labetalol 200 mg BID. Continue  routine prenatal care. Follow up with OB/GYN in 3-5 days for BP check.    Rubie Maid, MD [facility]

## 2021-09-26 NOTE — OB Triage Note (Signed)
Pt is a 25yo G1P0 at [redacted]w[redacted]d that presents from ED with C/o abdominal cramping that started around 2200 and some spotting that happened around 0000. Pt states positive FM and denies LOF. Pt states she has history of chronic UTI's and kidney Stones. Pt had no VB on assessment byu RN just some normal Discharge. EFM applied and initial FHT 155

## 2021-09-26 NOTE — OB Triage Note (Signed)
Patient Discharged home per provider. Pt educated about labor precautions and informed when to return to the ED for further evaluation. Pt instructed when to return or call for elevated Blood pressures and about all new medications. Pt instructed to keep all follow up appointments with her provider. AVS given to patient and RN answered all questions and patient has no further questions at this time. Pt discharged home in stable condition with mother.

## 2021-09-27 DIAGNOSIS — N939 Abnormal uterine and vaginal bleeding, unspecified: Secondary | ICD-10-CM

## 2021-09-27 DIAGNOSIS — Z87440 Personal history of urinary (tract) infections: Secondary | ICD-10-CM | POA: Diagnosis not present

## 2021-09-27 DIAGNOSIS — Z87442 Personal history of urinary calculi: Secondary | ICD-10-CM | POA: Diagnosis not present

## 2021-09-29 ENCOUNTER — Ambulatory Visit (INDEPENDENT_AMBULATORY_CARE_PROVIDER_SITE_OTHER): Payer: Medicaid Other | Admitting: Obstetrics and Gynecology

## 2021-09-29 ENCOUNTER — Other Ambulatory Visit: Payer: Self-pay

## 2021-09-29 VITALS — BP 139/88 | HR 90 | Ht 65.0 in | Wt 280.5 lb

## 2021-09-29 DIAGNOSIS — O10912 Unspecified pre-existing hypertension complicating pregnancy, second trimester: Secondary | ICD-10-CM

## 2021-09-29 DIAGNOSIS — H538 Other visual disturbances: Secondary | ICD-10-CM

## 2021-09-29 NOTE — Progress Notes (Signed)
Pt presents for BP check per AC. BP today 139/88, P 90, BMI 46.68. Pt reports blurred vision x 2 days but no other symptoms and verbalizes no concerns at this time. Pt to follow up w provider for ROB. All questions answered.

## 2021-10-01 ENCOUNTER — Telehealth: Payer: Self-pay | Admitting: Obstetrics and Gynecology

## 2021-10-01 NOTE — Telephone Encounter (Signed)
Advised patient via my chart

## 2021-10-01 NOTE — Telephone Encounter (Signed)
Pt called- stating that she is 27 weeks and is having swelling in hand, legs and feet. Pt states that her hands are worse and she has no sensation - currently hold an ice cold drink and can not feel it- please advise. ?

## 2021-10-03 ENCOUNTER — Encounter: Payer: Self-pay | Admitting: Obstetrics and Gynecology

## 2021-10-04 ENCOUNTER — Encounter: Payer: Self-pay | Admitting: Obstetrics and Gynecology

## 2021-10-04 ENCOUNTER — Observation Stay
Admission: EM | Admit: 2021-10-04 | Discharge: 2021-10-04 | Disposition: A | Discharge status: Home / Self Care | Payer: Medicaid Other | Attending: Obstetrics and Gynecology | Admitting: Obstetrics and Gynecology

## 2021-10-04 ENCOUNTER — Other Ambulatory Visit: Payer: Self-pay

## 2021-10-04 DIAGNOSIS — Y92002 Bathroom of unspecified non-institutional (private) residence single-family (private) house as the place of occurrence of the external cause: Secondary | ICD-10-CM | POA: Insufficient documentation

## 2021-10-04 DIAGNOSIS — Z3A27 27 weeks gestation of pregnancy: Secondary | ICD-10-CM | POA: Insufficient documentation

## 2021-10-04 DIAGNOSIS — R1012 Left upper quadrant pain: Secondary | ICD-10-CM | POA: Diagnosis not present

## 2021-10-04 DIAGNOSIS — O26892 Other specified pregnancy related conditions, second trimester: Secondary | ICD-10-CM | POA: Diagnosis not present

## 2021-10-04 DIAGNOSIS — W182XXA Fall in (into) shower or empty bathtub, initial encounter: Secondary | ICD-10-CM | POA: Insufficient documentation

## 2021-10-04 DIAGNOSIS — S3991XA Unspecified injury of abdomen, initial encounter: Secondary | ICD-10-CM | POA: Diagnosis not present

## 2021-10-04 DIAGNOSIS — O9A212 Injury, poisoning and certain other consequences of external causes complicating pregnancy, second trimester: Principal | ICD-10-CM | POA: Insufficient documentation

## 2021-10-04 DIAGNOSIS — W19XXXA Unspecified fall, initial encounter: Secondary | ICD-10-CM | POA: Diagnosis not present

## 2021-10-04 DIAGNOSIS — O368120 Decreased fetal movements, second trimester, not applicable or unspecified: Secondary | ICD-10-CM | POA: Diagnosis not present

## 2021-10-04 MED ORDER — ACETAMINOPHEN 500 MG PO TABS
1000.0000 mg | ORAL_TABLET | Freq: Four times a day (QID) | ORAL | Status: DC | PRN
  Administered 2021-10-04: 1000 mg via ORAL
  Filled 2021-10-04: qty 2

## 2021-10-04 NOTE — Final Progress Note (Signed)
L&D OB Triage Note ? ?Lori Henry is a 26 y.o. G1P0 female at [redacted]w[redacted]d, EDD Estimated Date of Delivery: 12/31/21 who presented to triage for complaints of fall after getting out of the shower at home several hours ago.  Does forward but unsure if hit her abdomen. Denies vaginal bleeding, contractions, leakage of fluid.  Notes decreased fetal movement after this, attempted to drink a sip of Pepsi with no fetal response.  Does report some left-sided abdominal pain 9/10. Has not taken anything for the pain. She was evaluated by the nurses with no significant findings. Vital signs stable. An NST was performed and has been reviewed by MD. She was treated with PO Tylenol 1000 mg, which reduced her pain to 5/10.  ? ?NST INTERPRETATION: ?Indications: recent abdominal trauma (s/p ground level fall) ? ?Mode: External ?Baseline Rate (A): 135 bpm ?Variability: Moderate ?Accelerations: 15 x 15 ?Decelerations: None ?  ?  ?Contraction Frequency (min): none noted ? ?Impression: reactive ? ? ?Plan: NST performed was reviewed and was found to be reactive. She was discharged home with bleeding/ preterm labor precautions.  Continue routine prenatal care. Follow up with OB/GYN as previously scheduled.  ? ? ? ?Hildred Laser, MD ?[facility] ? ?

## 2021-10-04 NOTE — Progress Notes (Addendum)
Pt discharged home per Valentino Saxon, MD order.  Pt stable and ambulatory and an After Visit Summary was printed and given to the patient. Discharge education completed with patient/family including follow up instructions, appointments, and medication list. NST reactive and appropriate for gestational age, no vaginal bleeding, and abd pain decreased after 1000 mg tylenol PO given. Pt received labor and bleeding precautions as well as preventing falls education. Patient able to verbalize understanding, all questions fully answered upon discharge. Patient instructed to return to ED, call 911, or call MD for any changes in condition. Pt discharged home via personal vehicle with support person.  ? ?

## 2021-10-04 NOTE — OB Triage Note (Signed)
Pt Lori Henry 25 y.o. presents to labor and delivery triage after a fall at home. Pt has her mom present in the triage room. Pt is a G1P0 at [redacted]w[redacted]d . Pt denies signs and symptons consistent with rupture of membranes or active vaginal bleeding. Pt denies contractions and states positive fetal movement up until the fall. Pt reports fall happened around 1230 this afternoon when she was getting out of the shower and reports, "I guess I didn't pick my foot up enough and fell on my stomach." Pt can not remember if she stuck her hands out in front but does reports falling right on her belly "because I knew there was nothing to grab onto." Pt reports sharp 9/10 lower mid/left abdominal pain right after the fall.  Pt reports taking a sip of pepsi to get baby to move but that did not work. External FM and TOCO applied to non-tender abdomen and assessing. Initial FHR 145 . Vital signs obtained and within normal limits. Provider notified of pt. ? ?

## 2021-10-08 ENCOUNTER — Other Ambulatory Visit: Payer: Medicaid Other

## 2021-10-08 ENCOUNTER — Encounter: Payer: Self-pay | Admitting: Obstetrics and Gynecology

## 2021-10-08 ENCOUNTER — Other Ambulatory Visit: Payer: Self-pay

## 2021-10-08 ENCOUNTER — Ambulatory Visit (INDEPENDENT_AMBULATORY_CARE_PROVIDER_SITE_OTHER): Payer: Medicaid Other | Admitting: Obstetrics and Gynecology

## 2021-10-08 VITALS — BP 139/102 | HR 107 | Wt 280.9 lb

## 2021-10-08 DIAGNOSIS — Z131 Encounter for screening for diabetes mellitus: Secondary | ICD-10-CM

## 2021-10-08 DIAGNOSIS — O10912 Unspecified pre-existing hypertension complicating pregnancy, second trimester: Secondary | ICD-10-CM

## 2021-10-08 DIAGNOSIS — O99210 Obesity complicating pregnancy, unspecified trimester: Secondary | ICD-10-CM

## 2021-10-08 DIAGNOSIS — Z23 Encounter for immunization: Secondary | ICD-10-CM

## 2021-10-08 DIAGNOSIS — Z3A28 28 weeks gestation of pregnancy: Secondary | ICD-10-CM

## 2021-10-08 DIAGNOSIS — O24419 Gestational diabetes mellitus in pregnancy, unspecified control: Secondary | ICD-10-CM

## 2021-10-08 DIAGNOSIS — R7989 Other specified abnormal findings of blood chemistry: Secondary | ICD-10-CM

## 2021-10-08 DIAGNOSIS — O0993 Supervision of high risk pregnancy, unspecified, third trimester: Secondary | ICD-10-CM

## 2021-10-08 DIAGNOSIS — Z113 Encounter for screening for infections with a predominantly sexual mode of transmission: Secondary | ICD-10-CM

## 2021-10-08 DIAGNOSIS — Z3402 Encounter for supervision of normal first pregnancy, second trimester: Secondary | ICD-10-CM

## 2021-10-08 LAB — POCT URINALYSIS DIPSTICK OB
Bilirubin, UA: NEGATIVE
Blood, UA: NEGATIVE
Glucose, UA: NEGATIVE
Ketones, UA: NEGATIVE
Leukocytes, UA: NEGATIVE
Nitrite, UA: NEGATIVE
Spec Grav, UA: 1.015 (ref 1.010–1.025)
Urobilinogen, UA: 0.2 E.U./dL
pH, UA: 6.5 (ref 5.0–8.0)

## 2021-10-08 LAB — OB RESULTS CONSOLE VARICELLA ZOSTER ANTIBODY, IGG: Varicella: NON-IMMUNE/NOT IMMUNE

## 2021-10-08 MED ORDER — NIFEDIPINE ER OSMOTIC RELEASE 30 MG PO TB24: 30.0000 mg | ORAL_TABLET | Freq: Every day | ORAL | 2 refills | Status: DC

## 2021-10-08 MED ORDER — BLOOD PRESSURE KIT DEVI: 1.0000 | Freq: Every day | 0 refills | Status: DC

## 2021-10-08 NOTE — Progress Notes (Signed)
ROB: She is doing well, no new concerns. BTC/TDAP/1 hour glucose done today. ?

## 2021-10-08 NOTE — Progress Notes (Signed)
ROB: Patient has complaints of pain and swelling.  Notes that she did not agree with recent suggestion by nurse from triage who noted that it may be carpal tunnel.  Patient's mother also present for today's visit.  Notes concern about patient's blood pressure not being taken seriously.  I discussed with patient regarding her blood pressure issues and concerns.  Currently blood pressure does not appear to be controlled on current medication (labetalol 100 mg twice daily).  Patient also noting some side effects including dizziness when she takes the medication.  I discussed that we can change her medication to something different, will prescribe Procardia.  Also will prescribe BP cuff so the patient can monitor BPs at home.  Discussed that swelling can be normal in pregnancy however as long as it does not result in edema we could continue to monitor.  Discussed compression stockings and hand braces if swelling becomes significant.  He is performing 3-hour GTT today due to history of early GCT being elevated.  Tdap given, blood consent form signed.  Discussed plans for next growth scan and to begin antenatal testing at 32 weeks.  RTC in 2 weeks. ?

## 2021-10-08 NOTE — Patient Instructions (Signed)
Hypertension During Pregnancy High blood pressure (hypertension) is when the force of blood pumping through the arteries is high enough to cause problems with your health. Arteries are blood vessels that carry blood from the heart throughout the body. Hypertension during pregnancy can cause problems for you and your baby. It can be mild or severe. There are different types of hypertension that can happen during pregnancy. These include: Chronic hypertension. This happens when you had high blood pressure before you became pregnant, and it continues during the pregnancy. Hypertension that develops before you are [redacted] weeks pregnant and continues during the pregnancy is also called chronic hypertension. If you have chronic hypertension, it will not go away after you have your baby. You will need follow-up visits with your health care provider after you have your baby. Your health care provider may want you to keep taking medicine for your blood pressure. Gestational hypertension. This is hypertension that develops after the 20th week of pregnancy. Gestational hypertension usually goes away after you have your baby, but your health care provider will need to monitor your blood pressure to make sure that it is getting better. Postpartum hypertension. This is high blood pressure that was present before delivery and continues after delivery or that starts after delivery. This usually occurs within 48 hours after childbirth but may occur up to 6 weeks after giving birth. When hypertension during pregnancy is severe, it is a medical emergency that requires treatment right away. How does this affect me? Women who have hypertension during pregnancy have a greater chance of developing hypertension later in life or during future pregnancies. In some cases, hypertension during pregnancy can cause serious complications, such as: Stroke. Heart attack. Injury to other organs, such as kidneys, lungs, or  liver. Preeclampsia. A condition called hemolysis, elevated liver enzymes, and low platelet count (HELLP) syndrome. Convulsions or seizures. Placental abruption. How does this affect my baby? Hypertension during pregnancy can affect your baby. Your baby may: Be born early (prematurely). Not weigh as much as he or she should at birth (low birth weight). Not tolerate labor well, leading to an unplanned cesarean delivery. This condition may also result in a baby's death before birth (stillbirth). What are the risks? There are certain factors that make it more likely for you to develop hypertension during pregnancy. These include: Having hypertension during a previous pregnancy or a family history of hypertension. Being overweight. Being age 35 or older. Being pregnant for the first time. Being pregnant with more than one baby. Becoming pregnant using fertilization methods, such as IVF (in vitro fertilization). Having other medical problems, such as diabetes, kidney disease, or lupus. What can I do to lower my risk? The exact cause of hypertension during pregnancy is not known. You may be able to lower your risk by: Maintaining a healthy weight. Eating a healthy and balanced diet. Following your health care provider's instructions about treating any long-term conditions that you had before becoming pregnant. It is very important to keep all of your prenatal care appointments. Your health care provider will check your blood pressure and make sure that your pregnancy is progressing as expected. If a problem is found, early treatment can prevent complications. How is this treated? Treatment for hypertension during pregnancy varies depending on the type of hypertension you have and how serious it is. If you were taking medicine for high blood pressure before you became pregnant, talk with your health care provider. You may need to change medicine during pregnancy because some   medicines, like ACE  inhibitors, may not be considered safe for your baby. If you have gestational hypertension, your health care provider may order medicine to treat this during pregnancy. If you are at risk for preeclampsia, your health care provider may recommend that you take a low-dose aspirin during your pregnancy. If you have severe hypertension, you may need to be hospitalized so you and your baby can be monitored closely. You may also need to be given medicine to lower your blood pressure. In some cases, if your condition gets worse, you may need to deliver your baby early. Follow these instructions at home: Eating and drinking  Drink enough fluid to keep your urine pale yellow. Avoid caffeine. Lifestyle Do not use any products that contain nicotine or tobacco. These products include cigarettes, chewing tobacco, and vaping devices, such as e-cigarettes. If you need help quitting, ask your health care provider. Do not use alcohol or drugs. Avoid stress as much as possible. Rest and get plenty of sleep. Regular exercise can help to reduce your blood pressure. Ask your health care provider what kinds of exercise are best for you. General instructions Take over-the-counter and prescription medicines only as told by your health care provider. Keep all prenatal and follow-up visits. This is important. Contact a health care provider if: You have symptoms that your health care provider told you may require more treatment or monitoring, such as: Headaches. Nausea or vomiting. Abdominal pain. Dizziness. Light-headedness. Get help right away if: You have symptoms of serious complications, such as: Severe abdominal pain that does not get better with treatment. A severe headache that does not get better, blurred vision, or double vision. Vomiting that does not get better. Sudden, rapid weight gain or swelling in your hands, ankles, or face. Vaginal bleeding. Blood in your urine. Shortness of breath or chest  pain. Weakness on one side of your body or difficulty speaking. Your baby is not moving as much as usual. These symptoms may represent a serious problem that is an emergency. Do not wait to see if the symptoms will go away. Get medical help right away. Call your local emergency services (911 in the [place] Do not drive yourself to the hospital. Summary Hypertension during pregnancy can cause problems for you and your baby. Treatment for hypertension during pregnancy varies depending on the type of hypertension you have and how serious it is. Keep all prenatal and follow-up visits. This is important. Get help right away if you have symptoms of serious complications related to high blood pressure. This information is not intended to replace advice given to you by your health care provider. Make sure you discuss any questions you have with your health care provider. Document Revised: 04/11/2020 Document Reviewed: 04/11/2020 Elsevier Patient Education  2022 [employer]  

## 2021-10-09 LAB — CBC
Hematocrit: 35.2 % (ref 34.0–46.6)
Hemoglobin: 11.4 g/dL (ref 11.1–15.9)
MCH: 27.1 pg (ref 26.6–33.0)
MCHC: 32.4 g/dL (ref 31.5–35.7)
MCV: 84 fL (ref 79–97)
Platelets: 377 10*3/uL (ref 150–450)
RBC: 4.21 x10E6/uL (ref 3.77–5.28)
RDW: 13.8 % (ref 11.7–15.4)
WBC: 15.1 10*3/uL — ABNORMAL HIGH (ref 3.4–10.8)

## 2021-10-09 LAB — GESTATIONAL GLUCOSE TOLERANCE
Glucose, Fasting: 100 mg/dL — ABNORMAL HIGH (ref 70–94)
Glucose, GTT - 1 Hour: 157 mg/dL (ref 70–179)
Glucose, GTT - 2 Hour: 167 mg/dL — ABNORMAL HIGH (ref 70–154)
Glucose, GTT - 3 Hour: 148 mg/dL — ABNORMAL HIGH (ref 70–139)

## 2021-10-09 LAB — VARICELLA ZOSTER ANTIBODY, IGG: Varicella zoster IgG: <135 index — ABNORMAL LOW (ref >165)

## 2021-10-09 LAB — RUBELLA SCREEN: Rubella Antibodies, IGG: 1.45 index (ref >0.99)

## 2021-10-09 LAB — TSH: TSH: 5.79 u[IU]/mL — ABNORMAL HIGH (ref 0.450–4.500)

## 2021-10-09 LAB — RPR: RPR Ser Ql: NONREACTIVE

## 2021-10-09 LAB — HEPATITIS B SURFACE ANTIGEN: Hepatitis B Surface Ag: NEGATIVE

## 2021-10-09 LAB — HIV ANTIBODY (ROUTINE TESTING W REFLEX): HIV Screen 4th Generation wRfx: NONREACTIVE

## 2021-10-09 LAB — HEPATITIS C ANTIBODY: Hep C Virus Ab: NONREACTIVE

## 2021-10-10 ENCOUNTER — Encounter: Payer: Self-pay | Admitting: Obstetrics and Gynecology

## 2021-10-10 DIAGNOSIS — O24419 Gestational diabetes mellitus in pregnancy, unspecified control: Secondary | ICD-10-CM | POA: Insufficient documentation

## 2021-10-10 DIAGNOSIS — Z2839 Other underimmunization status: Secondary | ICD-10-CM | POA: Insufficient documentation

## 2021-10-10 DIAGNOSIS — O09899 Supervision of other high risk pregnancies, unspecified trimester: Secondary | ICD-10-CM | POA: Insufficient documentation

## 2021-10-10 LAB — CULTURE, OB URINE

## 2021-10-10 LAB — URINE CULTURE, OB REFLEX

## 2021-10-10 NOTE — Addendum Note (Signed)
Addended by: Fabian November on: 10/10/2021 09:38 AM ? ? Modules accepted: Orders ? ?

## 2021-10-13 LAB — PAIN MGT SCRN (14 DRUGS), UR
Amphetamine Scrn, Ur: NEGATIVE ng/mL
BARBITURATE SCREEN URINE: NEGATIVE ng/mL
BENZODIAZEPINE SCREEN, URINE: NEGATIVE ng/mL
Buprenorphine, Urine: NEGATIVE ng/mL
CANNABINOIDS UR QL SCN: NEGATIVE ng/mL
Cocaine (Metab) Scrn, Ur: NEGATIVE ng/mL
Creatinine(Crt), U: 111.3 mg/dL (ref 20.0–300.0)
Fentanyl, Urine: NEGATIVE pg/mL
Meperidine Screen, Urine: NEGATIVE ng/mL
Methadone Screen, Urine: NEGATIVE ng/mL
OXYCODONE+OXYMORPHONE UR QL SCN: NEGATIVE ng/mL
Opiate Scrn, Ur: NEGATIVE ng/mL
Ph of Urine: 6.5 (ref 4.5–8.9)
Phencyclidine Qn, Ur: NEGATIVE ng/mL
Propoxyphene Scrn, Ur: NEGATIVE ng/mL
Tramadol Screen, Urine: NEGATIVE ng/mL

## 2021-10-15 ENCOUNTER — Observation Stay
Admission: EM | Admit: 2021-10-15 | Discharge: 2021-10-16 | Disposition: A | Discharge status: Home / Self Care | Payer: Medicaid Other | Attending: Obstetrics and Gynecology | Admitting: Obstetrics and Gynecology

## 2021-10-15 DIAGNOSIS — Z3A29 29 weeks gestation of pregnancy: Secondary | ICD-10-CM | POA: Insufficient documentation

## 2021-10-15 DIAGNOSIS — O24410 Gestational diabetes mellitus in pregnancy, diet controlled: Secondary | ICD-10-CM

## 2021-10-15 DIAGNOSIS — Z3490 Encounter for supervision of normal pregnancy, unspecified, unspecified trimester: Secondary | ICD-10-CM

## 2021-10-15 DIAGNOSIS — O163 Unspecified maternal hypertension, third trimester: Principal | ICD-10-CM | POA: Insufficient documentation

## 2021-10-15 HISTORY — DX: Gestational diabetes mellitus in pregnancy, unspecified control: O24.419

## 2021-10-15 NOTE — Addendum Note (Signed)
Addended by: Tommie Raymond on: 10/15/2021 11:29 AM ? ? Modules accepted: Orders ? ?

## 2021-10-16 ENCOUNTER — Other Ambulatory Visit: Payer: Self-pay

## 2021-10-16 ENCOUNTER — Encounter: Payer: Self-pay | Admitting: Obstetrics and Gynecology

## 2021-10-16 DIAGNOSIS — R519 Headache, unspecified: Secondary | ICD-10-CM

## 2021-10-16 DIAGNOSIS — Z3A29 29 weeks gestation of pregnancy: Secondary | ICD-10-CM

## 2021-10-16 DIAGNOSIS — O26893 Other specified pregnancy related conditions, third trimester: Secondary | ICD-10-CM

## 2021-10-16 DIAGNOSIS — O163 Unspecified maternal hypertension, third trimester: Secondary | ICD-10-CM | POA: Diagnosis not present

## 2021-10-16 MED ORDER — OXYCODONE-ACETAMINOPHEN 5-325 MG PO TABS
2.0000 | ORAL_TABLET | Freq: Once | ORAL | Status: AC
  Administered 2021-10-16: 2 via ORAL
  Filled 2021-10-16: qty 2

## 2021-10-16 NOTE — OB Triage Note (Addendum)
Discharge instructions provided to pt. Pt verbalizes understanding. Vaginal bleeding and discharge, contractions, and fetal movement reviewed by RN. Red flag PreE symptoms reviewed, educated on taking blood pressure at home. Follow-up care reviewed. Pt discharged home with mother.  ?

## 2021-10-16 NOTE — OB Triage Note (Signed)
Pt is a 25yo G1, 29w 1d. She arrived to the unit with complaints of  elevated blood pressures and headache. She stated that she has had a 10/10 headache throughout the day that has not resolved with tylenol. She states that her last dose of tylenol taken was 500mg  at 1830. The pt states that she works at [employer] and took her blood pressure with the machine there which read 150's/110's around 2200.  ?She denies vaginal bleeding, reports positive fetal movement. VS stable, monitors applied and assessing. ?  ?Initial FHT 130 at 2352.   ?

## 2021-10-16 NOTE — Discharge Summary (Signed)
? ? ?  L&D OB Triage Note ? ?SUBJECTIVE ?Lori EDMUNDS is a 26 y.o. G1P0 female at [redacted]w[redacted]d, EDD Estimated Date of Delivery: 12/31/21 who presented to triage with complaints of headache and elevated blood pressure which she took at [employer].  ? ?OB History  ?Gravida Para Term Preterm AB Living  ?1 0 0 0 0 0  ?SAB IAB Ectopic Multiple Live Births  ?0 0 0 0 0  ?  ?# Outcome Date GA Lbr Len/2nd Weight Sex Delivery Anes PTL Lv  ?1 Current           ? ? ?No medications prior to admission.  ? ? ? OBJECTIVE ? Nursing Evaluation: ?  BP (!) 116/54   Pulse 76   Temp 97.9 ?F (36.6 ?C) (Oral)   Ht 5\' 5"  (1.651 m)   Wt 127 kg   LMP  (LMP Unknown)   BMI 46.59 kg/m?  ?  Findings:    ?    Patient had multiple blood pressure taking during her stay and none were abnormal. ?    As patient had a severe headache -headache treated with narcotics 1 dose. ?    Reassuring fetal heart rate tracing ?    ? ?NST was performed and has been reviewed by me. ? ?NST INTERPRETATION: ?Category I ? ?Mode: External ?Baseline Rate (A): 125 bpm ?Variability: Moderate ?Accelerations: 15 x 15 ?Decelerations: None ?  ?  ?Contraction Frequency (min): none noted ? ?ASSESSMENT ?Impression:  ?1.  Pregnancy:  G1P0 at [redacted]w[redacted]d , EDD Estimated Date of Delivery: 12/31/21 ?2.  Reassuring fetal and maternal status ?3.  Headache ?4.  No evidence of hypertension ? ?PLAN ?1. Current condition and above findings reviewed.  Reassuring fetal and maternal condition. ?2. Discharge home with standard labor precautions given to return to L&D or call the office for problems. ?3. Continue routine prenatal care. ?4.  To contact the office if headache returns or if her blood pressure is again elevated.  Expect follow-up next week otherwise. ?    ?

## 2021-10-21 ENCOUNTER — Encounter: Payer: Self-pay | Admitting: *Deleted

## 2021-10-21 ENCOUNTER — Other Ambulatory Visit: Payer: Self-pay

## 2021-10-21 ENCOUNTER — Encounter: Payer: Medicaid Other | Attending: Obstetrics and Gynecology | Admitting: *Deleted

## 2021-10-21 VITALS — Ht 65.0 in | Wt 275.7 lb

## 2021-10-21 DIAGNOSIS — Z3A29 29 weeks gestation of pregnancy: Secondary | ICD-10-CM | POA: Insufficient documentation

## 2021-10-21 DIAGNOSIS — O24419 Gestational diabetes mellitus in pregnancy, unspecified control: Secondary | ICD-10-CM | POA: Insufficient documentation

## 2021-10-21 DIAGNOSIS — Z3A00 Weeks of gestation of pregnancy not specified: Secondary | ICD-10-CM | POA: Diagnosis not present

## 2021-10-21 DIAGNOSIS — Z713 Dietary counseling and surveillance: Secondary | ICD-10-CM | POA: Diagnosis not present

## 2021-10-21 DIAGNOSIS — O24410 Gestational diabetes mellitus in pregnancy, diet controlled: Secondary | ICD-10-CM | POA: Diagnosis not present

## 2021-10-21 NOTE — Patient Instructions (Signed)
Read booklet on Gestational Diabetes ?Follow Gestational Meal Planning Guidelines ?Don't skip  meals - eat at least 1 protein and 1 carbohydrate serving ?Don't use saccharin as artificial sweetener and limit other artificial sweeteners to 1-2 servings/day ?Complete a 3 Day Food Record and bring to next appointment ?Check blood sugars 4 x day - before breakfast and 2 hrs after every meal and record  ?Bring blood sugar log to all appointments ?Call MD for prescription for meter strips and lancets ?Strips   Accu-Chek Guide  Lancets   Accu-Chek Softclix ?Purchase urine ketone strips if instructed by MD and check urine ketones every am:  If + increase bedtime snack to 1 protein and 2 carbohydrate servings ?Walk 20-30 minutes at least 5 x week if permitted by MD ? ?

## 2021-10-21 NOTE — Progress Notes (Signed)
Diabetes Self-Management Education ? ?Visit Type: First/Initial ? ?Appt. Start Time: 1315 Appt. End Time: 1445 ? ?10/21/2021 ? ?[name], identified by name and date of birth, is a 26 y.o. female with a diagnosis of Diabetes: Gestational Diabetes.  ? ?ASSESSMENT ? ?Blood pressure (P) 126/90, height 5\' 5"  (1.651 m), weight 275 lb 11.2 oz (125.1 kg), estimated date of delivery 12/31/2021 ?Body mass index is 45.88 kg/m?. ? ? Diabetes Self-Management Education - 10/21/21 1514   ? ?  ? Visit Information  ? Visit Type First/Initial   ?  ? Initial Visit  ? Diabetes Type Gestational Diabetes   ? Are you currently following a meal plan? No   ? Are you taking your medications as prescribed? Yes   ? Date Diagnosed 1 week   ?  ? Health Coping  ? How would you rate your overall health? Fair   ?  ? Psychosocial Assessment  ? Patient Belief/Attitude about Diabetes Other (comment)   "sad"  ? Self-care barriers None   ? Self-management support Doctor's office;Family   ? Patient Concerns Nutrition/Meal planning;Glycemic Control;Weight Control;Monitoring;Healthy Lifestyle   ? Special Needs None   ? Preferred Learning Style Auditory;Visual;Hands on   ? Learning Readiness Change in progress   ? How often do you need to have someone help you when you read instructions, pamphlets, or other written materials from your doctor or pharmacy? 1 - Never   ? What is the last grade level you completed in school? high school   ?  ? Pre-Education Assessment  ? Patient understands the diabetes disease and treatment process. Needs Instruction   ? Patient understands incorporating nutritional management into lifestyle. Needs Instruction   ? Patient undertands incorporating physical activity into lifestyle. Needs Review   ? Patient understands using medications safely. Needs Instruction   ? Patient understands monitoring blood glucose, interpreting and using results Needs Instruction   ? Patient understands prevention, detection, and  treatment of acute complications. Needs Instruction   ? Patient understands prevention, detection, and treatment of chronic complications. Needs Instruction   ? Patient understands how to develop strategies to address psychosocial issues. Needs Instruction   ? Patient understands how to develop strategies to promote health/change behavior. Needs Instruction   ?  ? Complications  ? How often do you check your blood sugar? 0 times/day (not testing)   Provided Accu-Chek Guide Me meter and instructed on use. BG upon return demonstration was 84 mg/dL at 10/23/21 pm - fasting.  ? Have you had a dilated eye exam in the past 12 months? No   ? Have you had a dental exam in the past 12 months? No   ? Are you checking your feet? No   ?  ? Dietary Intake  ? Breakfast skips - "sleep through it"   ? Lunch 11 am - wheat bread wtih cheese, slamai, mayo; carrots, fruit (strawberries, cantaloup, grapes)   ? Snack (afternoon) 7 pm - beef stick, cheese   ? Dinner meatloaf, stea, hot wings, grilled salmon; green beans, black beans, rice, pasta; salad with lettuce, tomato, cuccumbers, broccoli, cauliflower   ? Beverage(s) water, lemonade (Crystal light), Pepsi zero   ?  ? Exercise  ? Exercise Type Light (walking / raking leaves)   ? How many days per week to you exercise? 7   ? How many minutes per day do you exercise? 180   ? Total minutes per week of exercise 1260   ?  ? Patient Education  ?  Previous Diabetes Education No   ? Disease state  Definition of diabetes, type 1 and 2, and the diagnosis of diabetes;Factors that contribute to the development of diabetes   ? Nutrition management  Role of diet in the treatment of diabetes and the relationship between the three main macronutrients and blood glucose level;Food label reading, portion sizes and measuring food.;Reviewed blood glucose goals for pre and post meals and how to evaluate the patients' food intake on their blood glucose level; Instructed her not to eat keto diet while pregnant.   ? Physical activity and exercise  Role of exercise on diabetes management, blood pressure control and cardiac health.   ? Medications Other (comment)   Limited use of oral medications during pregnancy and potential for insulin.  ? Monitoring Taught/evaluated SMBG meter.;Purpose and frequency of SMBG.;Taught/discussed recording of test results and interpretation of SMBG.;Identified appropriate SMBG and/or A1C goals.;Ketone testing, when, how.   ? Chronic complications Relationship between chronic complications and blood glucose control   ? Psychosocial adjustment Identified and addressed patients feelings and concerns about diabetes   ? Preconception care Pregnancy and GDM  Role of pre-pregnancy blood glucose control on the development of the fetus;Reviewed with patient blood glucose goals with pregnancy;Role of family planning for patients with diabetes   ?  ? Individualized Goals (developed by patient)  ? Reducing Risk Other (comment)   improve blood sugars, prevent diabetes complications, lose weight, lead a healthier lifestyle, become more fit  ?  ? Outcomes  ? Expected Outcomes Demonstrated interest in learning. Expect positive outcomes   ? Future DMSE 2 wks   ? ?  ?  ?Individualized Plan for Diabetes Self-Management Training:  ? ?Learning Objective:  Patient will have a greater understanding of diabetes self-management. ?Patient education plan is to attend individual and/or group sessions per assessed needs and concerns. ?  ?Plan:  ? ?Patient Instructions  ?Read booklet on Gestational Diabetes ?Follow Gestational Meal Planning Guidelines ?Don't skip  meals - eat at least 1 protein and 1 carbohydrate serving ?Don't use saccharin as artificial sweetener and limit other artificial sweeteners to 1-2 servings/day ?Complete a 3 Day Food Record and bring to next appointment ?Check blood sugars 4 x day - before breakfast and 2 hrs after every meal and record  ?Bring blood sugar log to all appointments ?Call MD for  prescription for meter strips and lancets ?Strips   Accu-Chek Guide  Lancets   Accu-Chek Softclix ?Purchase urine ketone strips if instructed by MD and check urine ketones every am:  If + increase bedtime snack to 1 protein and 2 carbohydrate servings ?Walk 20-30 minutes at least 5 x week if permitted by MD ? ?Expected Outcomes:  Demonstrated interest in learning. Expect positive outcomes ? ?Education material provided:  ?Gestational Booklet ?Gestational Meal Planning Guidelines ?Simple Meal Plan ?Viewed Gestational Diabetes Video ?Meter = Accu-Chek Guide Me ?3 Day Food Record ?Goals for a Healthy Pregnancy ?  ?If problems or questions, patient to contact team via:   ?Sharion Settler, RN, CCM, CDCES [phone] ? ?Future DSME appointment: 2 wks ?November 03, 2021 with the dietitian ?

## 2021-10-22 ENCOUNTER — Encounter: Payer: Self-pay | Admitting: Obstetrics and Gynecology

## 2021-10-22 ENCOUNTER — Ambulatory Visit (INDEPENDENT_AMBULATORY_CARE_PROVIDER_SITE_OTHER): Payer: Medicaid Other | Admitting: Obstetrics and Gynecology

## 2021-10-22 VITALS — BP 135/88 | HR 83

## 2021-10-22 DIAGNOSIS — Z3A30 30 weeks gestation of pregnancy: Secondary | ICD-10-CM

## 2021-10-22 DIAGNOSIS — O24410 Gestational diabetes mellitus in pregnancy, diet controlled: Secondary | ICD-10-CM

## 2021-10-22 DIAGNOSIS — O10912 Unspecified pre-existing hypertension complicating pregnancy, second trimester: Secondary | ICD-10-CM

## 2021-10-22 DIAGNOSIS — O0993 Supervision of high risk pregnancy, unspecified, third trimester: Secondary | ICD-10-CM

## 2021-10-22 DIAGNOSIS — O99210 Obesity complicating pregnancy, unspecified trimester: Secondary | ICD-10-CM

## 2021-10-22 LAB — POCT URINALYSIS DIPSTICK OB
Bilirubin, UA: NEGATIVE
Glucose, UA: NEGATIVE
Nitrite, UA: NEGATIVE
Spec Grav, UA: 1.015 (ref 1.010–1.025)
Urobilinogen, UA: 0.2 E.U./dL
pH, UA: 6.5 (ref 5.0–8.0)

## 2021-10-22 NOTE — Patient Instructions (Signed)

## 2021-10-22 NOTE — Progress Notes (Signed)
ROB: She is doing well, no new concerns today. ?

## 2021-10-22 NOTE — Progress Notes (Signed)
ROB: Doing well, no major issues.  No side effects from Procardia, doing well.  Recently attended [facility] class, just started checking blood sugars yesterday, appear normal so far.  Advised to bring log to next visit. For growth scan in 2 weeks.  ?

## 2021-10-27 ENCOUNTER — Encounter: Payer: Self-pay | Admitting: Obstetrics and Gynecology

## 2021-10-27 ENCOUNTER — Other Ambulatory Visit: Payer: Self-pay | Admitting: Obstetrics and Gynecology

## 2021-10-27 DIAGNOSIS — O24410 Gestational diabetes mellitus in pregnancy, diet controlled: Secondary | ICD-10-CM

## 2021-10-27 MED ORDER — BLOOD PRESSURE KIT DEVI: 1.0000 | Freq: Every day | 0 refills | Status: DC

## 2021-10-27 MED ORDER — BLOOD GLUCOSE TEST VI STRP: 100.0000 | ORAL_STRIP | Freq: Every day | 1 refills | Status: DC

## 2021-10-29 ENCOUNTER — Encounter: Payer: Self-pay | Admitting: Obstetrics and Gynecology

## 2021-10-29 ENCOUNTER — Telehealth: Payer: Self-pay | Admitting: Obstetrics and Gynecology

## 2021-10-29 ENCOUNTER — Observation Stay
Admission: EM | Admit: 2021-10-29 | Discharge: 2021-10-29 | Disposition: A | Discharge status: Home / Self Care | Payer: Medicaid Other | Attending: Obstetrics and Gynecology | Admitting: Obstetrics and Gynecology

## 2021-10-29 DIAGNOSIS — O26893 Other specified pregnancy related conditions, third trimester: Secondary | ICD-10-CM | POA: Diagnosis not present

## 2021-10-29 DIAGNOSIS — O218 Other vomiting complicating pregnancy: Secondary | ICD-10-CM | POA: Insufficient documentation

## 2021-10-29 DIAGNOSIS — Z3A31 31 weeks gestation of pregnancy: Secondary | ICD-10-CM | POA: Insufficient documentation

## 2021-10-29 DIAGNOSIS — R1011 Right upper quadrant pain: Secondary | ICD-10-CM | POA: Diagnosis not present

## 2021-10-29 DIAGNOSIS — K920 Hematemesis: Secondary | ICD-10-CM | POA: Insufficient documentation

## 2021-10-29 DIAGNOSIS — R519 Headache, unspecified: Secondary | ICD-10-CM | POA: Diagnosis not present

## 2021-10-29 LAB — COMPREHENSIVE METABOLIC PANEL
ALT: 96 U/L — ABNORMAL HIGH (ref 0–44)
AST: 42 U/L — ABNORMAL HIGH (ref 15–41)
Albumin: 3.3 g/dL — ABNORMAL LOW (ref 3.5–5.0)
Alkaline Phosphatase: 104 U/L (ref 38–126)
Anion gap: 8 (ref 5–15)
BUN: 9 mg/dL (ref 6–20)
CO2: 21 mmol/L — ABNORMAL LOW (ref 22–32)
Calcium: 9.4 mg/dL (ref 8.9–10.3)
Chloride: 107 mmol/L (ref 98–111)
Creatinine, Ser: 0.41 mg/dL — ABNORMAL LOW (ref 0.44–1.00)
GFR, Estimated: >60 mL/min (ref >60)
Glucose, Bld: 110 mg/dL — ABNORMAL HIGH (ref 70–99)
Potassium: 4.1 mmol/L (ref 3.5–5.1)
Sodium: 136 mmol/L (ref 135–145)
Total Bilirubin: 0.4 mg/dL (ref 0.3–1.2)
Total Protein: 7.4 g/dL (ref 6.5–8.1)

## 2021-10-29 LAB — URINALYSIS, COMPLETE (UACMP) WITH MICROSCOPIC
Bacteria, UA: NONE SEEN
Bilirubin Urine: NEGATIVE
Glucose, UA: NEGATIVE mg/dL
Hgb urine dipstick: NEGATIVE
Ketones, ur: 5 mg/dL — AB
Nitrite: NEGATIVE
Protein, ur: NEGATIVE mg/dL
Specific Gravity, Urine: 1.017 (ref 1.005–1.030)
pH: 6 (ref 5.0–8.0)

## 2021-10-29 LAB — CBC WITH DIFFERENTIAL/PLATELET
Abs Immature Granulocytes: 0.1 10*3/uL — ABNORMAL HIGH (ref 0.00–0.07)
Basophils Absolute: 0.1 10*3/uL (ref 0.0–0.1)
Basophils Relative: 0 %
Eosinophils Absolute: 0.1 10*3/uL (ref 0.0–0.5)
Eosinophils Relative: 1 %
HCT: 36.4 % (ref 36.0–46.0)
Hemoglobin: 11.8 g/dL — ABNORMAL LOW (ref 12.0–15.0)
Immature Granulocytes: 1 %
Lymphocytes Relative: 14 %
Lymphs Abs: 2.1 10*3/uL (ref 0.7–4.0)
MCH: 26.8 pg (ref 26.0–34.0)
MCHC: 32.4 g/dL (ref 30.0–36.0)
MCV: 82.5 fL (ref 80.0–100.0)
Monocytes Absolute: 0.7 10*3/uL (ref 0.1–1.0)
Monocytes Relative: 5 %
Neutro Abs: 11.8 10*3/uL — ABNORMAL HIGH (ref 1.7–7.7)
Neutrophils Relative %: 79 %
Platelets: 362 10*3/uL (ref 150–400)
RBC: 4.41 MIL/uL (ref 3.87–5.11)
RDW: 13.8 % (ref 11.5–15.5)
WBC: 14.8 10*3/uL — ABNORMAL HIGH (ref 4.0–10.5)
nRBC: 0 % (ref 0.0–0.2)

## 2021-10-29 LAB — PROTEIN / CREATININE RATIO, URINE
Creatinine, Urine: 135 mg/dL
Protein Creatinine Ratio: 0.1 mg/mg{Cre} (ref 0.00–0.15)
Total Protein, Urine: 14 mg/dL

## 2021-10-29 MED ORDER — ACETAMINOPHEN 500 MG PO TABS
ORAL_TABLET | ORAL | Status: AC
  Filled 2021-10-29: qty 2

## 2021-10-29 MED ORDER — ACETAMINOPHEN 500 MG PO TABS
1000.0000 mg | ORAL_TABLET | Freq: Once | ORAL | Status: AC
  Administered 2021-10-29: 1000 mg via ORAL

## 2021-10-29 NOTE — OB Triage Note (Signed)
Patient Discharged home per provider. Pt educated about labor precautions and informed when to return to the ED for further evaluation. Pt instructed to keep all follow up appointments with her provider. AVS given to patient and RN answered all questions and patient has no further questions at this time. Pt discharged home in stable condition with mother.  ?

## 2021-10-29 NOTE — OB Triage Note (Signed)
Pt is a G1P0 at [redacted]w[redacted]d that presents from ED with c/o Vomiting blood and right side pain. Pt states she woke up this am around 0430 with right side pain that starts under her breast and continues down to her right pelvis. Pt describes the pain as sharp and burning pain that is constant. She states that she threw up dark red blood right after waking up and has had two more episodes since then. Pt states she took zofran this am and has not had anymore emesis since then. She endorses a constant headache for the past 3 days and has not tried any medication to relieve the headache only food and caffeine which were not effective. Pt denies VB, LOF and states positive FM.  ?

## 2021-10-29 NOTE — Telephone Encounter (Signed)
Called patient to schedule a Mychart video visit to discuss concerns. No answer left voicemail with patient to contact office to schedule a visit.  ?

## 2021-10-29 NOTE — Discharge Summary (Signed)
? ? ?  L&D OB Triage Note ? ?SUBJECTIVE ?Lori LANTAGNE is a 26 y.o. G1P0 female at [redacted]w[redacted]d, EDD Estimated Date of Delivery: 12/31/21 who presented to triage with complaints of right upper quadrant pain, vomiting blood (small flecks) and headache.  ? ?OB History  ?Gravida Para Term Preterm AB Living  ?1 0 0 0 0 0  ?SAB IAB Ectopic Multiple Live Births  ?0 0 0 0 0  ?  ?# Outcome Date GA Lbr Len/2nd Weight Sex Delivery Anes PTL Lv  ?1 Current           ? ? ?No medications prior to admission.  ? ? ? OBJECTIVE ? Nursing Evaluation: ?  BP 139/87 (BP Location: Left Arm)   Pulse 79   Temp 98.3 ?F (36.8 ?C) (Oral)   Resp 20   Ht 5\' 5"  (1.651 m)   Wt 127 kg   LMP  (LMP Unknown)   BMI 46.59 kg/m?  ?  Findings:    ?    Patient does not have preeclampsia or HELLP ?    Liver enzymes mildly elevated but this has happened previously.  Recent hepatitis B and hepatitis C negative. ?    ? ?NST was performed and has been reviewed by me. ? ?NST INTERPRETATION: ?Category I ? ?Mode: External ?Baseline Rate (A): 130 bpm ?Variability: Moderate ?Accelerations: 15 x 15 ?Decelerations: None ?  ?  ?Contraction Frequency (min): none ? ?ASSESSMENT ?Impression:  ?1.  Pregnancy:  G1P0 at [redacted]w[redacted]d , EDD Estimated Date of Delivery: 12/31/21 ?2.  Reassuring fetal and maternal status ? ? ?PLAN ?1. Current condition and above findings reviewed.  Reassuring fetal and maternal condition. ?2. Discharge home with standard labor precautions given to return to L&D or call the office for problems. ?3. Continue routine prenatal care. ?    ?

## 2021-10-30 MED ORDER — BUTALBITAL-APAP-CAFFEINE 50-325-40 MG PO CAPS: 1.0000 | ORAL_CAPSULE | Freq: Four times a day (QID) | ORAL | 0 refills | Status: DC | PRN

## 2021-11-03 ENCOUNTER — Encounter: Payer: Self-pay | Admitting: Dietician

## 2021-11-03 ENCOUNTER — Encounter: Payer: Medicaid Other | Attending: Obstetrics and Gynecology | Admitting: Dietician

## 2021-11-03 VITALS — BP 130/90 | Ht 65.0 in | Wt 275.3 lb

## 2021-11-03 DIAGNOSIS — O24419 Gestational diabetes mellitus in pregnancy, unspecified control: Secondary | ICD-10-CM | POA: Insufficient documentation

## 2021-11-03 DIAGNOSIS — O24410 Gestational diabetes mellitus in pregnancy, diet controlled: Secondary | ICD-10-CM

## 2021-11-03 DIAGNOSIS — Z713 Dietary counseling and surveillance: Secondary | ICD-10-CM | POA: Diagnosis not present

## 2021-11-03 DIAGNOSIS — Z3A30 30 weeks gestation of pregnancy: Secondary | ICD-10-CM | POA: Insufficient documentation

## 2021-11-03 NOTE — Patient Instructions (Signed)
Continue to eat healthy food choices, great job! ?Allow for up to 45grams of carbs with each meal, especially during the day when active.  ?

## 2021-11-03 NOTE — Progress Notes (Signed)
Patient's BG record indicates fasting BGs ranging 84-113; several readings likely not true fasting results due to late supper meals , and post-meal BGs ranging 96-123. ?Patient denies any hypoglycemic episodes or symptoms. ?Patient's food recall indicates generally healthy food choices, controlled carb intake, with possibly some low carb meals. Protein choices are limited to meats and cheeses, as she does not eat eggs or nuts/ nut butters.  ?Provided basic balanced meal plan, and wrote individualized menus based on patient's food preferences. ?Instructed patient on food safety, including avoidance of Listeriosis, and limiting mercury from fish. ?Discussed importance of maintaining healthy lifestyle habits to reduce risk of Type 2 DM as well as Gestational DM with any future pregnancies. ?Advised patient to use any remaining testing supplies to test some BGs after delivery, and to have BG tested ideally annually, as well as prior to attempting future pregnancies. ? ?

## 2021-11-05 ENCOUNTER — Ambulatory Visit (INDEPENDENT_AMBULATORY_CARE_PROVIDER_SITE_OTHER): Payer: Medicaid Other | Admitting: Obstetrics and Gynecology

## 2021-11-05 ENCOUNTER — Encounter: Payer: Self-pay | Admitting: Obstetrics and Gynecology

## 2021-11-05 ENCOUNTER — Ambulatory Visit (INDEPENDENT_AMBULATORY_CARE_PROVIDER_SITE_OTHER): Payer: Medicaid Other

## 2021-11-05 VITALS — BP 134/76 | HR 87 | Wt 274.4 lb

## 2021-11-05 DIAGNOSIS — O10912 Unspecified pre-existing hypertension complicating pregnancy, second trimester: Secondary | ICD-10-CM

## 2021-11-05 DIAGNOSIS — O99210 Obesity complicating pregnancy, unspecified trimester: Secondary | ICD-10-CM

## 2021-11-05 DIAGNOSIS — O99213 Obesity complicating pregnancy, third trimester: Secondary | ICD-10-CM

## 2021-11-05 DIAGNOSIS — Z3A32 32 weeks gestation of pregnancy: Secondary | ICD-10-CM

## 2021-11-05 DIAGNOSIS — O10913 Unspecified pre-existing hypertension complicating pregnancy, third trimester: Secondary | ICD-10-CM | POA: Diagnosis not present

## 2021-11-05 DIAGNOSIS — O0993 Supervision of high risk pregnancy, unspecified, third trimester: Secondary | ICD-10-CM

## 2021-11-05 DIAGNOSIS — E669 Obesity, unspecified: Secondary | ICD-10-CM

## 2021-11-05 DIAGNOSIS — O24410 Gestational diabetes mellitus in pregnancy, diet controlled: Secondary | ICD-10-CM

## 2021-11-05 DIAGNOSIS — Z3A30 30 weeks gestation of pregnancy: Secondary | ICD-10-CM

## 2021-11-05 LAB — POCT URINALYSIS DIPSTICK OB
Bilirubin, UA: NEGATIVE
Blood, UA: NEGATIVE
Glucose, UA: NEGATIVE
Ketones, UA: POSITIVE
Leukocytes, UA: NEGATIVE
Nitrite, UA: NEGATIVE
Spec Grav, UA: 1.01 (ref 1.010–1.025)
Urobilinogen, UA: 0.2 E.U./dL
pH, UA: 7 (ref 5.0–8.0)

## 2021-11-05 NOTE — Progress Notes (Signed)
ROB: She is doing well, no new concerns today. ?

## 2021-11-05 NOTE — Progress Notes (Signed)
ROB: Doing well.  Was [facility] for f/u, notes blood sugars well controlled.  Has concerns as she is losing some weight. Advised that this was normal due to her diet improving.  Also has concerns about today's [place], notes fetus measuring 2 weeks behind. Reassured patient, overall growth is still normal. Will f/u in 4 weeks with repeat growth scan. To begin antenatal testing, will schedule weekly. Discussed IOL at 38-39 weeks unless otherwise indicated.  ? ? ?

## 2021-11-12 ENCOUNTER — Ambulatory Visit (INDEPENDENT_AMBULATORY_CARE_PROVIDER_SITE_OTHER): Payer: Medicaid Other | Admitting: Obstetrics and Gynecology

## 2021-11-12 DIAGNOSIS — Z3A33 33 weeks gestation of pregnancy: Secondary | ICD-10-CM

## 2021-11-12 DIAGNOSIS — I10 Essential (primary) hypertension: Secondary | ICD-10-CM

## 2021-11-12 DIAGNOSIS — O26892 Other specified pregnancy related conditions, second trimester: Secondary | ICD-10-CM

## 2021-11-12 DIAGNOSIS — O24410 Gestational diabetes mellitus in pregnancy, diet controlled: Secondary | ICD-10-CM | POA: Diagnosis not present

## 2021-11-12 DIAGNOSIS — O10912 Unspecified pre-existing hypertension complicating pregnancy, second trimester: Secondary | ICD-10-CM | POA: Diagnosis not present

## 2021-11-12 DIAGNOSIS — R109 Unspecified abdominal pain: Secondary | ICD-10-CM

## 2021-11-12 DIAGNOSIS — O99210 Obesity complicating pregnancy, unspecified trimester: Secondary | ICD-10-CM

## 2021-11-12 LAB — FETAL NONSTRESS TEST

## 2021-11-12 NOTE — Progress Notes (Signed)
NONSTRESS TEST INTERPRETATION ? ?INDICATIONS: GDM (diet), Chronic hypertension (on Procardia), and Obesity ? ?FHR baseline: 150 bpm ?RESULTS:Reactive ?COMMENTS: no contractions ? ? ?PLAN: ?1. Continue fetal kick counts twice a day. ?2. Continue antepartum testing as scheduled-weekly ? ? ?

## 2021-11-19 ENCOUNTER — Other Ambulatory Visit: Payer: Medicaid Other

## 2021-11-19 ENCOUNTER — Encounter: Payer: Medicaid Other | Admitting: Obstetrics and Gynecology

## 2021-11-20 ENCOUNTER — Other Ambulatory Visit: Payer: Self-pay

## 2021-11-20 ENCOUNTER — Observation Stay
Admission: EM | Admit: 2021-11-20 | Discharge: 2021-11-20 | Disposition: A | Discharge status: Home / Self Care | Payer: Medicaid Other | Attending: Advanced Practice Midwife | Admitting: Advanced Practice Midwife

## 2021-11-20 DIAGNOSIS — Z3A34 34 weeks gestation of pregnancy: Secondary | ICD-10-CM | POA: Insufficient documentation

## 2021-11-20 DIAGNOSIS — Z79899 Other long term (current) drug therapy: Secondary | ICD-10-CM | POA: Insufficient documentation

## 2021-11-20 DIAGNOSIS — Z87891 Personal history of nicotine dependence: Secondary | ICD-10-CM | POA: Diagnosis not present

## 2021-11-20 DIAGNOSIS — O10013 Pre-existing essential hypertension complicating pregnancy, third trimester: Secondary | ICD-10-CM | POA: Insufficient documentation

## 2021-11-20 DIAGNOSIS — O368130 Decreased fetal movements, third trimester, not applicable or unspecified: Principal | ICD-10-CM | POA: Insufficient documentation

## 2021-11-20 DIAGNOSIS — Z7982 Long term (current) use of aspirin: Secondary | ICD-10-CM | POA: Insufficient documentation

## 2021-11-20 DIAGNOSIS — O24419 Gestational diabetes mellitus in pregnancy, unspecified control: Secondary | ICD-10-CM | POA: Insufficient documentation

## 2021-11-20 DIAGNOSIS — O368190 Decreased fetal movements, unspecified trimester, not applicable or unspecified: Secondary | ICD-10-CM | POA: Diagnosis present

## 2021-11-20 LAB — RUPTURE OF MEMBRANE (ROM)PLUS: Rom Plus: NEGATIVE

## 2021-11-20 LAB — URINALYSIS, COMPLETE (UACMP) WITH MICROSCOPIC
Bilirubin Urine: NEGATIVE
Glucose, UA: NEGATIVE mg/dL
Ketones, ur: 20 mg/dL — AB
Nitrite: NEGATIVE
Protein, ur: NEGATIVE mg/dL
RBC / HPF: >50 RBC/hpf — ABNORMAL HIGH (ref 0–5)
Specific Gravity, Urine: 1.018 (ref 1.005–1.030)
pH: 6 (ref 5.0–8.0)

## 2021-11-20 NOTE — Discharge Summary (Signed)
Physician Final Progress Note ? ?Patient ID: ?Lori Henry ?MRN: [id number] ?DOB/AGE: [birth year] 26 y.o. ? ?Admit date: 11/20/2021 ?Admitting provider: Harlin Heys, MD ?Discharge date: 11/20/2021 ? ? ?Admission Diagnoses:  ?1) intrauterine pregnancy at [redacted]w[redacted]d ?2) decreased fetal movement ? ?Discharge Diagnoses:  ?Principal Problem: ?  Decreased fetal movement ?  ? ?History of Present Illness: The patient is a 26y.o. female G1P0 at 386w1dho presents for decreased fetal movement since yesterday. She also reports fluid leaking and pelvic area cramping. She denies vaginal bleeding. She denies vaginal itching or irritation. She denies urinary burning or frequency. She was admitted for observation, placed on monitors, labs collected. ROM plus was negative and NST reactive. UA indicates dehydration and possible UTI. Culture ordered. Patient is discharged to home with instructions and precautions. She has a follow up appointment with Dr ChMarcelline Matesomorrow.   ? ?Past Medical History:  ?Diagnosis Date  ? Anxiety   ? Depression   ? Gestational diabetes   ? Hypertension   ? Morbid obesity ([place]  ? Seizures ([facility]  ? Febrile seizures as a child and undiagnosed seizure on 01/2015  ? ? ?Past Surgical History:  ?Procedure Laterality Date  ? CHOLECYSTECTOMY N/A 10/23/2016  ? Procedure: LAPAROSCOPIC CHOLECYSTECTOMY WITH INTRAOPERATIVE CHOLANGIOGRAM;  Surgeon: JaLeonie GreenMD;  Location: [facility] ORS;  Service: General;  Laterality: N/A;  ? MOUTH BIOPSY    ? benign  ? MOUTH SURGERY    ? WISDOM TOOTH EXTRACTION    ? ? ?No current facility-administered medications on file prior to encounter.  ? ?Current Outpatient Medications on File Prior to Encounter  ?Medication Sig Dispense Refill  ? aspirin EC 81 MG tablet Take 81 mg by mouth daily. Swallow whole.    ? Blood Pressure Monitoring (BLOOD PRESSURE KIT) DEVI 1 kit by Does not apply route daily. 1 each 0  ? Butalbital-APAP-Caffeine 50-325-40 MG capsule Take 1-2 capsules by  mouth every 6 (six) hours as needed for headache. 30 capsule 0  ? Glucose Blood (BLOOD GLUCOSE TEST STRIPS) STRP 100 each by In Vitro route daily. 100 strip 1  ? NIFEdipine (PROCARDIA-XL/NIFEDICAL-XL) 30 MG 24 hr tablet Take 1 tablet (30 mg total) by mouth daily. Can increase to twice a day as needed for elevated blood pressures 30 tablet 2  ? Prenatal MV-Min-FA-Omega-3 (PRENATAL GUMMIES/DHA & FA) 0.4-32.5 MG CHEW Chew 2 tablets by mouth daily. 90 tablet 3  ? ? ?Allergies  ?Allergen Reactions  ? Bee Venom Anaphylaxis  ? ? ?Social History  ? ?Socioeconomic History  ? Marital status: Legally Separated  ?  Spouse name: Not on file  ? Number of children: Not on file  ? Years of education: Not on file  ? Highest education level: Not on file  ?Occupational History  ? Not on file  ?Tobacco Use  ? Smoking status: Former  ?  Packs/day: 0.25  ?  Years: 5.00  ?  Pack years: 1.25  ?  Types: Cigarettes  ?  Quit date: 04/05/2021  ?  Years since quitting: 0.6  ? Smokeless tobacco: Former  ?  Types: Chew  ?Vaping Use  ? Vaping Use: Never used  ?Substance and Sexual Activity  ? Alcohol use: No  ? Drug use: No  ? Sexual activity: Not Currently  ?  Birth control/protection: None  ?Other Topics Concern  ? Not on file  ?Social History Narrative  ? Not on file  ? ?Social Determinants of Health  ? ?Financial  Resource Strain: Not on file  ?Food Insecurity: Not on file  ?Transportation Needs: Not on file  ?Physical Activity: Not on file  ?Stress: Not on file  ?Social Connections: Not on file  ?Intimate Partner Violence: Not on file  ? ? ?Family History  ?Problem Relation Age of Onset  ? Hyperlipidemia Mother   ? Diabetes Mother   ? Hypertension Mother   ? Healthy Father   ?  ? ?Review of Systems  ?Constitutional:  Negative for chills and fever.  ?HENT:  Negative for congestion, ear discharge, ear pain, hearing loss, sinus pain and sore throat.   ?Eyes:  Negative for blurred vision and double vision.  ?Respiratory:  Negative for cough,  shortness of breath and wheezing.   ?Cardiovascular:  Negative for chest pain, palpitations and leg swelling.  ?Gastrointestinal:  Positive for abdominal pain. Negative for blood in stool, constipation, diarrhea, heartburn, melena, nausea and vomiting.  ?Genitourinary:  Negative for dysuria, flank pain, frequency, hematuria and urgency.  ?     Positive for vaginal fluid/discharge  ?Musculoskeletal:  Negative for back pain, joint pain and myalgias.  ?Skin:  Negative for itching and rash.  ?Neurological:  Negative for dizziness, tingling, tremors, sensory change, speech change, focal weakness, seizures, loss of consciousness, weakness and headaches.  ?Endo/Heme/Allergies:  Negative for environmental allergies. Does not bruise/bleed easily.  ?Psychiatric/Behavioral:  Negative for depression, hallucinations, memory loss, substance abuse and suicidal ideas. The patient is not nervous/anxious and does not have insomnia.    ? ?Physical Exam: ?BP 131/78   Pulse 87   Temp 98.8 ?F (37.1 ?C) (Oral)   Resp 16   Ht _0  (1.651 m)   Wt 125 kg   LMP  (LMP Unknown)   BMI 45.86 kg/m?   ?Constitutional: Obese female in no acute distress.  ?HEENT: normal ?Skin: Warm and dry.  ?Cardiovascular: Regular rate and rhythm.   ?Respiratory: Clear to auscultation bilateral. Normal respiratory effort ?Abdomen: FHT present ?Back: no CVAT ?Neuro: DTRs 2+, Cranial nerves grossly intact ?Psych: Alert and Oriented x3. No memory deficits. Normal mood and affect.  ? ?Pelvic exam: (female chaperone present) ?is not limited by body habitus/ROM plus swab collected ?EGBUS: within normal limits ?Vagina: within normal limits and with normal mucosa  ? ?Fetal well being: 125 bpm, moderate variability, +accelerations, -decelerations ?Toco: negative ? ?Consults: None ? ?Significant Findings/ Diagnostic Studies: labs:  ? ?Procedures: NST ? ?Hospital Course: The patient was admitted to Labor and Delivery Triage for observation.  ? ?Discharge Condition:  good ? ?Disposition: Discharge disposition: 01-Home or Self Care ? ?Diet: Diabetic diet ? ?Discharge Activity: Activity as tolerated ? ? ?Allergies as of 11/20/2021   ? ?   Reactions  ? Bee Venom Anaphylaxis  ? ?  ? ?  ?Medication List  ?  ? ?TAKE these medications   ? ?aspirin EC 81 MG tablet ?Take 81 mg by mouth daily. Swallow whole. ?  ?BLOOD GLUCOSE TEST STRIPS Strp ?100 each by In Vitro route daily. ?  ?Blood Pressure Kit Devi ?1 kit by Does not apply route daily. ?  ?Butalbital-APAP-Caffeine 50-325-40 MG capsule ?Take 1-2 capsules by mouth every 6 (six) hours as needed for headache. ?  ?NIFEdipine 30 MG 24 hr tablet ?Commonly known as: PROCARDIA-XL/NIFEDICAL-XL ?Take 1 tablet (30 mg total) by mouth daily. Can increase to twice a day as needed for elevated blood pressures ?  ?Prenatal Gummies/DHA & FA 0.4-32.5 MG Chew ?Chew 2 tablets by mouth daily. ?  ? ?  ? ? ? ?  Total time spent taking care of this patient: 23 minutes ? ?Signed: ?Rod Can, CNM  ?11/20/2021, 8:32 PM  ?

## 2021-11-20 NOTE — OB Triage Note (Signed)
Pt is G1P0 at 34.1 weeks with c/o decreased fetal movement. Hasnt felt baby move since yesterday. And also vaginal discharge. No urinary sx. Pt is here with her mother. Pt went to today at [facility] today. She did not call the office today. ?

## 2021-11-21 ENCOUNTER — Ambulatory Visit (INDEPENDENT_AMBULATORY_CARE_PROVIDER_SITE_OTHER): Payer: Medicaid Other | Admitting: Obstetrics and Gynecology

## 2021-11-21 ENCOUNTER — Other Ambulatory Visit: Payer: Medicaid Other

## 2021-11-21 ENCOUNTER — Encounter: Payer: Self-pay | Admitting: Obstetrics and Gynecology

## 2021-11-21 VITALS — BP 141/83 | HR 83 | Wt 274.0 lb

## 2021-11-21 DIAGNOSIS — O99210 Obesity complicating pregnancy, unspecified trimester: Secondary | ICD-10-CM

## 2021-11-21 DIAGNOSIS — O24410 Gestational diabetes mellitus in pregnancy, diet controlled: Secondary | ICD-10-CM

## 2021-11-21 DIAGNOSIS — O0993 Supervision of high risk pregnancy, unspecified, third trimester: Secondary | ICD-10-CM

## 2021-11-21 DIAGNOSIS — O10912 Unspecified pre-existing hypertension complicating pregnancy, second trimester: Secondary | ICD-10-CM

## 2021-11-21 DIAGNOSIS — Z3A34 34 weeks gestation of pregnancy: Secondary | ICD-10-CM

## 2021-11-21 LAB — POCT URINALYSIS DIPSTICK OB
Bilirubin, UA: NEGATIVE
Blood, UA: NEGATIVE
Glucose, UA: NEGATIVE
Ketones, UA: NEGATIVE
Leukocytes, UA: NEGATIVE
Nitrite, UA: NEGATIVE
Spec Grav, UA: 1.015 (ref 1.010–1.025)
Urobilinogen, UA: 0.2 E.U./dL
pH, UA: 6.5 (ref 5.0–8.0)

## 2021-11-21 NOTE — Progress Notes (Signed)
ROB: She is doing well today. Baby is a lot more active today. No new concerns. No NST today, had one yesterday at ED. ?

## 2021-11-21 NOTE — Patient Instructions (Signed)

## 2021-11-21 NOTE — Progress Notes (Signed)
ROB: Notes she forgot her blood sugar log, but states they have been good. Was seen in triage last night for decreased fetal movement. Had reactive NST. Today is noting much more movement.  RTC in 2 weeks, for growth scan then. TWG 4 lbs thus far.  ?

## 2021-11-22 LAB — URINE CULTURE

## 2021-12-02 ENCOUNTER — Ambulatory Visit (INDEPENDENT_AMBULATORY_CARE_PROVIDER_SITE_OTHER): Payer: Medicaid Other

## 2021-12-02 DIAGNOSIS — O24410 Gestational diabetes mellitus in pregnancy, diet controlled: Secondary | ICD-10-CM

## 2021-12-02 DIAGNOSIS — O10913 Unspecified pre-existing hypertension complicating pregnancy, third trimester: Secondary | ICD-10-CM | POA: Diagnosis not present

## 2021-12-02 DIAGNOSIS — O99210 Obesity complicating pregnancy, unspecified trimester: Secondary | ICD-10-CM

## 2021-12-02 DIAGNOSIS — O99213 Obesity complicating pregnancy, third trimester: Secondary | ICD-10-CM | POA: Diagnosis not present

## 2021-12-02 DIAGNOSIS — Z3A35 35 weeks gestation of pregnancy: Secondary | ICD-10-CM

## 2021-12-02 DIAGNOSIS — O10912 Unspecified pre-existing hypertension complicating pregnancy, second trimester: Secondary | ICD-10-CM

## 2021-12-03 ENCOUNTER — Ambulatory Visit (INDEPENDENT_AMBULATORY_CARE_PROVIDER_SITE_OTHER): Payer: Medicaid Other | Admitting: Obstetrics and Gynecology

## 2021-12-03 ENCOUNTER — Encounter: Payer: Self-pay | Admitting: Obstetrics and Gynecology

## 2021-12-03 VITALS — BP 152/100 | HR 78 | Wt 270.6 lb

## 2021-12-03 DIAGNOSIS — O24410 Gestational diabetes mellitus in pregnancy, diet controlled: Secondary | ICD-10-CM

## 2021-12-03 DIAGNOSIS — Z113 Encounter for screening for infections with a predominantly sexual mode of transmission: Secondary | ICD-10-CM

## 2021-12-03 DIAGNOSIS — O0993 Supervision of high risk pregnancy, unspecified, third trimester: Secondary | ICD-10-CM

## 2021-12-03 DIAGNOSIS — Z3685 Encounter for antenatal screening for Streptococcus B: Secondary | ICD-10-CM

## 2021-12-03 DIAGNOSIS — O10913 Unspecified pre-existing hypertension complicating pregnancy, third trimester: Secondary | ICD-10-CM

## 2021-12-03 DIAGNOSIS — O99210 Obesity complicating pregnancy, unspecified trimester: Secondary | ICD-10-CM

## 2021-12-03 DIAGNOSIS — Z3A36 36 weeks gestation of pregnancy: Secondary | ICD-10-CM

## 2021-12-03 DIAGNOSIS — O365990 Maternal care for other known or suspected poor fetal growth, unspecified trimester, not applicable or unspecified: Secondary | ICD-10-CM

## 2021-12-03 LAB — POCT URINALYSIS DIPSTICK OB
Bilirubin, UA: NEGATIVE
Glucose, UA: NEGATIVE
Ketones, UA: NEGATIVE
Nitrite, UA: NEGATIVE
POC,PROTEIN,UA: NEGATIVE
Spec Grav, UA: 1.01 (ref 1.010–1.025)
Urobilinogen, UA: 0.2 E.U./dL
pH, UA: 7 (ref 5.0–8.0)

## 2021-12-03 NOTE — Progress Notes (Signed)
ROB: Patient notes concern about her ultrasound yesterday. Reviewed [place] with patient, growth <1%ile, AFI normal, BPP 8/8.  Will refer to MFM for further recommendations but advised that delivery likely indicated between 37-38 weeks based on Doppler studies. BPs elevated today but patient notes she was stressed about today's visit. Improved by end of visit. Forgot blood sugar log, notes she has been inconsistent with checking blood sugars over the past 1-2 weeks, but did check this morning and blood sugar was 93. Encouraged better compliance. . Will increase Procardia to BID.  ?

## 2021-12-03 NOTE — Progress Notes (Signed)
ROB: Patient is having a lot of pressure when she walks. She has concerns about her ultrasound. GBS/G/C done today. ?

## 2021-12-04 ENCOUNTER — Other Ambulatory Visit: Payer: Self-pay

## 2021-12-04 DIAGNOSIS — O365930 Maternal care for other known or suspected poor fetal growth, third trimester, not applicable or unspecified: Secondary | ICD-10-CM

## 2021-12-04 DIAGNOSIS — O10913 Unspecified pre-existing hypertension complicating pregnancy, third trimester: Secondary | ICD-10-CM

## 2021-12-04 DIAGNOSIS — O24410 Gestational diabetes mellitus in pregnancy, diet controlled: Secondary | ICD-10-CM

## 2021-12-04 DIAGNOSIS — O365939 Maternal care for other known or suspected poor fetal growth, third trimester, other fetus: Secondary | ICD-10-CM

## 2021-12-05 ENCOUNTER — Ambulatory Visit (HOSPITAL_BASED_OUTPATIENT_CLINIC_OR_DEPARTMENT_OTHER): Payer: Medicaid Other | Admitting: *Deleted

## 2021-12-05 ENCOUNTER — Telehealth: Payer: Self-pay | Admitting: Obstetrics and Gynecology

## 2021-12-05 ENCOUNTER — Ambulatory Visit: Payer: Medicaid Other | Attending: Obstetrics

## 2021-12-05 ENCOUNTER — Ambulatory Visit: Payer: Medicaid Other | Admitting: *Deleted

## 2021-12-05 ENCOUNTER — Other Ambulatory Visit: Payer: Self-pay | Admitting: Obstetrics

## 2021-12-05 ENCOUNTER — Ambulatory Visit: Payer: Medicaid Other | Attending: Maternal & Fetal Medicine | Admitting: Maternal & Fetal Medicine

## 2021-12-05 ENCOUNTER — Encounter: Payer: Self-pay | Admitting: *Deleted

## 2021-12-05 ENCOUNTER — Encounter: Payer: Self-pay | Admitting: Obstetrics and Gynecology

## 2021-12-05 VITALS — BP 132/95 | HR 82

## 2021-12-05 DIAGNOSIS — O365930 Maternal care for other known or suspected poor fetal growth, third trimester, not applicable or unspecified: Secondary | ICD-10-CM | POA: Diagnosis not present

## 2021-12-05 DIAGNOSIS — O365939 Maternal care for other known or suspected poor fetal growth, third trimester, other fetus: Secondary | ICD-10-CM | POA: Diagnosis present

## 2021-12-05 DIAGNOSIS — O3663X0 Maternal care for excessive fetal growth, third trimester, not applicable or unspecified: Secondary | ICD-10-CM

## 2021-12-05 DIAGNOSIS — O10913 Unspecified pre-existing hypertension complicating pregnancy, third trimester: Secondary | ICD-10-CM | POA: Insufficient documentation

## 2021-12-05 DIAGNOSIS — O99213 Obesity complicating pregnancy, third trimester: Secondary | ICD-10-CM | POA: Diagnosis not present

## 2021-12-05 DIAGNOSIS — Z3A36 36 weeks gestation of pregnancy: Secondary | ICD-10-CM | POA: Diagnosis not present

## 2021-12-05 DIAGNOSIS — O10013 Pre-existing essential hypertension complicating pregnancy, third trimester: Secondary | ICD-10-CM

## 2021-12-05 DIAGNOSIS — I10 Essential (primary) hypertension: Secondary | ICD-10-CM

## 2021-12-05 DIAGNOSIS — O0933 Supervision of pregnancy with insufficient antenatal care, third trimester: Secondary | ICD-10-CM

## 2021-12-05 DIAGNOSIS — Z364 Encounter for antenatal screening for fetal growth retardation: Secondary | ICD-10-CM

## 2021-12-05 DIAGNOSIS — O24410 Gestational diabetes mellitus in pregnancy, diet controlled: Secondary | ICD-10-CM | POA: Diagnosis not present

## 2021-12-05 DIAGNOSIS — O10912 Unspecified pre-existing hypertension complicating pregnancy, second trimester: Secondary | ICD-10-CM

## 2021-12-05 LAB — STREP GP B NAA: Strep Gp B NAA: POSITIVE — AB

## 2021-12-05 LAB — GC/CHLAMYDIA PROBE AMP
Chlamydia trachomatis, NAA: NEGATIVE
Neisseria Gonorrhoeae by PCR: NEGATIVE

## 2021-12-05 NOTE — Procedures (Signed)
Lori Henry ?02-24-1996 ?[redacted]w[redacted]d ? ?Fetus A Non-Stress Test Interpretation for 12/05/21 ? ?Indication: Unsatisfactory BPP ? ?Fetal Heart Rate A ?Mode: External ?Baseline Rate (A): 130 bpm ?Variability: Moderate ?Accelerations: 15 x 15 ?Decelerations: None ?Multiple birth?: No ? ?Uterine Activity ?Mode: Toco ?Contraction Frequency (min): none ?Resting Tone Palpated: Relaxed ? ?Interpretation (Fetal Testing) ?Nonstress Test Interpretation: Reactive ?Overall Impression: Reassuring for gestational age ?Comments: tracing reviewed by Dr. Grace Bushy ? ? ?

## 2021-12-05 NOTE — Telephone Encounter (Signed)
Patient called and states that she would like a call back explaining the results of her positive strep and wanted to know if it affects the delivery of her baby. Please advise.  ?

## 2021-12-05 NOTE — Progress Notes (Signed)
MFM Brief Note ? ?Lori Henry is a G1P0 with known chronic hypertension and suspected fetal growth restriction on an outside exam ?She is seen today at the request of Dr. Rubie Maid ? ?Single intrauterine pregnancy here for a detailed exam with FGR noted on an outside exam ?Normal anatomy with measurements less than dates due to severe fetal growth restriction. ?There is good fetal movement and amniotic fluid volume ?The UA Dopplers are normal without evidence of AEDF or REDF ?Biophysical profile is 8/10 ?Suboptimal views of the fetal anatomy  ? ?I discussed today's visit with a diagnosis of severe FGR. I explained that the etiology includes placental insufficiency, chronic disease, infection, aneuploidy and other genetic syndromes. She has a low risk NIPS. She has chronic hypertension as an additional risk factor for FGR.  ? ?At this time I explained the diagnosis, evaluation and management to include delivery at 37 weeks given her the EFW < 3rd%  ? ?All questions answered. ? ?I discussed this plan of care with Dr. Marcelline Mates as well who is in agreement.  ? ?I spent 30 minutes with > 50% in face to face consultation and care coordination. ? ?Vikki Ports, MD ?

## 2021-12-05 NOTE — Telephone Encounter (Signed)
Called patient to let her know that she would get an antibiotic at delivery for the positive GBS. Also informed her that Dr. Valentino Saxon will either send her mychart message or call her about induction. ?

## 2021-12-05 NOTE — Progress Notes (Signed)
Over heard a phone conversation with FOB's step sister about the fact that "they are homeless and what are they going to do with a baby in their homeless state?"   ?I addressed this issue with the patient and the FOB. He assured me he worked and made $15/hr and had enough money to pay for a hotel for the next week. When asked what they would do after that week they told me they were moving to [place] with a friend and they will have a room to stay in. I asked if they need more help and they declined. ?

## 2021-12-07 ENCOUNTER — Other Ambulatory Visit: Payer: Self-pay

## 2021-12-07 ENCOUNTER — Observation Stay
Admission: EM | Admit: 2021-12-07 | Discharge: 2021-12-07 | Disposition: A | Discharge status: Home / Self Care | Payer: Medicaid Other | Attending: Obstetrics and Gynecology | Admitting: Obstetrics and Gynecology

## 2021-12-07 ENCOUNTER — Encounter: Payer: Self-pay | Admitting: Obstetrics and Gynecology

## 2021-12-07 DIAGNOSIS — O24410 Gestational diabetes mellitus in pregnancy, diet controlled: Principal | ICD-10-CM | POA: Insufficient documentation

## 2021-12-07 DIAGNOSIS — Z3A36 36 weeks gestation of pregnancy: Secondary | ICD-10-CM | POA: Insufficient documentation

## 2021-12-07 DIAGNOSIS — Z3A37 37 weeks gestation of pregnancy: Secondary | ICD-10-CM | POA: Diagnosis not present

## 2021-12-07 DIAGNOSIS — O26893 Other specified pregnancy related conditions, third trimester: Secondary | ICD-10-CM | POA: Diagnosis not present

## 2021-12-07 DIAGNOSIS — R109 Unspecified abdominal pain: Secondary | ICD-10-CM | POA: Diagnosis not present

## 2021-12-07 NOTE — Progress Notes (Signed)
Discharge home. Left floor ambulatory with significant other. Lori Henry  

## 2021-12-09 ENCOUNTER — Ambulatory Visit (INDEPENDENT_AMBULATORY_CARE_PROVIDER_SITE_OTHER): Payer: Medicaid Other | Admitting: Obstetrics and Gynecology

## 2021-12-09 ENCOUNTER — Encounter: Payer: Self-pay | Admitting: Obstetrics and Gynecology

## 2021-12-09 VITALS — BP 120/99 | HR 88

## 2021-12-09 DIAGNOSIS — O0993 Supervision of high risk pregnancy, unspecified, third trimester: Secondary | ICD-10-CM

## 2021-12-09 DIAGNOSIS — O10013 Pre-existing essential hypertension complicating pregnancy, third trimester: Secondary | ICD-10-CM

## 2021-12-09 DIAGNOSIS — O365990 Maternal care for other known or suspected poor fetal growth, unspecified trimester, not applicable or unspecified: Secondary | ICD-10-CM | POA: Insufficient documentation

## 2021-12-09 DIAGNOSIS — O24410 Gestational diabetes mellitus in pregnancy, diet controlled: Secondary | ICD-10-CM

## 2021-12-09 DIAGNOSIS — O2343 Unspecified infection of urinary tract in pregnancy, third trimester: Secondary | ICD-10-CM

## 2021-12-09 DIAGNOSIS — Z3A36 36 weeks gestation of pregnancy: Secondary | ICD-10-CM

## 2021-12-09 LAB — POCT URINALYSIS DIPSTICK
Bilirubin, UA: NEGATIVE
Glucose, UA: POSITIVE — AB
Protein, UA: POSITIVE — AB
Spec Grav, UA: 1.01 (ref 1.010–1.025)
Urobilinogen, UA: NEGATIVE E.U./dL — AB
pH, UA: 7.5 (ref 5.0–8.0)

## 2021-12-09 MED ORDER — NITROFURANTOIN MONOHYD MACRO 100 MG PO CAPS: 100.0000 mg | ORAL_CAPSULE | Freq: Two times a day (BID) | ORAL | 1 refills | Status: DC

## 2021-12-09 NOTE — Progress Notes (Signed)
ROB: Patient notes she is nervous about her induction tomorrow.  Discussed process of IOL. Reviewed 36 week labs, is GBS positive, for antibiotics in labor. Was seen in triage 2 days ago.  Discussed current housing situation, patient notes she and FOB will be staying in a hotel after delivery. Patient and her mother are at odds over FOB and is not currently a support person for her at this time. Seen by SW today, will also f/u postpartum. Patient noting mild urinary frequency, UA positive for nitrates, will prescribe Macrobid.  ?

## 2021-12-10 ENCOUNTER — Inpatient Hospital Stay
Admission: RE | Admit: 2021-12-10 | Discharge: 2021-12-14 | DRG: 786 | Disposition: A | Discharge status: Home / Self Care | Payer: Medicaid Other | Source: Ambulatory Visit | Attending: Obstetrics and Gynecology | Admitting: Obstetrics and Gynecology

## 2021-12-10 ENCOUNTER — Other Ambulatory Visit: Payer: Self-pay

## 2021-12-10 ENCOUNTER — Inpatient Hospital Stay: Payer: Medicaid Other | Admitting: Anesthesiology

## 2021-12-10 ENCOUNTER — Encounter: Payer: Self-pay | Admitting: Obstetrics and Gynecology

## 2021-12-10 DIAGNOSIS — O24420 Gestational diabetes mellitus in childbirth, diet controlled: Secondary | ICD-10-CM | POA: Diagnosis present

## 2021-12-10 DIAGNOSIS — O365930 Maternal care for other known or suspected poor fetal growth, third trimester, not applicable or unspecified: Principal | ICD-10-CM | POA: Diagnosis present

## 2021-12-10 DIAGNOSIS — O99214 Obesity complicating childbirth: Secondary | ICD-10-CM | POA: Diagnosis present

## 2021-12-10 DIAGNOSIS — O1002 Pre-existing essential hypertension complicating childbirth: Secondary | ICD-10-CM | POA: Diagnosis present

## 2021-12-10 DIAGNOSIS — O24410 Gestational diabetes mellitus in pregnancy, diet controlled: Secondary | ICD-10-CM | POA: Diagnosis not present

## 2021-12-10 DIAGNOSIS — N390 Urinary tract infection, site not specified: Secondary | ICD-10-CM | POA: Diagnosis present

## 2021-12-10 DIAGNOSIS — O99824 Streptococcus B carrier state complicating childbirth: Secondary | ICD-10-CM | POA: Diagnosis present

## 2021-12-10 DIAGNOSIS — Z87891 Personal history of nicotine dependence: Secondary | ICD-10-CM | POA: Diagnosis not present

## 2021-12-10 DIAGNOSIS — O2343 Unspecified infection of urinary tract in pregnancy, third trimester: Secondary | ICD-10-CM | POA: Diagnosis not present

## 2021-12-10 DIAGNOSIS — Z3A37 37 weeks gestation of pregnancy: Secondary | ICD-10-CM

## 2021-12-10 DIAGNOSIS — O9882 Other maternal infectious and parasitic diseases complicating childbirth: Secondary | ICD-10-CM | POA: Diagnosis not present

## 2021-12-10 DIAGNOSIS — O365990 Maternal care for other known or suspected poor fetal growth, unspecified trimester, not applicable or unspecified: Secondary | ICD-10-CM | POA: Diagnosis present

## 2021-12-10 DIAGNOSIS — O10019 Pre-existing essential hypertension complicating pregnancy, unspecified trimester: Secondary | ICD-10-CM

## 2021-12-10 DIAGNOSIS — B951 Streptococcus, group B, as the cause of diseases classified elsewhere: Secondary | ICD-10-CM | POA: Diagnosis not present

## 2021-12-10 LAB — CBC
HCT: 40.2 % (ref 36.0–46.0)
Hemoglobin: 13.1 g/dL (ref 12.0–15.0)
MCH: 26.7 pg (ref 26.0–34.0)
MCHC: 32.6 g/dL (ref 30.0–36.0)
MCV: 81.9 fL (ref 80.0–100.0)
Platelets: 368 10*3/uL (ref 150–400)
RBC: 4.91 MIL/uL (ref 3.87–5.11)
RDW: 14.7 % (ref 11.5–15.5)
WBC: 18.4 10*3/uL — ABNORMAL HIGH (ref 4.0–10.5)
nRBC: 0 % (ref 0.0–0.2)

## 2021-12-10 LAB — GLUCOSE, CAPILLARY
Glucose-Capillary: 101 mg/dL — ABNORMAL HIGH (ref 70–99)
Glucose-Capillary: 79 mg/dL (ref 70–99)
Glucose-Capillary: 95 mg/dL (ref 70–99)
Glucose-Capillary: 92 mg/dL (ref 70–99)

## 2021-12-10 LAB — TYPE AND SCREEN
ABO/RH(D): A POS
Antibody Screen: NEGATIVE

## 2021-12-10 LAB — RPR: RPR Ser Ql: NONREACTIVE

## 2021-12-10 MED ORDER — PENICILLIN G POT IN DEXTROSE 60000 UNIT/ML IV SOLN
3.0000 10*6.[IU] | INTRAVENOUS | Status: DC
  Administered 2021-12-10 – 2021-12-11 (×6): 3 10*6.[IU] via INTRAVENOUS
  Filled 2021-12-10 (×6): qty 50

## 2021-12-10 MED ORDER — FENTANYL-BUPIVACAINE-NACL 0.5-0.125-0.9 MG/250ML-% EP SOLN
12.0000 mL/h | EPIDURAL | Status: DC | PRN
  Administered 2021-12-10 – 2021-12-11 (×2): 12 mL/h via EPIDURAL
  Filled 2021-12-10: qty 250

## 2021-12-10 MED ORDER — PHENYLEPHRINE 80 MCG/ML (10ML) SYRINGE FOR IV PUSH (FOR BLOOD PRESSURE SUPPORT): 80.0000 ug | PREFILLED_SYRINGE | INTRAVENOUS | Status: DC | PRN

## 2021-12-10 MED ORDER — OXYTOCIN BOLUS FROM INFUSION: 333.0000 mL | Freq: Once | INTRAVENOUS | Status: DC

## 2021-12-10 MED ORDER — DIPHENHYDRAMINE HCL 50 MG/ML IJ SOLN: 12.5000 mg | INTRAMUSCULAR | Status: DC | PRN

## 2021-12-10 MED ORDER — LACTATED RINGERS IV SOLN
500.0000 mL | INTRAVENOUS | Status: DC | PRN
  Administered 2021-12-11 (×3): 500 mL via INTRAVENOUS

## 2021-12-10 MED ORDER — OXYCODONE-ACETAMINOPHEN 5-325 MG PO TABS: 1.0000 | ORAL_TABLET | ORAL | Status: DC | PRN

## 2021-12-10 MED ORDER — EPHEDRINE 5 MG/ML INJ: 10.0000 mg | INTRAVENOUS | Status: DC | PRN

## 2021-12-10 MED ORDER — LIDOCAINE HCL (PF) 1 % IJ SOLN: 30.0000 mL | INTRAMUSCULAR | Status: DC | PRN

## 2021-12-10 MED ORDER — NITROFURANTOIN MONOHYD MACRO 100 MG PO CAPS
100.0000 mg | ORAL_CAPSULE | Freq: Two times a day (BID) | ORAL | Status: DC
  Administered 2021-12-10 – 2021-12-14 (×8): 100 mg via ORAL
  Filled 2021-12-10 (×8): qty 1

## 2021-12-10 MED ORDER — LIDOCAINE-EPINEPHRINE (PF) 1.5 %-1:200000 IJ SOLN
INTRAMUSCULAR | Status: DC | PRN
  Administered 2021-12-10: 4 mL via EPIDURAL
  Administered 2021-12-11: 3 mL via EPIDURAL

## 2021-12-10 MED ORDER — LACTATED RINGERS IV SOLN
500.0000 mL | Freq: Once | INTRAVENOUS | Status: AC
  Administered 2021-12-10: 500 mL via INTRAVENOUS

## 2021-12-10 MED ORDER — MISOPROSTOL 50MCG HALF TABLET
100.0000 ug | ORAL_TABLET | Freq: Once | ORAL | Status: AC
  Administered 2021-12-10: 100 ug via BUCCAL
  Filled 2021-12-10: qty 2

## 2021-12-10 MED ORDER — OXYCODONE-ACETAMINOPHEN 5-325 MG PO TABS: 2.0000 | ORAL_TABLET | ORAL | Status: DC | PRN

## 2021-12-10 MED ORDER — OXYTOCIN-SODIUM CHLORIDE 30-0.9 UT/500ML-% IV SOLN
2.5000 [IU]/h | INTRAVENOUS | Status: DC
Start: 2021-12-10 — End: 2021-12-11
  Filled 2021-12-10 (×2): qty 500

## 2021-12-10 MED ORDER — PENICILLIN G POTASSIUM 5000000 UNITS IJ SOLR
5.0000 10*6.[IU] | Freq: Once | INTRAMUSCULAR | Status: AC
  Administered 2021-12-10: 5 10*6.[IU] via INTRAVENOUS
  Filled 2021-12-10: qty 5

## 2021-12-10 MED ORDER — TERBUTALINE SULFATE 1 MG/ML IJ SOLN: 0.2500 mg | Freq: Once | INTRAMUSCULAR | Status: DC | PRN

## 2021-12-10 MED ORDER — MISOPROSTOL 25 MCG QUARTER TABLET
50.0000 ug | ORAL_TABLET | ORAL | Status: DC
  Administered 2021-12-10 (×2): 50 ug via VAGINAL
  Filled 2021-12-10 (×2): qty 1

## 2021-12-10 MED ORDER — FENTANYL-BUPIVACAINE-NACL 0.5-0.125-0.9 MG/250ML-% EP SOLN
EPIDURAL | Status: AC
  Filled 2021-12-10: qty 250

## 2021-12-10 MED ORDER — LACTATED RINGERS IV BOLUS
1000.0000 mL | Freq: Once | INTRAVENOUS | Status: AC
  Administered 2021-12-10: 1000 mL via INTRAVENOUS

## 2021-12-10 MED ORDER — LIDOCAINE HCL (PF) 1 % IJ SOLN
INTRAMUSCULAR | Status: DC | PRN
  Administered 2021-12-10: 4 mL via SUBCUTANEOUS
  Administered 2021-12-11: 3 mL via SUBCUTANEOUS

## 2021-12-10 MED ORDER — MISOPROSTOL 50MCG HALF TABLET
ORAL_TABLET | ORAL | Status: AC
  Filled 2021-12-10: qty 1

## 2021-12-10 MED ORDER — OXYTOCIN 10 UNIT/ML IJ SOLN
INTRAMUSCULAR | Status: AC
  Filled 2021-12-10: qty 2

## 2021-12-10 MED ORDER — MISOPROSTOL 200 MCG PO TABS
ORAL_TABLET | ORAL | Status: AC
  Filled 2021-12-10: qty 4

## 2021-12-10 MED ORDER — SOD CITRATE-CITRIC ACID 500-334 MG/5ML PO SOLN: 30.0000 mL | ORAL | Status: DC | PRN

## 2021-12-10 MED ORDER — BUPIVACAINE HCL (PF) 0.25 % IJ SOLN
INTRAMUSCULAR | Status: DC | PRN
  Administered 2021-12-10: 3 mL via EPIDURAL
  Administered 2021-12-10: 5 mL via EPIDURAL
  Administered 2021-12-11: 4 mL via EPIDURAL
  Administered 2021-12-11: 5 mL via EPIDURAL

## 2021-12-10 MED ORDER — LIDOCAINE HCL (PF) 1 % IJ SOLN
INTRAMUSCULAR | Status: AC
  Filled 2021-12-10: qty 30

## 2021-12-10 MED ORDER — ONDANSETRON HCL 4 MG/2ML IJ SOLN
4.0000 mg | Freq: Four times a day (QID) | INTRAMUSCULAR | Status: DC | PRN
Start: 2021-12-10 — End: 2021-12-11
  Administered 2021-12-11: 4 mg via INTRAVENOUS
  Filled 2021-12-10: qty 2

## 2021-12-10 MED ORDER — LACTATED RINGERS IV SOLN
125.0000 mL/h | INTRAVENOUS | Status: DC
  Administered 2021-12-10 – 2021-12-11 (×3): 125 mL/h via INTRAVENOUS

## 2021-12-10 MED ORDER — AMMONIA AROMATIC IN INHA
RESPIRATORY_TRACT | Status: AC
  Filled 2021-12-10: qty 10

## 2021-12-10 MED ORDER — BUTORPHANOL TARTRATE 1 MG/ML IJ SOLN: 1.0000 mg | INTRAMUSCULAR | Status: DC | PRN

## 2021-12-10 MED ORDER — NIFEDIPINE ER OSMOTIC RELEASE 30 MG PO TB24
30.0000 mg | ORAL_TABLET | Freq: Two times a day (BID) | ORAL | Status: DC
  Administered 2021-12-10 – 2021-12-14 (×8): 30 mg via ORAL
  Filled 2021-12-10 (×8): qty 1

## 2021-12-10 MED ORDER — ACETAMINOPHEN 325 MG PO TABS
650.0000 mg | ORAL_TABLET | ORAL | Status: DC | PRN
  Administered 2021-12-10: 650 mg via ORAL
  Filled 2021-12-10: qty 2

## 2021-12-10 NOTE — H&P (Signed)
Obstetric History and Physical ? ?Lori Henry is a 26 y.o. G1P0 with IUP at 59w0dpresenting for scheduled IOL for severe fetal growth restriction. Pregnancy also complicated by GDM (diet), cHTN on Procardia, . Patient states she has been having  no contractions,  no  vaginal bleeding, intact membranes, with active fetal movement.   ? ?Prenatal Course ?Source of Care: [facility] with onset of care at 161 weeks?Pregnancy complications or risks: ?Patient Active Problem List  ? Diagnosis Date Noted  ? IUGR (intrauterine growth restriction) affecting care of mother 12/10/2021  ? Pregnancy affected by fetal growth restriction 12/09/2021  ? Decreased fetal movement 11/20/2021  ? Indication for care in labor or delivery 10/29/2021  ? Maternal varicella, non-immune 10/10/2021  ? Gestational diabetes mellitus 10/10/2021  ? Fall 10/04/2021  ? History of kidney stones 09/27/2021  ? History of recurrent UTIs 09/27/2021  ? Vaginal spotting   ? Indication for care in labor and delivery, antepartum 09/26/2021  ? Chronic hypertension with exacerbation during pregnancy in second trimester 09/26/2021  ? Abdominal pain during pregnancy in second trimester   ? Pregnancy 09/04/2021  ? Essential hypertension 08/15/2019  ? PTSD (post-traumatic stress disorder) 08/15/2019  ? MDD (major depressive disorder) 08/14/2019  ? Panic attack   ? ?She plans to breastfeed ?She desires  undecided method  for postpartum contraception.  ? ?Prenatal labs and studies: ?ABO, Rh: --/--/A POS (05/10 1001) ?Antibody: NEG (05/10 1001) ?Rubella: 1.45 (03/08 0809) ?RPR: Non Reactive (03/08 0809)  ?HBsAg: Negative (03/08 0809)  ?HIV: Non Reactive (03/08 0809)  ?GBPZ:WCHENIDP/- (05/03 1012) ?1 hr Glucola  normal ?Genetic screening normal ?Anatomy [place]ormal ? ? ?Past Medical History:  ?Diagnosis Date  ? Anxiety   ? Depression   ? Gestational diabetes   ? Hypertension   ? Morbid obesity ([place]   ? Seizures ([place]   ? Febrile seizures as a child  and undiagnosed seizure on 01/2015  ? ? ?Past Surgical History:  ?Procedure Laterality Date  ? CHOLECYSTECTOMY N/A 10/23/2016  ? Procedure: LAPAROSCOPIC CHOLECYSTECTOMY WITH INTRAOPERATIVE CHOLANGIOGRAM;  Surgeon: JLeonie Green MD;  Location: [facility] ORS;  Service: General;  Laterality: N/A;  ? MOUTH BIOPSY    ? benign  ? MOUTH SURGERY    ? WISDOM TOOTH EXTRACTION    ? ? ?OB History  ?Gravida Para Term Preterm AB Living  ?1            ?SAB IAB Ectopic Multiple Live Births  ?           ?  ?# Outcome Date GA Lbr Len/2nd Weight Sex Delivery Anes PTL Lv  ?1 Current           ? ? ?Social History  ? ?Socioeconomic History  ? Marital status: Legally Separated  ?  Spouse name: Not on file  ? Number of children: Not on file  ? Years of education: Not on file  ? Highest education level: Not on file  ?Occupational History  ? Not on file  ?Tobacco Use  ? Smoking status: Former  ?  Packs/day: 0.25  ?  Years: 5.00  ?  Pack years: 1.25  ?  Types: Cigarettes  ?  Quit date: 04/05/2021  ?  Years since quitting: 0.6  ? Smokeless tobacco: Former  ?  Types: Chew  ?Vaping Use  ? Vaping Use: Never used  ?Substance and Sexual Activity  ? Alcohol use: No  ? Drug use: No  ? Sexual activity: Not  Currently  ?  Birth control/protection: None  ?Other Topics Concern  ? Not on file  ?Social History Narrative  ? Not on file  ? ?Social Determinants of Health  ? ?Financial Resource Strain: Not on file  ?Food Insecurity: Not on file  ?Transportation Needs: Not on file  ?Physical Activity: Not on file  ?Stress: Not on file  ?Social Connections: Not on file  ? ? ?Family History  ?Problem Relation Age of Onset  ? Hyperlipidemia Mother   ? Diabetes Mother   ? Hypertension Mother   ? Healthy Father   ? ? ?Medications Prior to Admission  ?Medication Sig Dispense Refill Last Dose  ? NIFEdipine (PROCARDIA-XL/NIFEDICAL-XL) 30 MG 24 hr tablet Take 1 tablet (30 mg total) by mouth daily. Can increase to twice a day as needed for elevated blood pressures 30 tablet  2 12/09/2021  ? nitrofurantoin, macrocrystal-monohydrate, (MACROBID) 100 MG capsule Take 1 capsule (100 mg total) by mouth 2 (two) times daily. 14 capsule 1 12/09/2021  ? Prenatal MV-Min-FA-Omega-3 (PRENATAL GUMMIES/DHA & FA) 0.4-32.5 MG CHEW Chew 2 tablets by mouth daily. 90 tablet 3 12/10/2021  ? aspirin EC 81 MG tablet Take 81 mg by mouth daily. Swallow whole. (Patient not taking: Reported on 12/10/2021)   Not Taking  ? Blood Pressure Monitoring (BLOOD PRESSURE KIT) DEVI 1 kit by Does not apply route daily. (Patient not taking: Reported on 12/07/2021) 1 each 0   ? Butalbital-APAP-Caffeine 50-325-40 MG capsule Take 1-2 capsules by mouth every 6 (six) hours as needed for headache. 30 capsule 0   ? Glucose Blood (BLOOD GLUCOSE TEST STRIPS) STRP 100 each by In Vitro route daily. 100 strip 1   ? ? ?Allergies  ?Allergen Reactions  ? Bee Venom Anaphylaxis  ? ? ?Review of Systems: Negative except for what is mentioned in HPI. ? ?Physical Exam: ?BP 126/86 (BP Location: Left Arm)   Pulse 93   Temp 97.9 ?F (36.6 ?C) (Oral)   Resp 16   Ht 5' 5" (1.651 m)   Wt 125.6 kg   LMP  (LMP Unknown)   BMI 46.10 kg/m?  ?CONSTITUTIONAL: Well-developed, well-nourished female in no acute distress.  ?HENT:  Normocephalic, atraumatic, External right and left ear normal. Oropharynx is clear and moist ?EYES: Conjunctivae and EOM are normal. Pupils are equal, round, and reactive to light. No scleral icterus.  ?NECK: Normal range of motion, supple, no masses ?SKIN: Skin is warm and dry. No rash noted. Not diaphoretic. No erythema. No pallor. ?NEUROLOGIC: Alert and oriented to person, place, and time. Normal reflexes, muscle tone coordination. No cranial nerve deficit noted. ?PSYCHIATRIC: Normal mood and affect. Normal behavior. Normal judgment and thought content. ?CARDIOVASCULAR: Normal heart rate noted, regular rhythm ?RESPIRATORY: Effort and breath sounds normal, no problems with respiration noted ?ABDOMEN: Soft, nontender, nondistended,  gravid. ?MUSCULOSKELETAL: Normal range of motion. No edema and no tenderness. 2+ distal pulses. ? ?Cervical Exam: Dilatation 0 cm   Effacement thick   Station -3   ?Presentation: cephalic ?FHT:  Baseline rate 150 bpm   Variability moderate  Accelerations present   Decelerations none ?Contractions: Rare ?  ?Pertinent Labs/Studies:   ?Results for orders placed or performed during the hospital encounter of 12/10/21 (from the past 24 hour(s))  ?CBC     Status: Abnormal  ? Collection Time: 12/10/21  8:20 AM  ?Result Value Ref Range  ? WBC 18.4 (H) 4.0 - 10.5 K/uL  ? RBC 4.91 3.87 - 5.11 MIL/uL  ? Hemoglobin 13.1 12.0 -  15.0 g/dL  ? HCT 40.2 36.0 - 46.0 %  ? MCV 81.9 80.0 - 100.0 fL  ? MCH 26.7 26.0 - 34.0 pg  ? MCHC 32.6 30.0 - 36.0 g/dL  ? RDW 14.7 11.5 - 15.5 %  ? Platelets 368 150 - 400 K/uL  ? nRBC 0.0 0.0 - 0.2 %  ?Type and screen     Status: None (Preliminary result)  ? Collection Time: 12/10/21  8:20 AM  ?Result Value Ref Range  ? ABO/RH(D) PENDING   ? Antibody Screen PENDING   ? Sample Expiration    ?  12/13/2021,2359 ?Performed at [facility], [address] [place], [place] [address] ?  ?Glucose, capillary     Status: Abnormal  ? Collection Time: 12/10/21  9:35 AM  ?Result Value Ref Range  ? Glucose-Capillary 101 (H) 70 - 99 mg/dL  ?Type and screen     Status: None  ? Collection Time: 12/10/21 10:01 AM  ?Result Value Ref Range  ? ABO/RH(D) A POS   ? Antibody Screen NEG   ? Sample Expiration    ?  12/13/2021,2359 ?Performed at [facility], [address] [place], [place] [address] ?  ? ? ? ?Imaging:  ?US OB Follow Up ?Patient Name: MYRTH DAHAN ?DOB: Jan 01, 1996 ?MRN: [id number] ? ?ULTRASOUND REPORT ? ?Location: [facility] ?Date of Service: 12/02/2021  ? ?Indications:growth/afi; GDM, cHTN, Morbid Obesity ? ?Findings:  ?Singleton intrauterine pregnancy is visualized with FHR at 143 BPM.  ? ?Biometrics give an (U/S) Gestational age of 60w0dand an (U/S) EDD of  ?01/20/22; this  does not correlate with the clinically established Estimated  ?Date of Delivery: 12/31/21.  ? ?Fetal presentation is Cephalic.  ?Placenta: posterior. Grade: Not well visualized due to advanced gestation  ?and mate

## 2021-12-10 NOTE — Progress Notes (Signed)
Intrapartum Progress Note ? ?S: Patient feeling comfortable with epidural.  ? ?O: Blood pressure 130/76, pulse (!) 106, temperature 98.3 ?F (36.8 ?C), temperature source Oral, resp. rate 16, height 5\' 5"  (1.651 m), weight 125.6 kg, SpO2 95 %. ?Gen App: NAD, comfortable ?Abdomen: soft, gravid ?FHT: baseline 145 bpm.  Accels present.  Decels absent. moderate in degree variability.   ?Tocometer: difficult to detect ?Cervix: foley bulb expelled on exam, 4/70/-2 ?Extremities: Nontender, no edema. ? ?Pitocin: none ? ?Labs:  ? Latest Reference Range & Units 12/10/21 18:08 12/10/21 22:11  ?Glucose-Capillary 70 - 99 mg/dL 95 92  ? ? ?Assessment:  ?1: SIUP at [redacted]w[redacted]d ?2. Severe IUGR ?3. Morbid obesity ?4. Gestational DM ?5. cHTN ? ?Plan:  ?1. Continue induction of labor. Will initiate Pitocin.  ?2. Continue accuchecks q 4 hours ?3. Continue Procardia for HTN.  ? ? ?[redacted]w[redacted]d, MD ?12/10/2021 10:53 PM ? ? ? ? ?

## 2021-12-10 NOTE — Progress Notes (Signed)
Intrapartum Progress Note ? ?S: Patient complaining of nausea and vomiting. Feeling more crampy. Thinking about getting an epidural.  ? ?O: Blood pressure (!) 132/105, pulse 96, temperature 98.1 ?F (36.7 ?C), temperature source Oral, resp. rate 16, height 5\' 5"  (1.651 m), weight 125.6 kg, SpO2 100 %. ?Gen App: NAD, comfortable ?Abdomen: soft, gravid ?FHT: baseline 145 bpm.  Accels present.  Decels absent. moderate in degree variability.   ?Tocometer: difficult to detect ?Cervix: 1/40-50/-3 ?Extremities: Nontender, no edema. ? ?Pitocin: none ? ?Labs:  ? Latest Reference Range & Units 12/10/21 13:42 12/10/21 18:08  ?Glucose-Capillary 70 - 99 mg/dL 79 95  ? ? ?Assessment:  ?1: SIUP at [redacted]w[redacted]d ?2. Severe IUGR ?3. Morbid obesity ?4. Gestational DM ?5. cHTN ? ?Plan:  ?1. Continue induction of labor. For 3rd dose of Cytotec, placed.  Foley bulb placed with incidental rupture of membranes.  ?2. Continue accuchecks q 4 hours ?3. Continue Procardia for HTN.  ?4. Desires epidural placement.  ? ? ?[redacted]w[redacted]d, MD ?12/10/2021 6:02 PM ? ? ?

## 2021-12-10 NOTE — Anesthesia Preprocedure Evaluation (Signed)
Anesthesia Evaluation  ?Patient identified by MRN, date of birth, ID band ?Patient awake ? ? ? ?Reviewed: ?Allergy & Precautions, H&P , NPO status , Patient's Chart, lab work & pertinent test results, reviewed documented beta blocker date and time  ? ?Airway ?Mallampati: II ? ?TM Distance: >3 FB ?Neck ROM: full ? ? ? Dental ?no notable dental hx. ?(+) Teeth Intact ?  ?Pulmonary ?Patient abstained from smoking., former smoker,  ?  ?Pulmonary exam normal ?breath sounds clear to auscultation ? ? ? ? ? ? Cardiovascular ?Exercise Tolerance: Good ?hypertension, On Medications ?negative cardio ROS ? ? ?Rhythm:regular Rate:Normal ? ? ?  ?Neuro/Psych ?Seizures -, Well Controlled,  PSYCHIATRIC DISORDERS Anxiety Depression   ? GI/Hepatic ?negative GI ROS, Neg liver ROS,   ?Endo/Other  ?diabetes, Well Controlled, GestationalMorbid obesity ? Renal/GU ?  ? ?  ?Musculoskeletal ? ? Abdominal ?  ?Peds ? Hematology ?negative hematology ROS ?(+)   ?Anesthesia Other Findings ? ? Reproductive/Obstetrics ?(+) Pregnancy ? ?  ? ? ? ? ? ? ? ? ? ? ? ? ? ?  ?  ? ? ? ? ? ? ? ? ?Anesthesia Physical ?Anesthesia Plan ? ?ASA: 3 ? ?Anesthesia Plan: Epidural  ? ?Post-op Pain Management:   ? ?Induction:  ? ?PONV Risk Score and Plan:  ? ?Airway Management Planned:  ? ?Additional Equipment:  ? ?Intra-op Plan:  ? ?Post-operative Plan:  ? ?Informed Consent: I have reviewed the patients History and Physical, chart, labs and discussed the procedure including the risks, benefits and alternatives for the proposed anesthesia with the patient or authorized representative who has indicated his/her understanding and acceptance.  ? ? ? ? ? ?Plan Discussed with:  ? ?Anesthesia Plan Comments:   ? ? ? ? ? ? ?Anesthesia Quick Evaluation ? ?

## 2021-12-10 NOTE — Anesthesia Procedure Notes (Signed)
Epidural ?Patient location during procedure: OB ? ?Staffing ?Performed: anesthesiologist  ? ?Preanesthetic Checklist ?Completed: patient identified, IV checked, site marked, risks and benefits discussed, surgical consent, monitors and equipment checked, pre-op evaluation and timeout performed ? ?Epidural ?Patient position: sitting ?Prep: Betadine ?Patient monitoring: heart rate, continuous pulse ox and blood pressure ?Approach: midline ?Location: L3-L4 ?Injection technique: LOR saline ? ?Needle:  ?Needle type: Tuohy  ?Needle gauge: 17 G ?Needle length: 9 cm and 9 ?Needle insertion depth: 8 cm ?Catheter type: closed end flexible ?Catheter size: 19 Gauge ?Catheter at skin depth: 14 cm ?Test dose: negative and 1.5% lidocaine with Epi 1:200 K ? ?Assessment ?Sensory level: T10 ?Events: blood not aspirated, injection not painful, no injection resistance, no paresthesia and negative IV test ? ?Additional Notes ? ? ?Patient tolerated the insertion well without complications.-SATD -IVTD. No paresthesia. Refer to [facility] nursing for VS and dosingReason for block:procedure for pain ? ? ? ?

## 2021-12-10 NOTE — Progress Notes (Signed)
RN to bedside to adjust EFM. Pt sitting on the commode actively vomiting. Pt stated that she is most comfortable sitting in that position. RN asked if pt would like zofran for nausea. Pt declines zofran stating that she doesn't get nausea relief with zofran when nausea is caused by pain.  ?

## 2021-12-11 ENCOUNTER — Encounter: Payer: Self-pay | Admitting: Anesthesiology

## 2021-12-11 ENCOUNTER — Encounter
Admission: RE | Disposition: A | Discharge status: Home / Self Care | Payer: Self-pay | Source: Ambulatory Visit | Attending: Obstetrics and Gynecology

## 2021-12-11 ENCOUNTER — Encounter: Payer: Self-pay | Admitting: Obstetrics and Gynecology

## 2021-12-11 DIAGNOSIS — O365930 Maternal care for other known or suspected poor fetal growth, third trimester, not applicable or unspecified: Secondary | ICD-10-CM

## 2021-12-11 DIAGNOSIS — O2343 Unspecified infection of urinary tract in pregnancy, third trimester: Secondary | ICD-10-CM

## 2021-12-11 DIAGNOSIS — B951 Streptococcus, group B, as the cause of diseases classified elsewhere: Secondary | ICD-10-CM

## 2021-12-11 DIAGNOSIS — O9882 Other maternal infectious and parasitic diseases complicating childbirth: Secondary | ICD-10-CM

## 2021-12-11 DIAGNOSIS — O99214 Obesity complicating childbirth: Secondary | ICD-10-CM

## 2021-12-11 DIAGNOSIS — O10019 Pre-existing essential hypertension complicating pregnancy, unspecified trimester: Secondary | ICD-10-CM

## 2021-12-11 DIAGNOSIS — O1002 Pre-existing essential hypertension complicating childbirth: Secondary | ICD-10-CM

## 2021-12-11 DIAGNOSIS — Z3A37 37 weeks gestation of pregnancy: Secondary | ICD-10-CM

## 2021-12-11 DIAGNOSIS — O24410 Gestational diabetes mellitus in pregnancy, diet controlled: Secondary | ICD-10-CM

## 2021-12-11 LAB — GLUCOSE, CAPILLARY
Glucose-Capillary: 111 mg/dL — ABNORMAL HIGH (ref 70–99)
Glucose-Capillary: 93 mg/dL (ref 70–99)
Glucose-Capillary: 113 mg/dL — ABNORMAL HIGH (ref 70–99)
Glucose-Capillary: 128 mg/dL — ABNORMAL HIGH (ref 70–99)
Glucose-Capillary: 89 mg/dL (ref 70–99)

## 2021-12-11 SURGERY — Surgical Case
Anesthesia: Epidural

## 2021-12-11 MED ORDER — HYDRALAZINE HCL 20 MG/ML IJ SOLN: 10.0000 mg | INTRAMUSCULAR | Status: DC | PRN

## 2021-12-11 MED ORDER — KETOROLAC TROMETHAMINE 30 MG/ML IJ SOLN: 30.0000 mg | Freq: Four times a day (QID) | INTRAMUSCULAR | Status: AC

## 2021-12-11 MED ORDER — OXYTOCIN-SODIUM CHLORIDE 30-0.9 UT/500ML-% IV SOLN
INTRAVENOUS | Status: DC | PRN
  Administered 2021-12-11: 500 mL via INTRAVENOUS

## 2021-12-11 MED ORDER — FERROUS SULFATE 325 (65 FE) MG PO TABS
325.0000 mg | ORAL_TABLET | ORAL | Status: DC
  Administered 2021-12-12 – 2021-12-14 (×2): 325 mg via ORAL
  Filled 2021-12-11 (×2): qty 1

## 2021-12-11 MED ORDER — BUPIVACAINE-MELOXICAM ER 400-12 MG/14ML IJ SOLN
400.0000 mg | Freq: Once | INTRAMUSCULAR | Status: DC
  Filled 2021-12-11: qty 1

## 2021-12-11 MED ORDER — WITCH HAZEL-GLYCERIN EX PADS: 1.0000 "application " | MEDICATED_PAD | CUTANEOUS | Status: DC | PRN

## 2021-12-11 MED ORDER — ONDANSETRON HCL 4 MG/2ML IJ SOLN: 4.0000 mg | Freq: Three times a day (TID) | INTRAMUSCULAR | Status: DC | PRN

## 2021-12-11 MED ORDER — BUPIVACAINE-MELOXICAM ER 200-6 MG/7ML IJ SOLN
INTRAMUSCULAR | Status: DC | PRN
  Administered 2021-12-11: 400 mg

## 2021-12-11 MED ORDER — COCONUT OIL OIL: 1.0000 "application " | TOPICAL_OIL | Status: DC | PRN

## 2021-12-11 MED ORDER — ACETAMINOPHEN 500 MG PO TABS
1000.0000 mg | ORAL_TABLET | Freq: Four times a day (QID) | ORAL | Status: AC
  Administered 2021-12-12 (×4): 1000 mg via ORAL
  Filled 2021-12-11 (×4): qty 2

## 2021-12-11 MED ORDER — TRAMADOL HCL 50 MG PO TABS: 50.0000 mg | ORAL_TABLET | Freq: Four times a day (QID) | ORAL | Status: DC | PRN

## 2021-12-11 MED ORDER — PHENYLEPHRINE HCL-NACL 20-0.9 MG/250ML-% IV SOLN
INTRAVENOUS | Status: DC | PRN
  Administered 2021-12-11: 10 ug/min via INTRAVENOUS

## 2021-12-11 MED ORDER — CEFAZOLIN IN SODIUM CHLORIDE 3-0.9 GM/100ML-% IV SOLN
3.0000 g | INTRAVENOUS | Status: AC
  Administered 2021-12-11: 3 g via INTRAVENOUS
  Filled 2021-12-11: qty 100

## 2021-12-11 MED ORDER — OXYTOCIN-SODIUM CHLORIDE 30-0.9 UT/500ML-% IV SOLN: 2.5000 [IU]/h | INTRAVENOUS | Status: AC

## 2021-12-11 MED ORDER — PROPOFOL 10 MG/ML IV BOLUS
INTRAVENOUS | Status: AC
  Filled 2021-12-11: qty 20

## 2021-12-11 MED ORDER — SODIUM CHLORIDE 0.9% FLUSH: 3.0000 mL | INTRAVENOUS | Status: DC | PRN

## 2021-12-11 MED ORDER — SCOPOLAMINE 1 MG/3DAYS TD PT72: 1.0000 | MEDICATED_PATCH | Freq: Once | TRANSDERMAL | Status: DC

## 2021-12-11 MED ORDER — NALOXONE HCL 0.4 MG/ML IJ SOLN: 0.4000 mg | INTRAMUSCULAR | Status: DC | PRN

## 2021-12-11 MED ORDER — MENTHOL 3 MG MT LOZG: 1.0000 | LOZENGE | OROMUCOSAL | Status: DC | PRN

## 2021-12-11 MED ORDER — LABETALOL HCL 5 MG/ML IV SOLN: 80.0000 mg | INTRAVENOUS | Status: DC | PRN

## 2021-12-11 MED ORDER — SIMETHICONE 80 MG PO CHEW
80.0000 mg | CHEWABLE_TABLET | ORAL | Status: DC | PRN
  Administered 2021-12-12 – 2021-12-13 (×5): 80 mg via ORAL
  Filled 2021-12-11 (×5): qty 1

## 2021-12-11 MED ORDER — LACTATED RINGERS IV SOLN: INTRAVENOUS | Status: DC

## 2021-12-11 MED ORDER — FENTANYL CITRATE (PF) 100 MCG/2ML IJ SOLN
INTRAMUSCULAR | Status: AC
  Filled 2021-12-11: qty 2

## 2021-12-11 MED ORDER — ZOLPIDEM TARTRATE 5 MG PO TABS: 5.0000 mg | ORAL_TABLET | Freq: Every evening | ORAL | Status: DC | PRN

## 2021-12-11 MED ORDER — SENNOSIDES-DOCUSATE SODIUM 8.6-50 MG PO TABS
2.0000 | ORAL_TABLET | Freq: Every day | ORAL | Status: DC
  Administered 2021-12-12 – 2021-12-14 (×3): 2 via ORAL
  Filled 2021-12-11 (×3): qty 2

## 2021-12-11 MED ORDER — FENTANYL CITRATE (PF) 100 MCG/2ML IJ SOLN
INTRAMUSCULAR | Status: DC | PRN
  Administered 2021-12-11 (×2): 50 ug via EPIDURAL

## 2021-12-11 MED ORDER — KETOROLAC TROMETHAMINE 30 MG/ML IJ SOLN
INTRAMUSCULAR | Status: DC | PRN
  Administered 2021-12-11: 30 mg via INTRAVENOUS

## 2021-12-11 MED ORDER — LABETALOL HCL 5 MG/ML IV SOLN
INTRAVENOUS | Status: AC
Start: 2021-12-11 — End: 2021-12-11
  Administered 2021-12-11: 20 mg
  Filled 2021-12-11: qty 4

## 2021-12-11 MED ORDER — KETOROLAC TROMETHAMINE 30 MG/ML IJ SOLN
30.0000 mg | Freq: Four times a day (QID) | INTRAMUSCULAR | Status: AC
  Administered 2021-12-12 (×3): 30 mg via INTRAVENOUS
  Filled 2021-12-11 (×3): qty 1

## 2021-12-11 MED ORDER — LABETALOL HCL 5 MG/ML IV SOLN: 20.0000 mg | INTRAVENOUS | Status: DC | PRN

## 2021-12-11 MED ORDER — METHYLERGONOVINE MALEATE 0.2 MG/ML IJ SOLN
INTRAMUSCULAR | Status: AC
  Filled 2021-12-11: qty 1

## 2021-12-11 MED ORDER — OXYCODONE HCL 5 MG PO TABS
5.0000 mg | ORAL_TABLET | Freq: Four times a day (QID) | ORAL | Status: DC | PRN
  Administered 2021-12-12 – 2021-12-14 (×8): 5 mg via ORAL
  Filled 2021-12-11 (×8): qty 1

## 2021-12-11 MED ORDER — DIPHENHYDRAMINE HCL 25 MG PO CAPS: 25.0000 mg | ORAL_CAPSULE | ORAL | Status: DC | PRN

## 2021-12-11 MED ORDER — HYDROMORPHONE HCL 1 MG/ML IJ SOLN: 1.0000 mg | INTRAMUSCULAR | Status: DC | PRN

## 2021-12-11 MED ORDER — MORPHINE SULFATE (PF) 0.5 MG/ML IJ SOLN: INTRAMUSCULAR | Status: DC | PRN

## 2021-12-11 MED ORDER — SOD CITRATE-CITRIC ACID 500-334 MG/5ML PO SOLN
ORAL | Status: AC
Start: 2021-12-11 — End: 2021-12-11
  Administered 2021-12-11: 30 mL via ORAL
  Filled 2021-12-11: qty 15

## 2021-12-11 MED ORDER — PRENATAL MULTIVITAMIN CH
1.0000 | ORAL_TABLET | Freq: Every day | ORAL | Status: DC
  Administered 2021-12-12 – 2021-12-14 (×3): 1 via ORAL
  Filled 2021-12-11 (×3): qty 1

## 2021-12-11 MED ORDER — ONDANSETRON HCL 4 MG/2ML IJ SOLN
INTRAMUSCULAR | Status: DC | PRN
  Administered 2021-12-11: 4 mg via INTRAVENOUS

## 2021-12-11 MED ORDER — SOD CITRATE-CITRIC ACID 500-334 MG/5ML PO SOLN: 30.0000 mL | ORAL | Status: AC

## 2021-12-11 MED ORDER — KETOROLAC TROMETHAMINE 30 MG/ML IJ SOLN
INTRAMUSCULAR | Status: AC
  Filled 2021-12-11: qty 1

## 2021-12-11 MED ORDER — DIPHENHYDRAMINE HCL 50 MG/ML IJ SOLN: 12.5000 mg | INTRAMUSCULAR | Status: DC | PRN

## 2021-12-11 MED ORDER — MEPERIDINE HCL 25 MG/ML IJ SOLN: 6.2500 mg | INTRAMUSCULAR | Status: DC | PRN

## 2021-12-11 MED ORDER — OXYTOCIN-SODIUM CHLORIDE 30-0.9 UT/500ML-% IV SOLN
1.0000 m[IU]/min | INTRAVENOUS | Status: DC
  Administered 2021-12-11: 2 m[IU]/min via INTRAVENOUS

## 2021-12-11 MED ORDER — LIDOCAINE HCL (PF) 2 % IJ SOLN
INTRAMUSCULAR | Status: DC | PRN
  Administered 2021-12-11 (×3): 100 mg via EPIDURAL

## 2021-12-11 MED ORDER — MISOPROSTOL 200 MCG PO TABS
ORAL_TABLET | ORAL | Status: AC
  Filled 2021-12-11: qty 5

## 2021-12-11 MED ORDER — MAGNESIUM HYDROXIDE 400 MG/5ML PO SUSP: 30.0000 mL | ORAL | Status: DC | PRN

## 2021-12-11 MED ORDER — NALOXONE HCL 4 MG/10ML IJ SOLN
1.0000 ug/kg/h | INTRAVENOUS | Status: DC | PRN
  Filled 2021-12-11: qty 5

## 2021-12-11 MED ORDER — DIBUCAINE (PERIANAL) 1 % EX OINT: 1.0000 "application " | TOPICAL_OINTMENT | CUTANEOUS | Status: DC | PRN

## 2021-12-11 MED ORDER — IBUPROFEN 600 MG PO TABS
600.0000 mg | ORAL_TABLET | Freq: Four times a day (QID) | ORAL | Status: DC
  Administered 2021-12-12 – 2021-12-14 (×7): 600 mg via ORAL
  Filled 2021-12-11 (×7): qty 1

## 2021-12-11 MED ORDER — SODIUM CHLORIDE 0.9 % IV SOLN
500.0000 mg | INTRAVENOUS | Status: AC
  Administered 2021-12-11: 500 mg via INTRAVENOUS
  Filled 2021-12-11: qty 5

## 2021-12-11 MED ORDER — LABETALOL HCL 5 MG/ML IV SOLN
40.0000 mg | INTRAVENOUS | Status: DC | PRN
  Administered 2021-12-11: 40 mg via INTRAVENOUS
  Filled 2021-12-11: qty 8

## 2021-12-11 MED ORDER — GABAPENTIN 300 MG PO CAPS
300.0000 mg | ORAL_CAPSULE | Freq: Two times a day (BID) | ORAL | Status: DC
  Administered 2021-12-12 – 2021-12-14 (×6): 300 mg via ORAL
  Filled 2021-12-11 (×6): qty 1

## 2021-12-11 MED ORDER — PHENYLEPHRINE HCL-NACL 20-0.9 MG/250ML-% IV SOLN
INTRAVENOUS | Status: AC
  Filled 2021-12-11: qty 250

## 2021-12-11 MED ORDER — CARBOPROST TROMETHAMINE 250 MCG/ML IM SOLN
INTRAMUSCULAR | Status: AC
  Filled 2021-12-11: qty 1

## 2021-12-11 SURGICAL SUPPLY — 38 items
BACTOSHIELD CHG 4% 4OZ (MISCELLANEOUS) ×1
BAG COUNTER SPONGE SURGICOUNT (BAG) ×2 IMPLANT
CELL SAVER LIPIGURD (MISCELLANEOUS) ×1 IMPLANT
CHLORAPREP W/TINT 26 (MISCELLANEOUS) ×4 IMPLANT
DERMABOND ADHESIVE PROPEN (GAUZE/BANDAGES/DRESSINGS) ×1
DERMABOND ADVANCED .7 DNX6 (GAUZE/BANDAGES/DRESSINGS) IMPLANT
DEVICE RETRIEVAL ALEXIS 14 (MISCELLANEOUS) IMPLANT
DRAPE C SECTION CLR SCREEN (DRAPES) ×1 IMPLANT
DRSG TELFA 3X8 NADH (GAUZE/BANDAGES/DRESSINGS) ×2 IMPLANT
ELECT REM PT RETURN 9FT ADLT (ELECTROSURGICAL) ×2
ELECTRODE REM PT RTRN 9FT ADLT (ELECTROSURGICAL) ×1 IMPLANT
EXTRT SYSTEM ALEXIS 14CM (MISCELLANEOUS) ×2
EXTRT SYSTEM ALEXIS 17CM (MISCELLANEOUS)
GAUZE SPONGE 4X4 12PLY STRL (GAUZE/BANDAGES/DRESSINGS) ×2 IMPLANT
GLOVE BIO SURGEON STRL SZ 6.5 (GLOVE) ×2 IMPLANT
GLOVE SURG UNDER LTX SZ7 (GLOVE) ×2 IMPLANT
GOWN STRL REUS W/ TWL LRG LVL3 (GOWN DISPOSABLE) ×2 IMPLANT
GOWN STRL REUS W/TWL LRG LVL3 (GOWN DISPOSABLE) ×4
KIT TURNOVER KIT A (KITS) ×2 IMPLANT
MANIFOLD NEPTUNE II (INSTRUMENTS) ×2 IMPLANT
MAT PREVALON FULL STRYKER (MISCELLANEOUS) ×2 IMPLANT
NS IRRIG 1000ML POUR BTL (IV SOLUTION) ×2 IMPLANT
PACK C SECTION AR (MISCELLANEOUS) ×2 IMPLANT
PAD DRESSING TELFA 3X8 NADH (GAUZE/BANDAGES/DRESSINGS) ×1 IMPLANT
PAD OB MATERNITY 4.3X12.25 (PERSONAL CARE ITEMS) ×2 IMPLANT
PAD PREP 24X41 OB/GYN DISP (PERSONAL CARE ITEMS) ×2 IMPLANT
RETRACTOR TRAXI PANNICULUS (MISCELLANEOUS) IMPLANT
SCRUB CHG 4% DYNA-HEX 4OZ (MISCELLANEOUS) ×1 IMPLANT
SUT MNCRL AB 4-0 PS2 18 (SUTURE) ×2 IMPLANT
SUT PLAIN 2 0 XLH (SUTURE) IMPLANT
SUT VIC AB 0 CT1 36 (SUTURE) ×8 IMPLANT
SUT VIC AB 2-0 CT1 27 (SUTURE) ×1
SUT VIC AB 2-0 CT1 TAPERPNT 27 (SUTURE) IMPLANT
SUT VIC AB 3-0 SH 27 (SUTURE) ×1
SUT VIC AB 3-0 SH 27X BRD (SUTURE) ×1 IMPLANT
SYSTEM CONTND EXTRCTN KII BLLN (MISCELLANEOUS) IMPLANT
TRAXI PANNICULUS RETRACTOR (MISCELLANEOUS) ×1
WATER STERILE IRR 500ML POUR (IV SOLUTION) ×2 IMPLANT

## 2021-12-11 NOTE — Discharge Summary (Signed)
? ? ?  L&D OB Triage Note ? ?SUBJECTIVE ?Lori Henry is a 26 y.o. G1P0 female at [redacted]w[redacted]d, EDD Estimated Date of Delivery: 12/31/21 who presented to triage with complaints of contractions.  Denies ROM bleeding. ? ?OB History  ?Gravida Para Term Preterm AB Living  ?1 0 0 0 0 0  ?SAB IAB Ectopic Multiple Live Births  ?0 0 0 0 0  ?  ?# Outcome Date GA Lbr Len/2nd Weight Sex Delivery Anes PTL Lv  ?1 Current           ? ? ?Medications Prior to Admission  ?Medication Sig Dispense Refill Last Dose  ? NIFEdipine (PROCARDIA-XL/NIFEDICAL-XL) 30 MG 24 hr tablet Take 1 tablet (30 mg total) by mouth daily. Can increase to twice a day as needed for elevated blood pressures 30 tablet 2 12/07/2021  ? nitrofurantoin, macrocrystal-monohydrate, (MACROBID) 100 MG capsule Take 1 capsule (100 mg total) by mouth 2 (two) times daily. 14 capsule 1   ? Prenatal MV-Min-FA-Omega-3 (PRENATAL GUMMIES/DHA & FA) 0.4-32.5 MG CHEW Chew 2 tablets by mouth daily. 90 tablet 3 12/06/2021  ? aspirin EC 81 MG tablet Take 81 mg by mouth daily. Swallow whole. (Patient not taking: Reported on 12/10/2021)   12/06/2021  ? Blood Pressure Monitoring (BLOOD PRESSURE KIT) DEVI 1 kit by Does not apply route daily. (Patient not taking: Reported on 12/07/2021) 1 each 0 Not Taking  ? Butalbital-APAP-Caffeine 50-325-40 MG capsule Take 1-2 capsules by mouth every 6 (six) hours as needed for headache. 30 capsule 0 Past Week  ? Glucose Blood (BLOOD GLUCOSE TEST STRIPS) STRP 100 each by In Vitro route daily. 100 strip 1 Past Week  ? ? ? OBJECTIVE ? Nursing Evaluation: ?  Temp 98.7 ?F (37.1 ?C) (Oral)   Resp 18   Ht $R'5\' 5"'JT$  (1.651 m)   Wt 122.7 kg   LMP  (LMP Unknown)   BMI 45.03 kg/m?  ?  Findings:   Not in labor ?     ?    ? ?NST was performed and has been reviewed by me. ? ?NST INTERPRETATION: ?Category I ? ?Mode: External ?Baseline Rate (A): 135 bpm ?Variability: Moderate ?Accelerations: 15 x 15 ?Decelerations: None ?  ?  ?Contraction Frequency (min):  none ? ?ASSESSMENT ?Impression:  ?1.  Pregnancy:  G1P0 at [redacted]w[redacted]d , EDD Estimated Date of Delivery: 12/31/21 ?2.  Reassuring fetal and maternal status ?3.  Patient found to be not in labor. ? ?PLAN ?1. Current condition and above findings reviewed.  Reassuring fetal and maternal condition. ?2. Discharge home with standard labor precautions given to return to L&D or call the office for problems. ?3. Continue routine prenatal care. ?   ? ?

## 2021-12-11 NOTE — Progress Notes (Signed)
Intrapartum Progress Note ? ?S: Patient without complaints.  ? ?O: Blood pressure (!) 141/76, pulse 79, temperature 98.3 ?F (36.8 ?C), temperature source Oral, resp. rate 16, height 5\' 5"  (1.651 m), weight 125.6 kg, SpO2 92 %. ?Gen App: NAD, comfortable ?Abdomen: soft, gravid ?FHT: baseline 140 bpm.  Accels present.  Decels absent. moderate in degree variability.   ?Tocometer: difficult to detect ?Cervix: foley bulb expelled on exam, 4/70/-1 ?Extremities: Nontender, no edema. ? ?Pitocin: 6 mIU ? ?Labs:  ? ? ?Assessment:  ?1: SIUP at [redacted]w[redacted]d ?2. Severe IUGR ?3. Morbid obesity ?4. Gestational DM ?5. cHTN ? ?Plan:  ?1. Continue induction of labor. MVUs currently adequate. AROM'd at 1300 ?2. Continue accuchecks q 4 hours ?3. Continue Procardia for HTN.  ? ? ?[redacted]w[redacted]d, MD ?12/11/2021 6:48 AM ? ? ? ? ?

## 2021-12-11 NOTE — Progress Notes (Signed)
Pt O2 sat 89%-92% on room air while sleeping. O2 site and probe changed.  ?

## 2021-12-11 NOTE — Transfer of Care (Signed)
Immediate Anesthesia Transfer of Care Note ? ?Patient: Lori Henry ? ?Procedure(s) Performed: CESAREAN SECTION ? ?Patient Location: LDR 4 ? ?Anesthesia Type:Epidural ? ?Level of Consciousness: awake, alert  and oriented ? ?Airway & Oxygen Therapy: Patient Spontanous Breathing ? ?Post-op Assessment: Report given to RN and Post -op Vital signs reviewed and stable ? ?Post vital signs: Reviewed and stable ? ?Last Vitals:  ?Vitals Value Taken Time  ?BP 143/90   ?Temp    ?Pulse 78   ?Resp 12   ?SpO2 94   ? ? ?Last Pain:  ?Vitals:  ? 12/11/21 1512  ?TempSrc: Oral  ?PainSc:   ?   ? ?Patients Stated Pain Goal: 0 (12/11/21 0800) ? ?Complications: No notable events documented. ?

## 2021-12-11 NOTE — Progress Notes (Signed)
Pt position changed- sat upright with pillows adjusted. O2 increased to 90-92% on room air when awake and not snoring. Pt educated on position changed and need to trace baby.  ?Pt reports pain 7/10 on left side of back when not sleeping- relieved by PCA dose from epidural.  ? ?

## 2021-12-11 NOTE — Op Note (Signed)
Cesarean Section Procedure Note ? ?Indications: failure to progress: arrest of dilation and non-reassuring fetal status ? ?Pre-operative Diagnosis: 37 week 1 day pregnancy, failure to progress (4 cm), non-reassuring feta tracing, severe IUGR, morbid obesity, (BMI 46) gestational diabetes (diet controlled), chronic hypertension in pregnancy . ? ?Post-operative Diagnosis: Same ? ?Surgeon: Hildred Laser, MD ? ?Assistants:  Paula Compton, CNM.   ? ?Procedure: Primary low transverse Cesarean Section ? ?Anesthesia: Epidural anesthesia ? ?Procedure Details: ?The patient was seen in the Holding Room. The risks, benefits, complications, treatment options, and expected outcomes were discussed with the patient.  The patient concurred with the proposed plan, giving informed consent.  The site of surgery properly noted/marked. The patient was taken to the Operating Room, identified as Lori Henry and the procedure verified as C-Section Delivery. A Time Out was held and the above information confirmed. ? ?After induction of anesthesia, the patient was draped and prepped in the usual sterile manner. Anesthesia was tested and noted to be adequate. A Pfannenstiel incision was made and carried down through the subcutaneous tissue to the fascia. Fascial incision was made and extended transversely. The fascia was separated from the underlying rectus tissue superiorly and inferiorly. The peritoneum was identified and entered. Peritoneal incision was extended longitudinally. The surgical assist was able to provide retraction to allow for clear visualization of surgical site. The utero-vesical peritoneal reflection was incised transversely and the bladder flap was bluntly freed from the lower uterine segment. A low transverse uterine incision was made. Delivered from cephalic presentation (direct OP position) was a 1990 gram Female with Apgar scores of 8 at one minute and 9 at five minutes.  The assistant was able to apply adequate  fundal pressure to allow for successful delivery of the fetus. After the umbilical cord was clamped and cut, cord blood was obtained for evaluation. The placenta was removed intact and appeared normal. The uterus was exteriorized and cleared of all clots and debris. The uterine outline, tubes and ovaries appeared normal.  The uterine incision was closed with running locked sutures of 0-Vicryl.  A second suture of 0-Vicryl was used in an imbricating layer.  Hemostasis was observed. The uterus was then returned to the abdomen. The pericolic gutters were cleared of all clots and debris. The peritoneum was grasped and reapproximated using 3-0 Vicryl . Five ml of Zynrelef was placed on the peritoneal surface. The fascia was then reapproximated with a running suture of 0-Vicryl. Another 9 ml of Zynrelef was applied to the fascial surface. The subcutaneous fat layer was reapproximated with 2-0 Vicryl in 2 layers. The skin was reapproximated with 4-0 Monocryl. The incision was covered with Dermabond.  ? ?An experienced assistant was required given the standard of surgical care given the complexity of the case.  This assistant was needed for exposure, dissection, suctioning, retraction, instrument exchange, and for overall help during the procedure. ? ?Instrument, sponge, and needle counts were correct prior the abdominal closure and at the conclusion of the case.  ? ?Findings: ?Female infant, cephalic presentation, 1990 grams, with Apgar scores of 8 at one minute and 9 at five minutes. ?Intact placenta with 3 vessel cord. Nuchal cord x 1, loose, reduced after delivery of fetal head.  ?Clear scant amniotic fluid at amniotomy ?The uterine outline, tubes and ovaries appeared normal.  ? ?Estimated Blood Loss:  488  ml ?     ?Drains: foley catheter to gravity drainage, 200 ml of clear urine at end of the procedure ?        ?  Total IV Fluids:  1300 ml ? ?Specimens: Placenta, sent to pathology ?        ?Implants: None ?         ?Complications:  None; patient tolerated the procedure well. ?        ?Disposition: PACU - hemodynamically stable. ?        ?Condition: stable ? ? ?Hildred Laser, MD ?[facility] ? ? ? ? ?

## 2021-12-11 NOTE — Progress Notes (Signed)
The risks of surgery were discussed with the patient including but were not limited to: bleeding which may require transfusion or reoperation; infection which may require antibiotics; injury to bowel, bladder, ureters or other surrounding organs; injury to the fetus; need for additional procedures including hysterectomy in the event of a life-threatening hemorrhage; formation of adhesions; placental abnormalities with subsequent pregnancies; incisional problems; thromboembolic phenomenon and other postoperative/anesthesia complications.  The patient concurred with the proposed plan, giving informed written consent for the procedure.   Patient has been on clear liquid diet since yesterday, she will remain NPO for procedure. Anesthesia and OR aware. Preoperative prophylactic antibiotics and SCDs ordered on call to the OR.  To OR when ready. ? ? ?Hildred Laser, MD ?[facility] ? ?

## 2021-12-11 NOTE — Progress Notes (Signed)
02 maintaining 90% and below on room air. Pt position changed and MD notified. Pt alert and oriented. ?2L 02 given via nasal cannula- 02 92%.  ?02 increased to 4L- 02 94%-96%.  ? ?

## 2021-12-11 NOTE — Anesthesia Procedure Notes (Signed)
Epidural ?Patient location during procedure: OB ?Start time: 12/11/2021 10:34 AM ?End time: 12/11/2021 10:39 AM ? ?Staffing ?Anesthesiologist: Corinda Gubler, MD ?Resident/CRNA: Hezzie Bump, CRNA ?Performed: other anesthesia staff  ? ?Preanesthetic Checklist ?Completed: patient identified, IV checked, site marked, risks and benefits discussed, surgical consent, monitors and equipment checked and pre-op evaluation ? ?Epidural ?Patient position: sitting ?Prep: ChloraPrep ?Patient monitoring: heart rate, continuous pulse ox and blood pressure ?Approach: midline ?Location: L3-L4 ?Injection technique: LOR saline ? ?Needle:  ?Needle type: Tuohy  ?Needle gauge: 17 G ?Needle length: 9 cm and 9 ?Needle insertion depth: 8 cm ?Catheter type: closed end flexible ?Catheter size: 19 Gauge ?Catheter at skin depth: 13 cm ?Test dose: negative and 1.5% lidocaine with Epi 1:200 K ? ?Assessment ?Sensory level: T10 ?Events: blood not aspirated, injection not painful, no injection resistance, no paresthesia and negative IV test ? ?Additional Notes ?1 attempt ?Pt. Evaluated and documentation done after procedure finished. ?Patient identified. Risks/Benefits/Options discussed with patient including but not limited to bleeding, infection, nerve damage, paralysis, failed block, incomplete pain control, headache, blood pressure changes, nausea, vomiting, reactions to medication both or allergic, itching and postpartum back pain. Confirmed with bedside nurse the patient's most recent platelet count. Confirmed with patient that they are not currently taking any anticoagulation, have any bleeding history or any family history of bleeding disorders. Patient expressed understanding and wished to proceed. All questions were answered. Sterile technique was used throughout the entire procedure. Please see nursing notes for vital signs. Test dose was given through epidural catheter and negative prior to continuing to dose epidural or start infusion.  Warning signs of high block given to the patient including shortness of breath, tingling/numbness in hands, complete motor block, or any concerning symptoms with instructions to call for help. Patient was given instructions on fall risk and not to get out of bed. All questions and concerns addressed with instructions to call with any issues or inadequate analgesia.   ? ?Patient tolerated the insertion well without immediate complications.Reason for block:procedure for pain ? ? ? ?

## 2021-12-11 NOTE — Progress Notes (Signed)
Intrapartum Progress Note ? ?S: Patient without complaints. Had epidural replaced this morning as patient was complaining of feeling her contractions.  ? ?O: Blood pressure (!) 133/91, pulse 97, temperature 98.2 ?F (36.8 ?C), temperature source Oral, resp. rate 16, height 5\' 5"  (1.651 m), weight 125.6 kg, SpO2 95 %. ?Gen App: NAD, comfortable ?Abdomen: soft, gravid ?FHT: baseline 145 bpm.  Accels present.  Decels present - recurrent late and variable . moderate in degree variability.   ?Tocometer: difficult to detect ?Cervix: 4-4.5/80/-1.  Some caput noted.  ?Extremities: Nontender, no edema. ? ?Pitocin: 10 mIU ? ?Labs:  ?Blood sugars reviewed, wnl.  ? ? ?Assessment:  ?1: SIUP at [redacted]w[redacted]d ?2. Severe IUGR ?3. Morbid obesity ?4. Gestational DM ?5. cHTN ? ?Plan:  ?1. Discussed with patient that based on Category II tracing, and minimal cervical change, would recommend proceeding to C-section delivery at this time.  MVUs currently adequate. Patient desires to give another 2 hours as some small change has been noted and she is more comfortable with her epidural.  ?2. Continue accuchecks q 4 hours ?3. Continue Procardia for HTN. Had 2 treatable BPs around 0800, given Labetalol IV.  ? ? ?Rubie Maid, MD ?12/11/2021 1:47 PM ? ? ? ? ?

## 2021-12-12 ENCOUNTER — Encounter: Payer: Self-pay | Admitting: Obstetrics and Gynecology

## 2021-12-12 LAB — URINE CULTURE

## 2021-12-12 LAB — CBC
HCT: 32.2 % — ABNORMAL LOW (ref 36.0–46.0)
Hemoglobin: 10.6 g/dL — ABNORMAL LOW (ref 12.0–15.0)
MCH: 27.2 pg (ref 26.0–34.0)
MCHC: 32.9 g/dL (ref 30.0–36.0)
MCV: 82.8 fL (ref 80.0–100.0)
Platelets: 270 10*3/uL (ref 150–400)
RBC: 3.89 MIL/uL (ref 3.87–5.11)
RDW: 15 % (ref 11.5–15.5)
WBC: 17.3 10*3/uL — ABNORMAL HIGH (ref 4.0–10.5)
nRBC: 0 % (ref 0.0–0.2)

## 2021-12-12 LAB — GLUCOSE, CAPILLARY: Glucose-Capillary: 93 mg/dL (ref 70–99)

## 2021-12-12 MED ORDER — ACETAMINOPHEN 500 MG PO TABS
ORAL_TABLET | ORAL | Status: AC
  Filled 2021-12-12: qty 1

## 2021-12-12 NOTE — Anesthesia Postprocedure Evaluation (Signed)
Anesthesia Post Note ? ?Patient: Lori Henry ? ?Procedure(s) Performed: CESAREAN SECTION ? ?Patient location during evaluation: Mother Baby ?Anesthesia Type: Epidural ?Level of consciousness: awake, awake and alert and oriented ?Pain management: pain level controlled ?Vital Signs Assessment: post-procedure vital signs reviewed and stable ?Respiratory status: spontaneous breathing and respiratory function stable ?Cardiovascular status: blood pressure returned to baseline and stable ?Postop Assessment: no headache and no backache ?Anesthetic complications: no ? ? ?No notable events documented. ? ? ?Last Vitals:  ?Vitals:  ? 12/12/21 0603 12/12/21 0747  ?BP:  129/79  ?Pulse:  93  ?Resp:  20  ?Temp:  36.7 ?C  ?SpO2: 96% 98%  ?  ?Last Pain:  ?Vitals:  ? 12/12/21 0802  ?TempSrc:   ?PainSc: 8   ? ? ?  ?  ?  ?  ?  ?  ? ?[name] ? ? ? ? ?

## 2021-12-12 NOTE — Progress Notes (Signed)
Postpartum Day # 1: Cesarean Delivery. Pregnancy complicated by severe IUGR, cHTN (on Procardia) and GDM (diet). ? ?Subjective: ?Patient reports tolerating PO and + flatus.  Is trying to pump.  Has visited her baby in the nursery and is overjoyed. Reports bleeding is light. Pain is well controlled.  ? ?Objective: ?Vital signs in last 24 hours: ?Temp:  [97.8 ?F (36.6 ?C)-98.7 ?F (37.1 ?C)] 98 ?F (36.7 ?C) (05/12 0747) ?Pulse Rate:  [74-103] 93 (05/12 0747) ?Resp:  [14-28] 20 (05/12 0747) ?BP: (107-158)/(65-117) 129/79 (05/12 0747) ?SpO2:  [91 %-98 %] 98 % (05/12 0747) ? ?Physical Exam:  ?General: alert and no distress ?Lungs: clear to auscultation bilaterally ?Breasts: normal appearance, no masses or tenderness ?Heart: regular rate and rhythm, S1, S2 normal, no murmur, click, rub or gallop ?Abdomen: soft, non-tender; bowel sounds normal; no masses,  no organomegaly ?Pelvis: Lochia appropriate, Uterine Fundus firm, Incision: healing well, no significant drainage, no dehiscence, no significant erythema. Honeycomb dressing in place.  ?Extremities: DVT Evaluation: No evidence of DVT seen on physical exam. Negative Homan's sign. ?No cords or calf tenderness. No significant calf/ankle edema.  ? ?Recent Labs  ?  12/10/21 ?0820 12/12/21 ?0646  ?HGB 13.1 10.6*  ?HCT 40.2 32.2*  ? ? ? Latest Reference Range & Units 12/12/21 06:45  ?Glucose-Capillary 70 - 99 mg/dL 93  ? ? ?Assessment/Plan: ?Status post Cesarean section. Doing well postoperatively.  ?Breastfeeding and Lactation consult. Will also supplement with formula due to infant's weight ?Regular diet ?Continue PO pain management ?Remove foley catheter ?Discontinue IVF ?Continue Procardia for cHTN ?GDM, diet controlled. Fasting glucose this morning borderline (however after discussion, patient had not been completely fasting overnight) ?Continue current care. ? ? ?Hildred Laser, MD ?[facility] ? ? ? ? ? ?

## 2021-12-12 NOTE — TOC Initial Note (Signed)
Transition of Care (TOC) - Initial/Assessment Note  ? ? ?Patient Details  ?Name: Lori Henry ?MRN: [id number] ?Date of Birth: 06-Sep-1995 ? ?Transition of Care (TOC) CM/SW Contact:    ?[place] Cellar, RN ?Phone Number: ?12/12/2021, 10:17 AM ? ?Clinical Narrative:                 ?Spoke with patient and friend at bedside. Patient very pleasant and excited about being a mother. Reports she lives in a hotel which they are able to pay for on a weekly basis. Has food stamps and WIC services. Patient has all needed equipment for infant however is concerned about infant being to small for car seat. TOC advised mother to bring car seat to hospital at time of discharge and staff will do car seat check. Patient currently planning to breastfeed/bottlefeed due to infant requiring placement in SCN. Plans to talk to her [facility] case manager regarding pediatrician choices. Has strong support system in the community and discussed PPD and resources if needed. Reports no transportation needs as [employer] is able to assist with that. FOB is currently at work but assists as needed with care. TOC will continue to follow for additional needs. WIC case manager is also following in the community post discharge. ? ?  ?  ? ? ?Patient Goals and CMS Choice ?  ?  ?  ? ?Expected Discharge Plan and Services ?  ?  ?  ?  ?  ?                ?  ?  ?  ?  ?  ?  ?  ?  ?  ?  ? ?Prior Living Arrangements/Services ?  ?  ?  ?       ?  ?  ?  ?  ? ?Activities of Daily Living ?Home Assistive Devices/Equipment: None ?ADL Screening (condition at time of admission) ?Patient's cognitive ability adequate to safely complete daily activities?: Yes ?Is the patient deaf or have difficulty hearing?: No ?Does the patient have difficulty seeing, even when wearing glasses/contacts?: No ?Does the patient have difficulty concentrating, remembering, or making decisions?: No ?Patient able to express need for assistance with ADLs?: Yes ?Does the patient have difficulty dressing  or bathing?: No ?Independently performs ADLs?: Yes (appropriate for developmental age) ?Does the patient have difficulty walking or climbing stairs?: No ?Weakness of Legs: None ?Weakness of Arms/Hands: None ? ?Permission Sought/Granted ?  ?  ?   ?   ?   ?   ? ?Emotional Assessment ?  ?  ?  ?  ?  ?  ? ?Admission diagnosis:  IUGR (intrauterine growth restriction) affecting care of mother [O36.5990] ?Patient Active Problem List  ? Diagnosis Date Noted  ? Pre-existing essential hypertension during pregnancy, antepartum   ? Morbid obesity with BMI of 45.0-49.9, adult (HCC)   ? IUGR (intrauterine growth restriction) affecting care of mother 12/10/2021  ? Pregnancy affected by fetal growth restriction 12/09/2021  ? Decreased fetal movement 11/20/2021  ? Indication for care in labor or delivery 10/29/2021  ? Maternal varicella, non-immune 10/10/2021  ? Gestational diabetes mellitus 10/10/2021  ? Fall 10/04/2021  ? History of kidney stones 09/27/2021  ? History of recurrent UTIs 09/27/2021  ? Vaginal spotting   ? Indication for care in labor and delivery, antepartum 09/26/2021  ? Chronic hypertension with exacerbation during pregnancy in second trimester 09/26/2021  ? Abdominal pain during pregnancy in second trimester   ? Pregnancy 09/04/2021  ?  Essential hypertension 08/15/2019  ? PTSD (post-traumatic stress disorder) 08/15/2019  ? MDD (major depressive disorder) 08/14/2019  ? Panic attack   ? ?PCP:  [facility] ?Pharmacy:   ?[facility] [address], [place] - [address] ?[address] ?[place] [address] ?Phone: [phone] Fax: [phone] ? ? ? ? ?Social Determinants of Health (SDOH) Interventions ?  ? ?Readmission Risk Interventions ?   ? View : No data to display.  ?  ?  ?  ? ? ? ?

## 2021-12-12 NOTE — Lactation Note (Signed)
This note was copied from a baby's chart. ?Lactation Consultation Note ? ?Patient Name: Lori Henry ?Today's Date: 12/12/2021 ?Reason for consult: Follow-up assessment;Primapara;NICU baby;Early term 37-38.6wks;Infant < 6lbs ?Age:26 hours ? ?Maternal Data ?Does the patient have breastfeeding experience prior to this delivery?: No ? ?Feeding ?Mother's Current Feeding Choice: Breast Milk and Formula ?Mom has not breastfed since delivery, offered to assist in SCN when interested, mom has been pumping breasts with DEBP a few times but not consistently, twice today, encouraged mom to pump q 3hr and call for assist as needed.  SHe is currently talking on the phone, facetime. She states she wants to pump and bottlefeed and breastfeed.  She does not have a pump for use after d/c, except manual pump, she is on [facility] and I encouraged her call WIC today and also gave her the phone no for [facility]: Lambert Rhonda W, she will be living in a hotel room with FOB after discharge.     ?LATCH Score ?  ? ?  ? ?  ? ?  ? ?  ? ?  ? ? ?Lactation Tools Discussed/Used ?Tools: Pump ?Breast pump type: Double-Electric Breast Pump ?Reason for Pumping: baby in SCN ?Pumping frequency: encouraged to pump q 3hrs ?[facility] faxed referral sheet ?Interventions ?Interventions: DEBP ?LC name and no written on white board ?Discharge ?Pump: DEBP ?WIC Program: Yes ? ?Consult Status ?Consult Status: PRN ? ? ? ?Dyann Kief ?12/12/2021, 3:39 PM ? ? ? ?

## 2021-12-13 NOTE — Progress Notes (Signed)
Subjective: ?Postpartum Day 2: Cesarean Delivery ?Patient reports tolerating PO, + flatus, and no problems voiding.  She verbalizes her pain is well controlled with po medication. Very happy with her baby , who is in the NICU. ? ?Objective: ?Vital signs in last 24 hours: ?Temp:  [97.7 ?F (36.5 ?C)-98.5 ?F (36.9 ?C)] 97.8 ?F (36.6 ?C) (05/13 [id number]) ?Pulse Rate:  [88-98] 92 (05/13 0909) ?Resp:  [18-20] 20 (05/13 0909) ?BP: (111-133)/(68-89) 120/78 (05/13 [id number]) ?SpO2:  [98 %-100 %] 98 % (05/13 0909) ? ?Physical Exam:  ?General: alert, cooperative, no distress, and morbidly obese ?Lochia: appropriate ?Uterine Fundus: firm ?Incision: healing well, no significant drainage, honeycomb dressing is C, D, I ?DVT Evaluation: No evidence of DVT seen on physical exam. ?Negative Homan's sign. ? ?Recent Labs  ?  12/12/21 ?0646  ?HGB 10.6*  ?HCT 32.2*  ? ? ?Assessment/Plan: ?Status post Cesarean section. Doing well postoperatively.  ?Continue current care ?Anticipate discharge tomorrow.Marland Kitchen ?Continues on the Procardia. ?Ambulation encouraged today. ? ?Lori Henry ?12/13/2021, 11:16 AM ? ? ?

## 2021-12-14 MED ORDER — OXYCODONE HCL 5 MG PO TABS: 5.0000 mg | ORAL_TABLET | Freq: Four times a day (QID) | ORAL | 0 refills | Status: AC | PRN

## 2021-12-14 MED ORDER — COCONUT OIL OIL: 1.0000 "application " | TOPICAL_OIL | 0 refills | Status: DC | PRN

## 2021-12-14 MED ORDER — POLYETHYLENE GLYCOL 3350 17 G PO PACK
17.0000 g | PACK | Freq: Once | ORAL | Status: AC
  Administered 2021-12-14: 17 g via ORAL
  Filled 2021-12-14: qty 1

## 2021-12-14 MED ORDER — IBUPROFEN 600 MG PO TABS: 600.0000 mg | ORAL_TABLET | Freq: Four times a day (QID) | ORAL | 0 refills | Status: DC

## 2021-12-14 MED ORDER — GABAPENTIN 300 MG PO CAPS: 300.0000 mg | ORAL_CAPSULE | Freq: Two times a day (BID) | ORAL | 0 refills | Status: DC

## 2021-12-14 MED ORDER — SIMETHICONE 80 MG PO CHEW: 80.0000 mg | CHEWABLE_TABLET | ORAL | 0 refills | Status: DC | PRN

## 2021-12-14 MED ORDER — PRENATAL MULTIVITAMIN CH: 1.0000 | ORAL_TABLET | Freq: Every day | ORAL | 4 refills | Status: DC

## 2021-12-14 MED ORDER — DOCUSATE SODIUM 100 MG PO CAPS: 100.0000 mg | ORAL_CAPSULE | Freq: Two times a day (BID) | ORAL | 0 refills | Status: AC

## 2021-12-14 NOTE — Progress Notes (Signed)
Mother discharged.  Discharge instructions given.  Mother verbalizes understanding.  Transported by nursing staff.  

## 2021-12-14 NOTE — Discharge Summary (Addendum)
Obstetrical Discharge Summary  Date of Admission: 12/10/2021 Date of Discharge: 12/14/2021  Primary OB: [facility]  Gestational Age at Delivery: [redacted]w[redacted]d  Antepartum complications: gestational diabetes, intrauterine growth restriction, and CHTN, obesity, homelessness  Reason for Admission: IOL secondary to IUGR Date of Delivery: 12/11/2021  Delivered By: Dr ARubie Maid Delivery Type: primary cesarean section, low transverse incision Intrapartum complications/course: Failure to Progress, Gestational Diabetes, diet controlled, IUGR, and Non-reassuring Fetal Status Anesthesia: epidural Placenta: Delivered and expressed via active management. Intact: yes. To pathology: yes.  Laceration: NA Episiotomy: LCT uterus EBL: [id number]: Liveborn female, APGARs 8/9, weight 1990 g.    Discharge Diagnosis: Delivered. Postop Day 3  Postpartum course: Doing well.  Ambulating and voiding without difficulty.  Has some discomfort when first getting out of bed and in lower back, otherwise pain well managed with PRN medication and heating pad, using oxy occasionally. Passing flatus, has had a BM, but it was difficult to pass.  Has a good appetite. Trying to breastfeed, infant in NICU has latched for a few seconds at a time.  Is pumping when she can. Mood is overall ok, admits to being sad as she needs to leave her baby. Partner present and helpful.  She will have support from a friend once home. [place] 6 [place]  Discharge Vital Signs:  Current Vital Signs 24h Vital Sign Ranges  T 98.1 F (36.7 C) Temp  Avg: 98.2 F (36.8 C)  Min: 97.9 F (36.6 C)  Max: 98.8 F (37.1 C)  BP 135/85 BP  Min: 111/76  Max: 150/83  HR 93 Pulse  Avg: 102.8  Min: 86  Max: 130  RR 18 Resp  Avg: 18.9  Min: 18  Max: 20  SaO2 97 % Room Air SpO2  Avg: 97.9 %  Min: 96 %  Max: 100 %       24 Hour I/O Current Shift I/O  Time Ins Outs No intake/output data recorded. No  intake/output data recorded.    Patient Vitals for the past 6 hrs:  BP Temp Temp src Pulse Resp SpO2  12/14/21 0730 135/85 98.1 F (36.7 C) Oral 93 18 97 %    Discharge Exam:  NAD Breasts: soft, no redness or masses, nipples erect and intact bilaterally.  Perineum: not assessed  Abdomen: firm fundus below the umbilicus, NTTP, non distended, +bowel sounds.  Incision c/d/i with honeycomb dressing  RRR no MRGs CTAB Ext: +1-2 BLE, negative homan's   Recent Labs  Lab 12/10/21 0820 12/12/21 0646  WBC 18.4* 17.3*  HGB 13.1 10.6*  HCT 40.2 32.2*  PLT 368 270    Disposition:  discharge to hotel   Rh Immune globulin given: no Rubella vaccine given: no Tdap vaccine given in AP or PP setting: yes Flu vaccine given in AP or PP setting: yes  Contraception:  may consider pills in the future   Prenatal/Postnatal Panel: A POS//Rubella Immune//Varicella Not immune//RPR negative//HIV negative/HepB Surface Ag negative//pap no abnormalities (date: 07/16/2021)//plans to breastfeed  Plan:  ElCarlisle Cateras discharged to home in good condition. Follow-up appointment with Dr ChMarcelline Matesin 1 week for a postop visit  Future Appointments  Date Time Provider [place]  4:15 PM ChRubie MaidMD [facility] None  01/28/2022 10:45 [facility]    Discharge Medications: Allergies as of 12/14/2021       Reactions   Bee Venom Anaphylaxis  Medication List     STOP taking these medications    aspirin EC 81 MG tablet   BLOOD GLUCOSE TEST STRIPS Strp   nitrofurantoin (macrocrystal-monohydrate) 100 MG capsule Commonly known as: MACROBID   Prenatal Gummies/DHA & FA 0.4-32.5 MG Chew       TAKE these medications    Blood Pressure Kit Devi 1 kit by Does not apply route daily.   Butalbital-APAP-Caffeine 50-325-40 MG capsule Take 1-2 capsules by mouth every 6 (six) hours as needed for headache.   coconut oil Oil Apply 1 application.  topically as needed.   docusate sodium 100 MG capsule Commonly known as: Colace Take 1 capsule (100 mg total) by mouth 2 (two) times daily for 14 days.   gabapentin 300 MG capsule Commonly known as: NEURONTIN Take 1 capsule (300 mg total) by mouth 2 (two) times daily for 7 days.   ibuprofen 600 MG tablet Commonly known as: ADVIL Take 1 tablet (600 mg total) by mouth every 6 (six) hours.   NIFEdipine 30 MG 24 hr tablet Commonly known as: PROCARDIA-XL/NIFEDICAL-XL Take 1 tablet (30 mg total) by mouth daily. Can increase to twice a day as needed for elevated blood pressures   oxyCODONE 5 MG immediate release tablet Commonly known as: Oxy IR/ROXICODONE Take 1 tablet (5 mg total) by mouth every 6 (six) hours as needed for up to 3 days for severe pain.   prenatal multivitamin Tabs tablet Take 1 tablet by mouth daily at 12 noon.   simethicone 80 MG chewable tablet Commonly known as: MYLICON Chew 1 tablet (80 mg total) by mouth as needed for flatulence.               Discharge Care Instructions  (From admission, onward)           Start     Ordered   12/14/21 0000  Discharge wound care:       Comments: SHOWER DAILY Wash incision gently with soap and water.  Call office with any drainage, redness, or firmness of the incision.   12/14/21 [place], CNM  [facility], [facility]  @TODAY @  10:22 AM

## 2021-12-16 ENCOUNTER — Ambulatory Visit: Payer: Self-pay

## 2021-12-16 LAB — SURGICAL PATHOLOGY

## 2021-12-16 NOTE — Lactation Note (Signed)
This note was copied from a baby's chart. ?Lactation Consultation Note ? ?Patient Name: Lori Henry ?Today's Date: 12/16/2021 ?Reason for consult: Follow-up assessment;Primapara;NICU baby;Early term 37-38.6wks ?Age:26 days ? ?Maternal Data ?Has patient been taught Hand Expression?: Yes ?Does the patient have breastfeeding experience prior to this delivery?: No ? ?Mom expressed desire to feeding team today that she would like to breastfeed with Lori Henry. While in the hospital mom was not consistent with pumping, but has been utilizing a hand pump while separated. ? ?Feeding ?Nipple Type: Dr. Irving Burton Preemie ? ?Attempted feeding at the breast while NG feeding was started. ? ?LATCH Score ?Latch: Too sleepy or reluctant, no latch achieved, no sucking elicited. (baby was awake, mature milk expressed to encourage latch) ? ?Audible Swallowing: None (no latch, no swallows) ? ?Type of Nipple: Everted at rest and after stimulation ? ?Comfort (Breast/Nipple): Soft / non-tender ? ?Hold (Positioning): Assistance needed to correctly position infant at breast and maintain latch. ? ?LATCH Score: 5 ? ?Baby placed in football hold at R breast; mom reports this breast has fastest flow. Lori Henry was irritable when being pulled in closely, did not show interest in latching even with use of hand expression of mature milk on baby's mouth. Multiple readjustments of position, multiple attempts made to latch were not successful.  ? ?Lactation Tools Discussed/Used ?Tools: Pump ?Breast pump type: Double-Electric Breast Pump (set-up at bedside in SCN) ?Pump Education: Milk Storage (reviewed milk storage guidelines as mom has not been bringing in any milk; thought it was only good for 4 hours total) ?Reason for Pumping: baby in SCN ?Pumping frequency: q 3 hours (15 minutes per side with manual pump while at home) ? ?Reviewed that mom can store EBM in fridge in hotel room for up to 5 days and encouraged her to then transport in ziploc bag  with a few ice cubes when she comes to visit. Mom expressed understanding. ? ?Interventions ?Interventions: Breast feeding basics reviewed;Assisted with latch;Hand express;Adjust position;Support pillows;Position options;DEBP;Hand pump;Education [facility] services; pump obtainment through [facility], importance of pumping frequency with DEBP for optimal stimulation/milk removal) ? ?We reviewed the steps of lick and learn and advancement to feedings at the breast may be slower based on baby's exposure and experience with a bottle. Tools such as a nipple shield with expressed breastmilk may be an option to encourage feeding. ? ?LC and RN at bedside discussed with mom the importance of obtaining a DEBP through [facility]. Re-educated mom on process of contacting them, reminded her that a referral was faxed before her discharge, and continue to call them. ? ?Discharge ?Pump:  (Referral was sent before mom was discharged home; Mom attempted 1 follow-up call with [facility] and has not tried since) ?WIC Program: Yes ? ?Consult Status ?Consult Status: PRN (as needed when mom is visiting in SCN) ? ? ? ?Lori Henry ?12/16/2021, 3:09 PM ? ? ? ?

## 2021-12-23 ENCOUNTER — Encounter: Payer: Self-pay | Admitting: Obstetrics and Gynecology

## 2021-12-23 ENCOUNTER — Ambulatory Visit (INDEPENDENT_AMBULATORY_CARE_PROVIDER_SITE_OTHER): Payer: Medicaid Other | Admitting: Obstetrics and Gynecology

## 2021-12-23 VITALS — BP 138/90 | HR 117 | Ht 65.0 in | Wt 267.5 lb

## 2021-12-23 DIAGNOSIS — Z4889 Encounter for other specified surgical aftercare: Secondary | ICD-10-CM

## 2021-12-23 DIAGNOSIS — Z8632 Personal history of gestational diabetes: Secondary | ICD-10-CM

## 2021-12-23 DIAGNOSIS — F418 Other specified anxiety disorders: Secondary | ICD-10-CM

## 2021-12-23 DIAGNOSIS — I10 Essential (primary) hypertension: Secondary | ICD-10-CM

## 2021-12-23 DIAGNOSIS — O99345 Other mental disorders complicating the puerperium: Secondary | ICD-10-CM

## 2021-12-23 DIAGNOSIS — Z98891 History of uterine scar from previous surgery: Secondary | ICD-10-CM

## 2021-12-23 NOTE — Progress Notes (Unsigned)
GYNECOLOGY PROGRESS NOTE  Subjective:    Patient ID: Lori Henry, female    DOB: [birth year], 26 y.o.   MRN: [id number]  HPI  Patient is a 26 y.o. G42P1001 female who presents for cesarean incision check. H/o C-section for failure to progress (failed induction). Pregnancy complicated by cHN, DM (diet controlled), and severe IUGR. Delivery occurred at [redacted] weeks gestation. No issues or concerns at this time. Pain is controlled, taking Motrin.  Infant came home from NICU yesterday so patient is excited.  Accompanied by her mother today.  Patient is still currently residing in a hotel with FOB.   Desires to be screened for anxiety today. Notes she is feeling very anxious.   The following portions of the patient's history were reviewed and updated as appropriate: allergies, current medications, past family history, past medical history, past social history, past surgical history, and problem list.  Review of Systems Pertinent items are noted in HPI.   Objective:   Blood pressure 138/90, pulse (!) 117, height 5\' 5"  (1.651 m), weight 267 lb 8 oz (121.3 kg), unknown if currently breastfeeding. Body mass index is 44.51 kg/m. General appearance: alert, cooperative, and no distress Abdomen: soft, non-tender; bowel sounds normal; no masses,  no organomegaly.  Incision clean, dry, well-approximated, no erythema. Covered with Dermabond.  Pelvic: deferred.       12/23/2021    5:05 PM 12/12/2021   11:42 PM  Edinburgh Postnatal Depression Scale Screening Tool  I have been able to laugh and see the funny side of things. 0 0  I have looked forward with enjoyment to things. 0 0  I have blamed myself unnecessarily when things went wrong. 1 1  I have been anxious or worried for no good reason. 3 0  I have felt scared or panicky for no good reason. 2 0  Things have been getting on top of me. 1 0  I have been so unhappy that I have had difficulty sleeping. 0 0  I have felt sad or miserable. 0 0  I  have been so unhappy that I have been crying. 0 0  The thought of harming myself has occurred to me. 0 0  Edinburgh Postnatal Depression Scale Total 7 1       12/24/2021    7:50 AM  GAD 7 : Generalized Anxiety Score  Nervous, Anxious, on Edge 3  Control/stop worrying 3  Worry too much - different things 3  Trouble relaxing 3  Restless 3  Easily annoyed or irritable 0  Afraid - awful might happen 0  Total GAD 7 Score 15  Anxiety Difficulty Not difficult at all      Assessment:   1. Postoperative visit   2. S/P cesarean section   3. Essential hypertension   4. History of gestational diabetes     Plan:   Continue any current medications as needed.  Discussed wound care.  Patient with history of hypertension in pregnancy.  Continue to take Procardia until 6 weeks postpartum. Patient gestational diabetes, patient will need to be rescreened for diabetes after postpartum visit. Postpartum anxiety noted on today's screen.  Discussed management options including referral to counseling, medications, or both.  Patient will think about medications.  Also seen by SW 12/26/2021 today in clinic.  RTC in 5 weeks for postpartum visit. Also desires circumcision for female infant, not performed prior to discharge.  Can schedule in 2 weeks.   Rockne Menghini, MD [facility]

## 2021-12-24 ENCOUNTER — Encounter: Payer: Self-pay | Admitting: Obstetrics and Gynecology

## 2021-12-24 MED ORDER — SERTRALINE HCL 50 MG PO TABS: 50.0000 mg | ORAL_TABLET | Freq: Every day | ORAL | 1 refills | Status: DC

## 2021-12-25 NOTE — Progress Notes (Deleted)
Error encounter  This encounter was created in error - please disregard. This encounter was created in error - please disregard.

## 2022-01-01 ENCOUNTER — Encounter: Payer: Self-pay | Admitting: Obstetrics and Gynecology

## 2022-01-06 ENCOUNTER — Encounter: Payer: Self-pay | Admitting: Obstetrics and Gynecology

## 2022-01-09 ENCOUNTER — Encounter: Payer: Medicaid Other | Admitting: Obstetrics and Gynecology

## 2022-01-26 NOTE — Progress Notes (Signed)
OBSTETRICS POSTPARTUM CLINIC PROGRESS NOTE  Subjective:     Lori Henry is a 26 y.o. G72P1001 female who presents for a postpartum visit. She is 6 weeks postpartum following a low cervical transverse Cesarean section. I have fully reviewed the prenatal and intrapartum course. The delivery was at 37.1 gestational weeks for IUGR, diet controlled DM and pre-existing HTN.  Anesthesia: epidural. Postpartum course has been complicated, she has been having anxiety and crying on and off. Baby's course has been complicated at times, she thinks he has colic, he has days that he cries non stop. She said even when he is dry and feed, he still cries and she can't soothe him. Baby is feeding by bottle - Similac Neosure. Bleeding: patient has not resumed menses, with No LMP recorded. Bowel function is normal. Bladder function is normal. Patient is sexually active (unprotected intercourse twice). Contraception method desired is  Unsure . Postpartum depression screening: positive.  EDPS score is 11.   Socially, patient reports that she and her baby are no longer living in a hotel and has now moved in with a friend.  Notes that FOB has not been supportive, currently is unemployed and is not looking for a job at this time. He had been dealing with homelessness issued through out her pregnancy. She does not think that she will be with him much longer. Feels that is also more easily irritated and angered when the baby cries and shouts at her baby.   The following portions of the patient's history were reviewed and updated as appropriate: allergies, current medications, past family history, past medical history, past social history, past surgical history, and problem list.  Review of Systems Pertinent items noted in HPI and remainder of comprehensive ROS otherwise negative.   Objective:    BP 130/88   Pulse 89   Resp 16   Ht 5\' 5"  (1.651 m)   Wt 268 lb (121.6 kg)   LMP  (LMP Unknown)   Breastfeeding No    BMI 44.60 kg/m   General:  alert and no distress   Breasts:  inspection negative, no nipple discharge or bleeding, no masses or nodularity palpable  Lungs: clear to auscultation bilaterally  Heart:  regular rate and rhythm, S1, S2 normal, no murmur, click, rub or gallop  Abdomen: soft, non-tender; bowel sounds normal; no masses,  no organomegaly.  Well healed Pfannenstiel incision   Vulva:  normal  Vagina: normal vagina, no discharge, exudate, lesion, or erythema  Adnexa:  normal adnexa and no mass, fullness, tenderness  Rectal Exam: Not performed.         Labs:  Lab Results  Component Value Date   HGB 10.6 (L) 12/12/2021     Assessment:   1. Postpartum care following cesarean delivery   2. Essential hypertension   3. History of gestational diabetes   4. Postpartum anxiety   5. Morbid obesity with BMI of 45.0-49.9, adult (HCC)   6. Encounter for screening for maternal depression      Plan:   1. Contraception:  lengthy discussion had with patient on all options, patient initially hesitant as she felt like contraception affected her fertility (has used Depo Provera and Mirena in the past), but after discussion of other options, as well as her h/o PCOS, patient ok to try Nexplanon.  Will insert today (see procedure note below).  2. Essential HTN, currrently taking medication as prescribed.  3. History of GDM (diet). Patient to be rescreened at next visit.  4. Postpartum anxiety and screen for maternal depression positive today, patient notes that she feels it is mostly stress related due to her FOB not being supportive. Declines further management at this time (counseling, medications).  5. Follow up in:  3-4  months or as needed.        GYNECOLOGY OFFICE PROCEDURE NOTE  ZIPPORAH FINAMORE is a 26 y.o. G1P1001 here for Nexplanon insertion.  Last pap smear was on 07/16/2021 and was normal.  Nexplanon Insertion Procedure Patient identified, informed consent performed,  consent signed.   Patient does understand that irregular bleeding is a very common side effect of this medication. She was advised to have backup contraception for one week after placement. Pregnancy test in clinic today was negative.  Appropriate time out taken.  Patient's left arm was prepped and draped in the usual sterile fashion. The ruler used to measure and mark insertion area.  Patient was prepped with alcohol swab and then injected with 3 ml of 1% lidocaine.  She was prepped with betadine, Nexplanon removed from packaging,  Device confirmed in needle, then inserted full length of needle and withdrawn per handbook instructions. Nexplanon was able to palpated in the patient's arm; patient palpated the insert herself. There was minimal blood loss.  Patient insertion site covered with guaze and a pressure bandage to reduce any bruising.  The patient tolerated the procedure well and was given post procedure instructions.    Hildred Laser, MD [facility]

## 2022-01-28 ENCOUNTER — Encounter: Payer: Self-pay | Admitting: Obstetrics and Gynecology

## 2022-01-28 ENCOUNTER — Ambulatory Visit (INDEPENDENT_AMBULATORY_CARE_PROVIDER_SITE_OTHER): Payer: Medicaid Other | Admitting: Obstetrics and Gynecology

## 2022-01-28 DIAGNOSIS — O1003 Pre-existing essential hypertension complicating the puerperium: Secondary | ICD-10-CM

## 2022-01-28 DIAGNOSIS — F418 Other specified anxiety disorders: Secondary | ICD-10-CM

## 2022-01-28 DIAGNOSIS — Z8632 Personal history of gestational diabetes: Secondary | ICD-10-CM

## 2022-01-28 DIAGNOSIS — Z1332 Encounter for screening for maternal depression: Secondary | ICD-10-CM | POA: Diagnosis not present

## 2022-01-28 DIAGNOSIS — Z6841 Body Mass Index (BMI) 40.0 and over, adult: Secondary | ICD-10-CM

## 2022-01-28 DIAGNOSIS — O99345 Other mental disorders complicating the puerperium: Secondary | ICD-10-CM

## 2022-01-28 DIAGNOSIS — I10 Essential (primary) hypertension: Secondary | ICD-10-CM

## 2022-01-28 DIAGNOSIS — Z30017 Encounter for initial prescription of implantable subdermal contraceptive: Secondary | ICD-10-CM

## 2022-01-28 DIAGNOSIS — O99215 Obesity complicating the puerperium: Secondary | ICD-10-CM

## 2022-01-28 NOTE — Patient Instructions (Signed)
NEXPLANON PLACEMENT POST-PROCEDURE INSTRUCTIONS  You may take Ibuprofen, Aleve or Tylenol for pain if needed.  Pain should resolve within in 24 hours.  You may have intercourse after 24 hours.  If you using this for birth control, it is effective immediately.  You need to call if you have any fever, heavy bleeding, or redness at insertion site. Irregular bleeding is common the first several months after having a Nexplanonplaced. You do not need to call for this reason unless you are concerned.  Shower or bathe as normal.  You can remove the bandage after 24 hours.  

## 2022-02-09 MED ORDER — ETONOGESTREL 68 MG ~~LOC~~ IMPL
68.0000 mg | DRUG_IMPLANT | Freq: Once | SUBCUTANEOUS | Status: AC
  Administered 2022-01-28: 68 mg via SUBCUTANEOUS

## 2022-02-09 NOTE — Addendum Note (Signed)
Addended by: Tommie Raymond on: 02/09/2022 03:19 PM   Modules accepted: Orders

## 2022-03-02 ENCOUNTER — Ambulatory Visit: Payer: Medicaid Other

## 2022-03-31 ENCOUNTER — Telehealth: Payer: Self-pay | Admitting: Obstetrics and Gynecology

## 2022-03-31 ENCOUNTER — Encounter: Payer: Self-pay | Admitting: Obstetrics and Gynecology

## 2022-03-31 NOTE — Telephone Encounter (Signed)
Attempted to call patient regarding her concerns.  Left voicemail.  If skin is completely covering the glans of the penis, it is likely due to not fully retracting after circumcision to keep area clean. I'm not sure if she has let the Pediatricians know about this either if this has been on for a while. She should bring the baby in to be seen by the Pediatrician as it has been several months since the circumcision.

## 2022-03-31 NOTE — Telephone Encounter (Signed)
Patient called and states that her son was circumcised here on 01/09/22. Patient states her son is now 61 months old and does not look circumcised anymore. Pt says the skin completely covers the head of his penis and the head of the penis is purple in color. Pt states the head of the penis has been purple since the circumcision. States it does not look infected. Pt states she noticed the skin around the head starting to happen around 3 weeks ago. Please advise.

## 2022-05-07 ENCOUNTER — Encounter: Payer: Self-pay | Admitting: Obstetrics and Gynecology

## 2022-09-07 ENCOUNTER — Ambulatory Visit: Payer: Medicaid Other

## 2022-09-25 ENCOUNTER — Ambulatory Visit (INDEPENDENT_AMBULATORY_CARE_PROVIDER_SITE_OTHER): Payer: Medicaid Other

## 2022-09-25 ENCOUNTER — Encounter: Payer: Self-pay | Admitting: Emergency Medicine

## 2022-09-25 ENCOUNTER — Ambulatory Visit
Admission: EM | Admit: 2022-09-25 | Discharge: 2022-09-25 | Disposition: A | Discharge status: Home / Self Care | Payer: Medicaid Other | Attending: Family Medicine | Admitting: Family Medicine

## 2022-09-25 DIAGNOSIS — K219 Gastro-esophageal reflux disease without esophagitis: Secondary | ICD-10-CM | POA: Diagnosis present

## 2022-09-25 DIAGNOSIS — R0789 Other chest pain: Secondary | ICD-10-CM | POA: Insufficient documentation

## 2022-09-25 DIAGNOSIS — R079 Chest pain, unspecified: Secondary | ICD-10-CM | POA: Diagnosis not present

## 2022-09-25 DIAGNOSIS — R0602 Shortness of breath: Secondary | ICD-10-CM

## 2022-09-25 LAB — CBC WITH DIFFERENTIAL/PLATELET
Abs Immature Granulocytes: 0.06 10*3/uL (ref 0.00–0.07)
Basophils Absolute: 0.1 10*3/uL (ref 0.0–0.1)
Basophils Relative: 1 %
Eosinophils Absolute: 0.1 10*3/uL (ref 0.0–0.5)
Eosinophils Relative: 1 %
HCT: 39.3 % (ref 36.0–46.0)
Hemoglobin: 12.5 g/dL (ref 12.0–15.0)
Immature Granulocytes: 1 %
Lymphocytes Relative: 24 %
Lymphs Abs: 2.6 10*3/uL (ref 0.7–4.0)
MCH: 25.2 pg — ABNORMAL LOW (ref 26.0–34.0)
MCHC: 31.8 g/dL (ref 30.0–36.0)
MCV: 79.1 fL — ABNORMAL LOW (ref 80.0–100.0)
Monocytes Absolute: 0.7 10*3/uL (ref 0.1–1.0)
Monocytes Relative: 6 %
Neutro Abs: 7.4 10*3/uL (ref 1.7–7.7)
Neutrophils Relative %: 67 %
Platelets: 352 10*3/uL (ref 150–400)
RBC: 4.97 MIL/uL (ref 3.87–5.11)
RDW: 14.7 % (ref 11.5–15.5)
WBC: 10.9 10*3/uL — ABNORMAL HIGH (ref 4.0–10.5)
nRBC: 0 % (ref 0.0–0.2)

## 2022-09-25 LAB — BASIC METABOLIC PANEL
Anion gap: 7 (ref 5–15)
BUN: 12 mg/dL (ref 6–20)
CO2: 25 mmol/L (ref 22–32)
Calcium: 9.1 mg/dL (ref 8.9–10.3)
Chloride: 105 mmol/L (ref 98–111)
Creatinine, Ser: 0.6 mg/dL (ref 0.44–1.00)
GFR, Estimated: >60 mL/min (ref >60)
Glucose, Bld: 93 mg/dL (ref 70–99)
Potassium: 3.7 mmol/L (ref 3.5–5.1)
Sodium: 137 mmol/L (ref 135–145)

## 2022-09-25 LAB — TROPONIN I (HIGH SENSITIVITY): Troponin I (High Sensitivity): 2 ng/L (ref <18)

## 2022-09-25 MED ORDER — OMEPRAZOLE 40 MG PO CPDR: 40.0000 mg | DELAYED_RELEASE_CAPSULE | Freq: Every day | ORAL | 2 refills | Status: DC

## 2022-09-25 MED ORDER — ALUM & MAG HYDROXIDE-SIMETH 200-200-20 MG/5ML PO SUSP
30.0000 mL | Freq: Once | ORAL | Status: AC
  Administered 2022-09-25: 30 mL via ORAL

## 2022-09-25 MED ORDER — LIDOCAINE VISCOUS HCL 2 % MT SOLN
15.0000 mL | Freq: Once | OROMUCOSAL | Status: AC
  Administered 2022-09-25: 15 mL via OROMUCOSAL

## 2022-09-25 MED ORDER — AMLODIPINE BESYLATE 10 MG PO TABS: 10.0000 mg | ORAL_TABLET | Freq: Every day | ORAL | 2 refills | Status: DC

## 2022-09-25 NOTE — ED Provider Notes (Signed)
[facility]    CSN: [id number] Arrival date & time: 09/25/22  1910      History   Chief Complaint Chief Complaint  Patient presents with   Chest Pain    HPI Lori Henry is a 27 y.o. female.   HPI  Lori Henry here for burining "hot scratch" right and central chest pain/discomfort. Reports having chest pain for the past 4 days . She trying to go to sleep when the pain started. That feels like a "hot scractch" and radiated from central chest to her ruight side. No similar sx previousl.  Has not been lifting any heavy objects. She works as a Animal nutritionist.  Took 1500 mg Tylenol twice a day, Bayer aspirin which helps but the pain comes back.   She is experiencing shortness of breath when she seating at work. No neck pain, jaw pain, sweating, vomiting or shoulder pain.   Has nausea during these times. Has inceased pain while laying flat. Has history of GERD. Mom gave her some Pepto which helped.  Has chronic back pain but no changes.    Chest pain began ***  Pain is: *** What worsens the pain: *** What makes the pain better: *** Radiation: *** Patient believes might be causing their pain: *** Improvement with Nitroglycerine: *** Pt does *** take ASA daily.  Treatments tried ***  Follows with *** cardiology.    {Associated Symptoms:25148}  {Patient Denies:25149}  Pt reports *** history of PE, DVT, DM, HTN, cancer, recent surgery ***  Tobacco use ***    *** Fever : no  Chills: no Sore throat: no   Cough: no Sputum: no Nasal congestion : no  Appetite: normal  Hydration: normal  Abdominal pain: no Nausea: no Vomiting: no Dysuria: no  Sleep disturbance: no Back Pain: no Headache: no   Past Medical History:  Diagnosis Date   Anxiety    Depression    Gestational diabetes    Hypertension    Morbid obesity ([place])    Seizures ([place])    Febrile seizures as a child and undiagnosed seizure on 01/2015    Patient Active Problem List    Diagnosis Date Noted   Pre-existing essential hypertension during pregnancy, antepartum    Morbid obesity with BMI of 45.0-49.9, adult ([place])    IUGR (intrauterine growth restriction) affecting care of mother 12/10/2021   Pregnancy affected by fetal growth restriction 12/09/2021   Decreased fetal movement 11/20/2021   Indication for care in labor or delivery 10/29/2021   Maternal varicella, non-immune 10/10/2021   Gestational diabetes mellitus 10/10/2021   Fall 10/04/2021   History of kidney stones 09/27/2021   History of recurrent UTIs 09/27/2021   Vaginal spotting    Indication for care in labor and delivery, antepartum 09/26/2021   Chronic hypertension with exacerbation during pregnancy in second trimester 09/26/2021   Abdominal pain during pregnancy in second trimester    Pregnancy 09/04/2021   Essential hypertension 08/15/2019   PTSD (post-traumatic stress disorder) 08/15/2019   MDD (major depressive disorder) 08/14/2019   Panic attack     Past Surgical History:  Procedure Laterality Date   CESAREAN SECTION  12/11/2021   Procedure: CESAREAN SECTION;  Surgeon: Rubie Maid, MD;  Location: [facility] ORS;  Service: Obstetrics;;   CHOLECYSTECTOMY N/A 10/23/2016   Procedure: LAPAROSCOPIC CHOLECYSTECTOMY WITH INTRAOPERATIVE CHOLANGIOGRAM;  Surgeon: Leonie Green, MD;  Location: [facility] ORS;  Service: General;  Laterality: N/A;   MOUTH BIOPSY     benign   MOUTH SURGERY  WISDOM TOOTH EXTRACTION      OB History     Gravida  1   Para  1   Term  1   Preterm      AB      Living  1      SAB      IAB      Ectopic      Multiple  0   Live Births  1            Home Medications    Prior to Admission medications   Medication Sig Start Date End Date Taking? Authorizing Provider  Prenatal Vit-Fe Fumarate-FA (PRENATAL MULTIVITAMIN) TABS tablet Take 1 tablet by mouth daily at 12 noon. 12/14/21   Dominic, Nunzio Cobbs, CNM  sertraline (ZOLOFT) 50 MG tablet Take 1  tablet (50 mg total) by mouth daily. Start with 1/2 tablet daily x 1 week, then increase to 1 tablet daily. 12/24/21   Rubie Maid, MD    Family History Family History  Problem Relation Age of Onset   Hyperlipidemia Mother    Diabetes Mother    Hypertension Mother    Healthy Father     Social History Social History   Tobacco Use   Smoking status: Former    Packs/day: 0.25    Years: 5.00    Total pack years: 1.25    Types: Cigarettes    Quit date: 04/05/2021    Years since quitting: 1.4   Smokeless tobacco: Former    Types: Nurse, children's Use: Some days  Substance Use Topics   Alcohol use: No   Drug use: No     Allergies   Bee venom   Review of Systems Review of Systems :negative unless otherwise stated in HPI.      Physical Exam Triage Vital Signs ED Triage Vitals  Enc Vitals Group     BP 09/25/22 1927 (!) 151/103     Pulse Rate 09/25/22 1927 84     Resp 09/25/22 1927 15     Temp 09/25/22 1927 98.5 F (36.9 C)     Temp Source 09/25/22 1927 Oral     SpO2 09/25/22 1927 100 %     Weight 09/25/22 1924 268 lb (121.6 kg)     Height 09/25/22 1924 '5\' 5"'$  (1.651 m)     Head Circumference --      Peak Flow --      Pain Score 09/25/22 1923 8     Pain Loc --      Pain Edu? --      Excl. in [place]? --    No data found.  Updated Vital Signs BP (!) 151/103 (BP Location: Left Arm)   Pulse 84   Temp 98.5 F (36.9 C) (Oral)   Resp 15   Ht '5\' 5"'$  (1.651 m)   Wt 121.6 kg   SpO2 100%   Breastfeeding No   BMI 44.60 kg/m   Visual Acuity Right Eye Distance:   Left Eye Distance:   Bilateral Distance:    Right Eye Near:   Left Eye Near:    Bilateral Near:     Physical Exam  GEN: pleasant well appearing female, in no acute distress *** CV: regular rate and rhythm,  no murmurs, rubs or gallops  appreciated,, no JVP *** CHEST WALL: *** RESP: no increased work of breathing, clear to ascultation bilaterally ABD: Bowel sounds present. Soft, non-tender,  non-distended. *** MSK: no edema, or calf  tenderness SKIN: warm, dry, no rash on visible skin NEURO: alert, moves all extremities appropriately PSYCH: Normal affect, appropriate speech and behavior   UC Treatments / Results  Labs (all labs ordered are listed, but only abnormal results are displayed) Labs Reviewed  CBC WITH DIFFERENTIAL/PLATELET - Abnormal; Notable for the following components:      Result Value   WBC 10.9 (*)    MCV 79.1 (*)    MCH 25.2 (*)    All other components within normal limits  BASIC METABOLIC PANEL  TROPONIN I (HIGH SENSITIVITY)    EKG   Radiology No results found.  Procedures Procedures (including critical care time)  Medications Ordered in UC Medications - No data to display  Initial Impression / Assessment and Plan / UC Course  I have reviewed the triage vital signs and the nursing notes.  Pertinent labs & imaging results that were available during my care of the patient were reviewed by me and considered in my medical decision making (see chart for details).       Patient is a 27 y.o. female with history of ***  who presents with *** day of ***sided chest pain. Keeana has ***no history of PE.  Able to [facility] pt out therefore doubt PE.  EKG obtained and is without ST elevations or concern for ischemia, ***NSR.  CBC unremarkable for anemia.  There were no electrolyte abnormalitis seen on CMP.  Chest x-ray did not show bacterial pneumonia, pleural effusions or pneumothorax. Initial hsTrop was negative.  I do not think his chest pain is cardiopulmonary related. No GERD symptoms. Not likely illicit drug induced. No other anxiety signs or symptoms. Exam not concerning for costochondritis however pain is reproducible, on exam.  This does not appear to be MSK related.     She does *** use marijuana and this could be drug-related.  -***-Initial cardiac HS troponin negative. HEART pathway score is ***, indicating that the patient has an elevated risk  score and requires further evaluation.  It may anxiety. Pt was given*** Atarax 25 mg 3 times daily as needed and was advised to follow with his primary care provider, if this medication helps their chest discomfort.  Discussed MDM, treatment plan and plan for follow-up with patient/parent who agrees with plan.    *** PE: History of PE ***. Unable to [facility] her out.  Wells score ***.  Obtained STAT D-dimer.  Satting well on room air. Advised to go to the ED for CTA Chest, if elevated.  Anxiety: *** ACS: EKG obtained without ST elevations or concern for ischemia at this time. MSK/Costochondritis: pain ***not reproducible on exam.  Anemia: ***History of fibroids/abnormal uterine bleeding.  Obtained CBC Electrolyte abnormalities: obtain CMP Thyroid dysregulation: Consider TSH with reflex T4.       Final Clinical Impressions(s) / UC Diagnoses   Final diagnoses:  None   Discharge Instructions   None    ED Prescriptions   None    PDMP not reviewed this encounter.

## 2022-09-25 NOTE — Discharge Instructions (Addendum)
For you chest pain: I suspect your have acid reflux. You were given medication here that helped. I sent omeprazole to your pharmacy. Take once a day 30 minutes prior to a meal.  For your blood pressure: start amlodipine 10 mg. Take your blood pressures daily for the next 2 weeks.   It is important that you establish care with a primary care provider so that you can get regular refills of your medication and manage your anxiety.     Try to increase your exercise and work on your healthy eating habits.

## 2022-09-25 NOTE — ED Triage Notes (Signed)
Patient c/o chest pain off and on for 4 days.  Patient reports some SOB.  Patient denies cold symptoms.  Patient reports runny nose that started today.

## 2022-09-28 ENCOUNTER — Ambulatory Visit (LOCAL_COMMUNITY_HEALTH_CENTER): Payer: Medicaid Other | Admitting: Family Medicine

## 2022-09-28 ENCOUNTER — Ambulatory Visit: Payer: Medicaid Other | Admitting: Family Medicine

## 2022-09-28 ENCOUNTER — Encounter: Payer: Self-pay | Admitting: Family Medicine

## 2022-09-28 VITALS — BP 140/97 | HR 90 | Ht 65.0 in | Wt 278.0 lb

## 2022-09-28 DIAGNOSIS — Z3009 Encounter for other general counseling and advice on contraception: Secondary | ICD-10-CM | POA: Diagnosis not present

## 2022-09-28 DIAGNOSIS — Z3046 Encounter for surveillance of implantable subdermal contraceptive: Secondary | ICD-10-CM

## 2022-09-28 DIAGNOSIS — Z8632 Personal history of gestational diabetes: Secondary | ICD-10-CM | POA: Insufficient documentation

## 2022-09-28 DIAGNOSIS — Z0000 Encounter for general adult medical examination without abnormal findings: Secondary | ICD-10-CM

## 2022-09-28 MED ORDER — NORETHINDRONE 0.35 MG PO TABS: 1.0000 | ORAL_TABLET | Freq: Every day | ORAL | 12 refills | Status: AC

## 2022-09-28 NOTE — Progress Notes (Signed)
Pt here for physical and Nexplanon removal.  No in house labs performed.  Pt declines STI screening.  Nexplanon removed by Art Buff, FNP without complications.  Family planning packet provided.  Pt advised Rx for OCPs would be called to pharmacy for pickup.  Stressed importance of condoms as back up method x 7 days.  Pt verbalizes understanding.-Dannette Kinkaid, RN

## 2022-09-28 NOTE — Progress Notes (Signed)
[facility] Number: [phone]  Family Planning Visit- Initial Visit  Subjective:  Lori Henry is a 27 y.o.  G1P1001   being seen today for an initial annual visit and to discuss reproductive life planning.  The patient is currently using Hormonal Implant for pregnancy prevention. Patient reports she/her/hers  does not want a pregnancy in the next year.    she/her/hers report they are looking for a method that provides Does not want something inserted  Patient has the following medical conditions has MDD (major depressive disorder); Panic attack; Essential hypertension; PTSD (post-traumatic stress disorder); History of kidney stones; History of recurrent UTIs; Morbid obesity with BMI of 45.0-49.9, adult ([place]); Hx of seizure disorder; PCOS (polycystic ovarian syndrome); and History of gestational diabetes mellitus (GDM) on their problem list.  Chief Complaint  Patient presents with   Contraception    Nexplanon removal patient complains of weight gain and headaches     Patient reports to clinic for Nexplanon removal- stating this has caused her to have 4x/daily headaches and weight gain.   Patient denies any other concerns or problems   Body mass index is 46.26 kg/m. - Patient is eligible for diabetes screening based on BMI> 25 and age Q000111Q?  not applicable Q000111Q ordered? not applicable  Patient reports 1  partner/s in last year. Desires STI screening?  No - declined  Has patient been screened once for HCV in the past?  No  No results found for: "HCVAB"  Does the patient have current drug use (including MJ), have a partner with drug use, and/or has been incarcerated since last result? No  If yes-- Screen for HCV through [facility]   Does the patient meet criteria for HBV testing? No  Criteria:  -Household, sexual or needle sharing contact with HBV -History of drug use -HIV positive -Those  with known Hep C   Health Maintenance Due  Topic Date Due   INFLUENZA VACCINE  03/03/2022   COVID-19 Vaccine (3 - 2023-24 season) 04/03/2022    Review of Systems  Constitutional:  Negative for weight loss.  Eyes:  Negative for blurred vision.  Respiratory:  Negative for cough and shortness of breath.   Cardiovascular:  Negative for claudication.  Gastrointestinal:  Negative for nausea.  Genitourinary:  Negative for dysuria and frequency.  Skin:  Negative for rash.  Neurological:  Positive for headaches.  Endo/Heme/Allergies:  Does not bruise/bleed easily.    The following portions of the patient's history were reviewed and updated as appropriate: allergies, current medications, past family history, past medical history, past social history, past surgical history and problem list. Problem list updated.   See flowsheet for other program required questions.  Objective:   Vitals:   09/28/22 1505  BP: (!) 140/97  Pulse: 90  Weight: 278 lb (126.1 kg)  Height: '5\' 5"'$  (1.651 m)    Physical Exam Constitutional:      Appearance: She is obese.  HENT:     Head: Normocephalic and atraumatic.  Pulmonary:     Effort: Pulmonary effort is normal.  Chest:     Comments: Declined breast exam Abdominal:     Palpations: Abdomen is soft.  Genitourinary:    Comments: Declined genital exam- no concerns today Musculoskeletal:        General: Normal range of motion.  Skin:    General: Skin is warm and dry.  Neurological:     General: No focal  deficit present.     Mental Status: She is alert.  Psychiatric:        Mood and Affect: Mood normal.        Behavior: Behavior normal.     Assessment and Plan:  Lori Henry is a 27 y.o. female presenting to the [facility] for an initial annual wellness/contraceptive visit  Contraception counseling: Reviewed options based on patient desire and reproductive life plan. Patient is interested in oral contraception.  This was provided to the patient today.   Risks, benefits, and typical effectiveness rates were reviewed.  Questions were answered.  Written information was also given to the patient to review.    The patient will follow up in  1 years for surveillance.  The patient was told to call with any further questions, or with any concerns about this method of contraception.  Emphasized use of condoms 100% of the time for STI prevention.  Need for ECP was assessed. Not indicated. Pt had nexplanon in place  1. Well woman exam (no gynecological exam) -declined CBE -no concerns about self today  2. Encounter for Nexplanon removal Nexplanon Removal Patient identified, informed consent performed, consent signed.   Appropriate time out taken. Nexplanon site identified.  Area prepped in usual sterile fashon. 3 ml of 1% lidocaine with Epinephrine was used to anesthetize the area at the distal end of the implant and along implant site. A small stab incision was made right beside the implant on the distal portion.  The Nexplanon rod was grasped using curved hemostats/manual and removed without difficulty.  There was minimal blood loss. There were no complications.  Steri-strips were applied over the small incision.  A pressure bandage was applied to reduce any bruising.  The patient tolerated the procedure well and was given post procedure instructions.    Nexplanon:   Counseled patient to take OTC analgesic starting as soon as lidocaine starts to wear off and take regularly for at least 48 hr to decrease discomfort.  Specifically to take with food or milk to decrease stomach upset and for IB 600 mg (3 tablets) every 6 hrs; IB 800 mg (4 tablets) every 8 hrs; or Aleve 2 tablets every 12 hrs.   3. Family planning -counseled to RTC if she does not like this BCM -pt states she had the mirena previously and "I pulled it out myself when I was a teenage"  - norethindrone (ORTHO MICRONOR) 0.35 MG tablet; Take 1 tablet  (0.35 mg total) by mouth daily.  Dispense: 28 tablet; Refill: 12  Return in about 1 year (around 09/29/2023), or if symptoms worsen or fail to improve.  No future appointments.  Sharlet Salina,

## 2023-02-11 ENCOUNTER — Emergency Department (HOSPITAL_COMMUNITY)
Admission: EM | Admit: 2023-02-11 | Discharge: 2023-02-11 | Disposition: A | Discharge status: Home / Self Care | Payer: Medicaid Other | Attending: Student | Admitting: Student

## 2023-02-11 ENCOUNTER — Other Ambulatory Visit: Payer: Self-pay

## 2023-02-11 ENCOUNTER — Encounter (HOSPITAL_COMMUNITY): Payer: Self-pay | Admitting: *Deleted

## 2023-02-11 DIAGNOSIS — I10 Essential (primary) hypertension: Secondary | ICD-10-CM | POA: Insufficient documentation

## 2023-02-11 DIAGNOSIS — M7989 Other specified soft tissue disorders: Secondary | ICD-10-CM

## 2023-02-11 DIAGNOSIS — E876 Hypokalemia: Secondary | ICD-10-CM | POA: Diagnosis not present

## 2023-02-11 LAB — URINALYSIS, ROUTINE W REFLEX MICROSCOPIC
Bilirubin Urine: NEGATIVE
Glucose, UA: NEGATIVE mg/dL
Ketones, ur: NEGATIVE mg/dL
Leukocytes,Ua: NEGATIVE
Nitrite: NEGATIVE
Protein, ur: NEGATIVE mg/dL
Specific Gravity, Urine: 1.017 (ref 1.005–1.030)
pH: 6 (ref 5.0–8.0)

## 2023-02-11 LAB — CBC WITH DIFFERENTIAL/PLATELET
Abs Immature Granulocytes: 0.03 10*3/uL (ref 0.00–0.07)
Basophils Absolute: 0 10*3/uL (ref 0.0–0.1)
Basophils Relative: 1 %
Eosinophils Absolute: 0.2 10*3/uL (ref 0.0–0.5)
Eosinophils Relative: 2 %
HCT: 39.5 % (ref 36.0–46.0)
Hemoglobin: 12.6 g/dL (ref 12.0–15.0)
Immature Granulocytes: 0 %
Lymphocytes Relative: 33 %
Lymphs Abs: 2.9 10*3/uL (ref 0.7–4.0)
MCH: 26.3 pg (ref 26.0–34.0)
MCHC: 31.9 g/dL (ref 30.0–36.0)
MCV: 82.3 fL (ref 80.0–100.0)
Monocytes Absolute: 0.4 10*3/uL (ref 0.1–1.0)
Monocytes Relative: 5 %
Neutro Abs: 5.3 10*3/uL (ref 1.7–7.7)
Neutrophils Relative %: 59 %
Platelets: 291 10*3/uL (ref 150–400)
RBC: 4.8 MIL/uL (ref 3.87–5.11)
RDW: 14.5 % (ref 11.5–15.5)
WBC: 8.9 10*3/uL (ref 4.0–10.5)
nRBC: 0 % (ref 0.0–0.2)

## 2023-02-11 LAB — BRAIN NATRIURETIC PEPTIDE: B Natriuretic Peptide: 12 pg/mL (ref 0.0–100.0)

## 2023-02-11 LAB — BASIC METABOLIC PANEL
Anion gap: 7 (ref 5–15)
BUN: 11 mg/dL (ref 6–20)
CO2: 24 mmol/L (ref 22–32)
Calcium: 8.7 mg/dL — ABNORMAL LOW (ref 8.9–10.3)
Chloride: 103 mmol/L (ref 98–111)
Creatinine, Ser: 0.63 mg/dL (ref 0.44–1.00)
GFR, Estimated: >60 mL/min (ref >60)
Glucose, Bld: 150 mg/dL — ABNORMAL HIGH (ref 70–99)
Potassium: 3.1 mmol/L — ABNORMAL LOW (ref 3.5–5.1)
Sodium: 134 mmol/L — ABNORMAL LOW (ref 135–145)

## 2023-02-11 LAB — HCG, QUANTITATIVE, PREGNANCY: hCG, Beta Chain, Quant, S: <1 m[IU]/mL (ref <5)

## 2023-02-11 MED ORDER — POTASSIUM CHLORIDE CRYS ER 20 MEQ PO TBCR
40.0000 meq | EXTENDED_RELEASE_TABLET | Freq: Once | ORAL | Status: AC
  Administered 2023-02-11: 40 meq via ORAL
  Filled 2023-02-11: qty 2

## 2023-02-11 NOTE — Discharge Instructions (Addendum)
You have been seen today for your complaint of leg swelling. Your lab work showed low potassium but was otherwise reassuring. Home care instructions are as follows:  Elevate your legs when resting.  Wear compression stockings daily Follow up with: Your primary care provider within the next week Please seek immediate medical care if you develop any of the following symptoms: You have chest pain. You have shortness of breath. You have vomiting or diarrhea that lasts for more than 2 days. You faint. At this time there does not appear to be the presence of an emergent medical condition, however there is always the potential for conditions to change. Please read and follow the below instructions.  Do not take your medicine if  develop an itchy rash, swelling in your mouth or lips, or difficulty breathing; call 911 and seek immediate emergency medical attention if this occurs.  You may review your lab tests and imaging results in their entirety on your MyChart account.  Please discuss all results of fully with your primary care provider and other specialist at your follow-up visit.  Note: Portions of this text may have been transcribed using voice recognition software. Every effort was made to ensure accuracy; however, inadvertent computerized transcription errors may still be present.

## 2023-02-11 NOTE — ED Notes (Signed)
ED Provider at bedside. 

## 2023-02-11 NOTE — ED Triage Notes (Addendum)
Pt with bilateral feet and leg swelling for a month.  Also c/o ear bilaterally sound muffled and mucus to throat. Denies any fevers or N/V. Pt states her son had strep recently. Pt states she has not had her HTN med.

## 2023-02-11 NOTE — ED Provider Notes (Signed)
[place] EMERGENCY DEPARTMENT AT [facility] Provider Note   CSN: [id number] Arrival date & time: 02/11/23  1102     History  Chief Complaint  Patient presents with   Leg Swelling    Lori Henry is a 27 y.o. female.  With a history of anxiety, depression, PTSD, panic disorder, PCOS, hypertension, seizure disorder who presents to the ED for evaluation of bilateral lower extremity swelling.  She states she noticed this approximately 1 month ago.  States symptoms have progressively gotten worse.  She states she is on her feet all day at work.  She does not elevate her legs when she gets home.  She describes the sensation as a tightness in her feet and her hands.  She denies any calf pain.  She states she had similar symptoms in her third trimester of pregnancy.  She took a urine pregnancy test at home which was negative but states that the last time she was pregnant she needed a blood test to confirm.  She denies any chest pain or shortness of breath.  No abdominal pain, nausea or vomiting.  No fevers or chills.  No urinary symptoms.  No calf pain.  HPI     Home Medications Prior to Admission medications   Medication Sig Start Date End Date Taking? Authorizing Provider  amLODipine (NORVASC) 10 MG tablet Take 1 tablet (10 mg total) by mouth daily. Patient not taking: Reported on 09/28/2022 09/25/22   Katha Cabal, DO  norethindrone (ORTHO MICRONOR) 0.35 MG tablet Take 1 tablet (0.35 mg total) by mouth daily. 09/28/22   Lenice Llamas, FNP  omeprazole (PRILOSEC) 40 MG capsule Take 1 capsule (40 mg total) by mouth daily. Patient not taking: Reported on 09/28/2022 09/25/22   Katha Cabal, DO  Prenatal Vit-Fe Fumarate-FA (PRENATAL MULTIVITAMIN) TABS tablet Take 1 tablet by mouth daily at 12 noon. 12/14/21   Dominic, Courtney Heys, CNM  sertraline (ZOLOFT) 50 MG tablet Take 1 tablet (50 mg total) by mouth daily. Start with 1/2 tablet daily x 1 week, then increase to 1  tablet daily. Patient not taking: Reported on 09/28/2022 12/24/21   Hildred Laser, MD      Allergies    Bee venom    Review of Systems   Review of Systems  Cardiovascular:  Positive for leg swelling.  All other systems reviewed and are negative.   Physical Exam Updated Vital Signs BP (!) 142/90   Pulse 84   Temp 98.1 F (36.7 C) (Oral)   Resp 20   Ht 5\' 5"  (1.651 m)   Wt 134.9 kg   SpO2 98%   BMI 49.47 kg/m  Physical Exam Vitals and nursing note reviewed.  Constitutional:      General: She is not in acute distress.    Appearance: She is well-developed. She is obese. She is not ill-appearing, toxic-appearing or diaphoretic.     Comments: Resting comfortably in bed  HENT:     Head: Normocephalic and atraumatic.  Eyes:     Conjunctiva/sclera: Conjunctivae normal.  Cardiovascular:     Rate and Rhythm: Normal rate and regular rhythm.     Heart sounds: No murmur heard. Pulmonary:     Effort: Pulmonary effort is normal. No respiratory distress.     Breath sounds: Normal breath sounds. No wheezing, rhonchi or rales.  Abdominal:     Palpations: Abdomen is soft.     Tenderness: There is no abdominal tenderness. There is no guarding.  Musculoskeletal:  Cervical back: Neck supple.     Comments: Possible swelling of bilateral upper and lower extremities, however difficult to fully assess secondary to body habitus.  No pitting edema.  No erythema of the extremities.  Full AROM.  Ambulatory without difficulty.  Sensation intact in all extremities.  DP and radial pulses 2+.  Skin:    General: Skin is warm and dry.     Capillary Refill: Capillary refill takes less than 2 seconds.  Neurological:     Mental Status: She is alert.  Psychiatric:        Mood and Affect: Mood normal.     ED Results / Procedures / Treatments   Labs (all labs ordered are listed, but only abnormal results are displayed) Labs Reviewed  BASIC METABOLIC PANEL - Abnormal; Notable for the following  components:      Result Value   Sodium 134 (*)    Potassium 3.1 (*)    Glucose, Bld 150 (*)    Calcium 8.7 (*)    All other components within normal limits  URINALYSIS, ROUTINE W REFLEX MICROSCOPIC - Abnormal; Notable for the following components:   Hgb urine dipstick SMALL (*)    Bacteria, UA RARE (*)    All other components within normal limits  CBC WITH DIFFERENTIAL/PLATELET  HCG, QUANTITATIVE, PREGNANCY  BRAIN NATRIURETIC PEPTIDE    EKG None  Radiology No results found.  Procedures Procedures    Medications Ordered in ED Medications  potassium chloride SA (KLOR-CON M) CR tablet 40 mEq (has no administration in time range)    ED Course/ Medical Decision Making/ A&P                             Medical Decision Making Amount and/or Complexity of Data Reviewed Labs: ordered.  This patient presents to the ED for concern of swelling, this involves an extensive number of treatment options, and is a complaint that carries with it a high risk of complications and morbidity.  The differential diagnosis includes third spacing, overuse, CHF, pregnancy, DVT, cellulitis  Co morbidities that complicate the patient evaluation  anxiety, depression, PTSD, panic disorder, PCOS, hypertension, seizure disorder   My initial workup includes labs  Additional history obtained from: Nursing notes from this visit. Family fianc is at bedside and provides a portion of the history  I ordered, reviewed and interpreted labs which include: BMP, CBC, BNP, hCG, urinalysis  Afebrile, hypertensive but otherwise hemodynamically stable.  27 year old female presenting to the ED for evaluation of extremity swelling.  Symptoms have been present for the past month.  On exam, she appears comfortable.  She is in no acute distress.  She may have swelling to bilateral upper and lower extremities, however she is obese and this is difficult to accurately assess.  There is no pitting edema.  No erythema to  suggest infection.  She has no calf pain.  Symptoms are bilateral and involve upper and lower extremities which makes DVT significantly less likely.  Her lab work was reassuring.  Hypokalemia was treated in the emergency department.  Overall I suspect overexertion to be the cause of her swelling.  She is encouraged to elevate her extremities when lying down and to wear compression stockings.  She was encouraged to follow-up with her primary care provider regarding her symptoms.  She was given return precautions.  Stable at discharge.  At this time there does not appear to be any evidence of an  acute emergency medical condition and the patient appears stable for discharge with appropriate outpatient follow up. Diagnosis was discussed with patient who verbalizes understanding of care plan and is agreeable to discharge. I have discussed return precautions with patient who verbalizes understanding. Patient encouraged to follow-up with their PCP within 1 week. All questions answered.  Note: Portions of this report may have been transcribed using voice recognition software. Every effort was made to ensure accuracy; however, inadvertent computerized transcription errors may still be present.        Final Clinical Impression(s) / ED Diagnoses Final diagnoses:  Leg swelling  Hypokalemia    Rx / DC Orders ED Discharge Orders     None         Michelle Piper, Cordelia Poche 02/11/23 1356    Kommor, Wyn Forster, MD 02/11/23 2223

## 2023-02-11 NOTE — ED Notes (Signed)
Pt ambulated to BR for urine sample

## 2023-05-08 ENCOUNTER — Other Ambulatory Visit: Payer: Self-pay

## 2023-05-08 ENCOUNTER — Encounter (HOSPITAL_COMMUNITY): Payer: Self-pay | Admitting: Emergency Medicine

## 2023-05-08 ENCOUNTER — Emergency Department (HOSPITAL_COMMUNITY)
Admission: EM | Admit: 2023-05-08 | Discharge: 2023-05-08 | Disposition: A | Discharge status: Home / Self Care | Payer: Medicaid Other | Attending: Emergency Medicine | Admitting: Emergency Medicine

## 2023-05-08 DIAGNOSIS — H5712 Ocular pain, left eye: Secondary | ICD-10-CM | POA: Diagnosis present

## 2023-05-08 DIAGNOSIS — H1032 Unspecified acute conjunctivitis, left eye: Secondary | ICD-10-CM | POA: Diagnosis not present

## 2023-05-08 LAB — GROUP A STREP BY PCR: Group A Strep by PCR: NOT DETECTED

## 2023-05-08 MED ORDER — FLUORESCEIN SODIUM 1 MG OP STRP
1.0000 | ORAL_STRIP | Freq: Once | OPHTHALMIC | Status: AC
  Administered 2023-05-08: 1 via OPHTHALMIC
  Filled 2023-05-08: qty 1

## 2023-05-08 MED ORDER — ERYTHROMYCIN 5 MG/GM OP OINT: TOPICAL_OINTMENT | Freq: Three times a day (TID) | OPHTHALMIC | 0 refills | Status: AC

## 2023-05-08 MED ORDER — ERYTHROMYCIN 5 MG/GM OP OINT
TOPICAL_OINTMENT | Freq: Once | OPHTHALMIC | Status: AC
  Administered 2023-05-08: 1 via OPHTHALMIC
  Filled 2023-05-08: qty 3.5

## 2023-05-08 MED ORDER — TETRACAINE HCL 0.5 % OP SOLN
2.0000 [drp] | Freq: Once | OPHTHALMIC | Status: AC
  Administered 2023-05-08: 2 [drp] via OPHTHALMIC
  Filled 2023-05-08: qty 4

## 2023-05-08 NOTE — ED Triage Notes (Signed)
Pt with c/o L eye pain and swelling (denies injury) since yesterday. States her eye has had a "white, milky discharge" and that it was "stuck together" this morning when she woke up. States she has tried OTC eye drops without relief.

## 2023-05-08 NOTE — ED Provider Notes (Signed)
[name] EMERGENCY DEPARTMENT AT [facility] Provider Note   CSN: [id number] Arrival date & time: 05/08/23  0500     History  Chief Complaint  Patient presents with   L eye pain and swelling    Lori Henry is a 27 y.o. female.  27 yo f here with left eye pain and discharge starting a few hours ago.  States that when she woke up it was swollen shut.  Burning on the front of her eye.  Says some mild blurry vision.  She is a strep carrier and has had sore throat for the last few days with runny nose and cough as well.  No trauma.  No known foreign bodies.        Home Medications Prior to Admission medications   Medication Sig Start Date End Date Taking? Authorizing Provider  erythromycin ophthalmic ointment Place into the left eye 3 (three) times daily for 7 days. Place a 1/2 inch ribbon of ointment into the lower eyelid. 05/08/23 05/15/23 Yes Christyl Osentoski, Barbara Cower, MD  norethindrone (ORTHO MICRONOR) 0.35 MG tablet Take 1 tablet (0.35 mg total) by mouth daily. 09/28/22   Lenice Llamas, FNP  Prenatal Vit-Fe Fumarate-FA (PRENATAL MULTIVITAMIN) TABS tablet Take 1 tablet by mouth daily at 12 noon. 12/14/21   Dominic, Courtney Heys, CNM      Allergies    Bee venom    Review of Systems   Review of Systems  Physical Exam Updated Vital Signs BP (!) 156/122   Pulse 81   Temp 98.9 F (37.2 C) (Oral)   Resp 16   Ht 5\' 5"  (1.651 m)   Wt 122.5 kg   SpO2 99%   BMI 44.93 kg/m  Physical Exam Vitals and nursing note reviewed.  Constitutional:      Appearance: She is well-developed.  HENT:     Head: Normocephalic and atraumatic.  Eyes:     Comments: Left eye conjunctival injection, pupil equal round reactive to light to the right.  Woods lamp with fluorescein staining does not show any obvious abrasions or foreign bodies.  Had pain relief almost 100% with the tetracaine drops.  Extraocular movements are intact.  No pain with consensual extraocular movement testing.   Tono-Pen pressure is 21 with 95% confidence interval.  No proptosis.  Cardiovascular:     Rate and Rhythm: Normal rate and regular rhythm.  Pulmonary:     Effort: No respiratory distress.     Breath sounds: No stridor.  Abdominal:     General: There is no distension.  Musculoskeletal:     Cervical back: Normal range of motion.  Neurological:     Mental Status: She is alert.     ED Results / Procedures / Treatments   Labs (all labs ordered are listed, but only abnormal results are displayed) Labs Reviewed  GROUP A STREP BY PCR    EKG None  Radiology No results found.  Procedures Procedures    Medications Ordered in ED Medications  tetracaine (PONTOCAINE) 0.5 % ophthalmic solution 2 drop (2 drops Left Eye Given 05/08/23 8295)  fluorescein ophthalmic strip 1 strip (1 strip Left Eye Given 05/08/23 6213)  erythromycin ophthalmic ointment (1 Application Left Eye Given 05/08/23 0636)    ED Course/ Medical Decision Making/ A&P                                 Medical Decision Making Risk Prescription drug  management.   Likely bacterial conjunctivitis with her history of strep and having what sound like strep throat recently.  No evidence of glaucoma, orbital cellulitis, glaucoma on exam or history.  Will prescribe erythromycin ointment and follow-up with Optho if not feeling better in few days.  Will also swab her throat which is still pending to make sure she does not have strep throat which would obviously require different course of antibiotics. Strep negative. Erythromycin ointment provided. Fu w/ ophtho if worsens or no improvement. Here if loss of vision, proptosis or e/o orbial cellulitis.    Final Clinical Impression(s) / ED Diagnoses Final diagnoses:  Acute bacterial conjunctivitis of left eye    Rx / DC Orders ED Discharge Orders          Ordered    erythromycin ophthalmic ointment  3 times daily        05/08/23 0724              Eustace Hur, Barbara Cower,  MD 05/08/23 2326

## 2023-06-20 ENCOUNTER — Emergency Department (HOSPITAL_COMMUNITY)
Admission: EM | Admit: 2023-06-20 | Discharge: 2023-06-20 | Disposition: A | Discharge status: Home / Self Care | Payer: Medicaid Other | Attending: Emergency Medicine | Admitting: Emergency Medicine

## 2023-06-20 ENCOUNTER — Other Ambulatory Visit: Payer: Self-pay

## 2023-06-20 ENCOUNTER — Encounter (HOSPITAL_COMMUNITY): Payer: Self-pay

## 2023-06-20 DIAGNOSIS — J029 Acute pharyngitis, unspecified: Secondary | ICD-10-CM | POA: Diagnosis present

## 2023-06-20 DIAGNOSIS — B3731 Acute candidiasis of vulva and vagina: Secondary | ICD-10-CM | POA: Diagnosis not present

## 2023-06-20 LAB — WET PREP, GENITAL
Clue Cells Wet Prep HPF POC: NONE SEEN
Sperm: NONE SEEN
Trich, Wet Prep: NONE SEEN
WBC, Wet Prep HPF POC: <10 (ref <10)
Yeast Wet Prep HPF POC: NONE SEEN

## 2023-06-20 LAB — BASIC METABOLIC PANEL
Anion gap: 10 (ref 5–15)
BUN: 10 mg/dL (ref 6–20)
CO2: 22 mmol/L (ref 22–32)
Calcium: 9.1 mg/dL (ref 8.9–10.3)
Chloride: 103 mmol/L (ref 98–111)
Creatinine, Ser: 0.6 mg/dL (ref 0.44–1.00)
GFR, Estimated: >60 mL/min (ref >60)
Glucose, Bld: 225 mg/dL — ABNORMAL HIGH (ref 70–99)
Potassium: 3.5 mmol/L (ref 3.5–5.1)
Sodium: 135 mmol/L (ref 135–145)

## 2023-06-20 LAB — URINALYSIS, ROUTINE W REFLEX MICROSCOPIC
Bilirubin Urine: NEGATIVE
Glucose, UA: NEGATIVE mg/dL
Hgb urine dipstick: NEGATIVE
Ketones, ur: NEGATIVE mg/dL
Nitrite: NEGATIVE
Protein, ur: 30 mg/dL — AB
Specific Gravity, Urine: 1.016 (ref 1.005–1.030)
pH: 6 (ref 5.0–8.0)

## 2023-06-20 LAB — CBC WITH DIFFERENTIAL/PLATELET
Abs Immature Granulocytes: 0.04 10*3/uL (ref 0.00–0.07)
Basophils Absolute: 0.1 10*3/uL (ref 0.0–0.1)
Basophils Relative: 1 %
Eosinophils Absolute: 0.2 10*3/uL (ref 0.0–0.5)
Eosinophils Relative: 2 %
HCT: 40.5 % (ref 36.0–46.0)
Hemoglobin: 13 g/dL (ref 12.0–15.0)
Immature Granulocytes: 0 %
Lymphocytes Relative: 26 %
Lymphs Abs: 2.7 10*3/uL (ref 0.7–4.0)
MCH: 27 pg (ref 26.0–34.0)
MCHC: 32.1 g/dL (ref 30.0–36.0)
MCV: 84 fL (ref 80.0–100.0)
Monocytes Absolute: 0.6 10*3/uL (ref 0.1–1.0)
Monocytes Relative: 6 %
Neutro Abs: 6.8 10*3/uL (ref 1.7–7.7)
Neutrophils Relative %: 65 %
Platelets: 292 10*3/uL (ref 150–400)
RBC: 4.82 MIL/uL (ref 3.87–5.11)
RDW: 13.8 % (ref 11.5–15.5)
WBC: 10.3 10*3/uL (ref 4.0–10.5)
nRBC: 0 % (ref 0.0–0.2)

## 2023-06-20 LAB — MONONUCLEOSIS SCREEN: Mono Screen: NEGATIVE

## 2023-06-20 LAB — PREGNANCY, URINE: Preg Test, Ur: NEGATIVE

## 2023-06-20 LAB — GROUP A STREP BY PCR: Group A Strep by PCR: NOT DETECTED

## 2023-06-20 MED ORDER — LIDOCAINE VISCOUS HCL 2 % MT SOLN: 15.0000 mL | Freq: Four times a day (QID) | OROMUCOSAL | 0 refills | Status: DC | PRN

## 2023-06-20 MED ORDER — FLUCONAZOLE 150 MG PO TABS: 150.0000 mg | ORAL_TABLET | Freq: Once | ORAL | 0 refills | Status: AC

## 2023-06-20 MED ORDER — DEXAMETHASONE SODIUM PHOSPHATE 10 MG/ML IJ SOLN
10.0000 mg | Freq: Once | INTRAMUSCULAR | Status: AC
  Administered 2023-06-20: 10 mg via INTRAMUSCULAR
  Filled 2023-06-20: qty 1

## 2023-06-20 NOTE — ED Notes (Signed)
ED Provider at bedside. 

## 2023-06-20 NOTE — ED Triage Notes (Signed)
Pt c/o difficulty swallowing for 2 months. Pt states is hard to swallow water. Pt also has complaint of vaginal discharge for over 4 months (thick white color.)

## 2023-06-20 NOTE — ED Provider Triage Note (Signed)
Emergency Medicine Provider Triage Evaluation Note  Lori Henry , a 27 y.o. female  was evaluated in triage.  Pt complains of sore throat and vaginal itching/discharge.  Review of Systems  Positive: Productive cough Negative: Fever, dysuria, abdominal pain, chest pain, diarrhea, shortness of breath.  Physical Exam  BP (!) 168/143 (BP Location: Right Wrist)   Pulse 99   Temp 98.2 F (36.8 C)   Resp 17   Ht 5\' 5"  (1.651 m)   Wt (!) 137.6 kg   LMP 04/11/2023 (Exact Date)   SpO2 93%   BMI 50.49 kg/m  Gen:   Awake, no distress   Resp:  Normal effort  MSK:   Moves extremities without difficulty  Other:  Speech sounds slightly muffled.  Medical Decision Making  Medically screening exam initiated at 4:49 PM.  Appropriate orders placed.  [name] was informed that the remainder of the evaluation will be completed by another provider, this initial triage assessment does not replace that evaluation, and the importance of remaining in the ED until their evaluation is complete.  Patient presents today complaining of sore throat for the last 4 days and vaginal itching/discharge happening for the last 2 months.  Previously suffered from a cold 1 month ago and has gotten better but cough remained.  States she could be pregnant.   Lunette Stands, [place] 06/20/23 1705

## 2023-06-20 NOTE — ED Provider Notes (Signed)
[place] EMERGENCY DEPARTMENT AT [facility] Provider Note   CSN: [id number] Arrival date & time: 06/20/23  1618     History  Chief Complaint  Patient presents with  . Sore Throat    Lori Henry is a 27 y.o. female.  Patient presents the ED today with a 4-day history of increased sore throat and difficulty eating.  She states that she had a URI 1 month ago that has since gotten better but the mucus production and sore throat has remained.  She has tried Tylenol and ibuprofen without much relief.  Came into the ER today because she started to have difficulty swallowing with both liquids and food.  She states her voice has not changed.  But is having increased mucus production and continues to have productive cough without wheezing or difficulty breathing.  Denies diarrhea, chest pain, abdominal pain, fever.   Patient also is complaining of a 35-month history of vaginal discharge and itching pain.  She describes discharge as white and curd-like.  She states that she has had something similar before when she was pregnant.  However did not last this long.  Denies dysuria.    Sore Throat      Home Medications Prior to Admission medications   Medication Sig Start Date End Date Taking? Authorizing Provider  fluconazole (DIFLUCAN) 150 MG tablet Take 1 tablet (150 mg total) by mouth once for 1 dose. 06/20/23 06/20/23 Yes Lunette Stands, PA-C  lidocaine (XYLOCAINE) 2 % solution Use as directed 15 mLs in the mouth or throat every 6 (six) hours as needed for mouth pain. 06/20/23  Yes Lunette Stands, PA-C  norethindrone (ORTHO MICRONOR) 0.35 MG tablet Take 1 tablet (0.35 mg total) by mouth daily. 09/28/22   Lenice Llamas, FNP  Prenatal Vit-Fe Fumarate-FA (PRENATAL MULTIVITAMIN) TABS tablet Take 1 tablet by mouth daily at 12 noon. 12/14/21   Dominic, Courtney Heys, CNM      Allergies    Bee venom    Review of Systems   Review of Systems  HENT:  Positive for congestion,  sore throat and trouble swallowing. Negative for drooling and voice change.   Genitourinary:  Positive for vaginal discharge.  All other systems reviewed and are negative.   Physical Exam Updated Vital Signs BP (!) 187/136   Pulse 85   Temp 98.2 F (36.8 C)   Resp 19   Ht 5\' 5"  (1.651 m)   Wt (!) 137.6 kg   LMP 04/11/2023 (Exact Date)   SpO2 97%   BMI 50.49 kg/m  Physical Exam Exam conducted with a chaperone present.  HENT:     Head: Normocephalic.     Nose: Congestion present.     Mouth/Throat:     Mouth: Mucous membranes are moist.     Pharynx: Pharyngeal swelling and posterior oropharyngeal erythema present. No oropharyngeal exudate.     Tonsils: No tonsillar exudate or tonsillar abscesses. 3+ on the right. 3+ on the left.  Eyes:     Conjunctiva/sclera: Conjunctivae normal.  Cardiovascular:     Rate and Rhythm: Normal rate and regular rhythm.     Heart sounds: Normal heart sounds.  Pulmonary:     Effort: Pulmonary effort is normal. No respiratory distress.     Breath sounds: Normal breath sounds. No wheezing, rhonchi or rales.  Abdominal:     General: There is no distension.     Palpations: Abdomen is soft.     Tenderness: There is no abdominal tenderness.  Genitourinary:    General: Normal vulva.     Labia:        Right: No tenderness, lesion or injury.        Left: No tenderness, lesion or injury.      Vagina: Normal.     Cervix: Discharge present.     Comments: White curd-like vaginal discharge present in vaginal canal Skin:    General: Skin is warm and dry.  Neurological:     General: No focal deficit present.     Mental Status: She is alert.    ED Results / Procedures / Treatments   Labs (all labs ordered are listed, but only abnormal results are displayed) Labs Reviewed  BASIC METABOLIC PANEL - Abnormal; Notable for the following components:      Result Value   Glucose, Bld 225 (*)    All other components within normal limits  URINALYSIS, ROUTINE  W REFLEX MICROSCOPIC - Abnormal; Notable for the following components:   APPearance HAZY (*)    Protein, ur 30 (*)    Leukocytes,Ua TRACE (*)    Bacteria, UA RARE (*)    All other components within normal limits  GROUP A STREP BY PCR  WET PREP, GENITAL  CBC WITH DIFFERENTIAL/PLATELET  PREGNANCY, URINE  MONONUCLEOSIS SCREEN  GC/CHLAMYDIA PROBE AMP ([place]) NOT AT [facility]    EKG None  Radiology No results found.  Procedures Procedures    Medications Ordered in ED Medications  dexamethasone (DECADRON) injection 10 mg (10 mg Intramuscular Given 06/20/23 1951)    ED Course/ Medical Decision Making/ A&P                                 Medical Decision Making Amount and/or Complexity of Data Reviewed Labs: ordered.     Patient presents to the ED for concern of sore throat and difficulty swallowing, this involves an extensive number of treatment options, and is a complaint that carries with it a high risk of complications and morbidity.  The differential diagnosis includes strep, mono, epiglottitis,   Co morbidities that complicate the patient evaluation  Weight 137.6 kg History of recurrent UTI   Additional history obtained:  Additional history obtained from  Nursing, Outside Medical Records, and Past Admission    Lab Tests:  I Ordered, and personally interpreted labs.  The pertinent results include:   UA --hazy, trace leukocytes, bacteria rare.      Imaging Studies ordered:  No imaging studies were necessary for this visit.   Cardiac Monitoring:  No cardiac monitoring necessary   Medicines ordered and prescription drug management:  I ordered medication including decadron  for sore throat and swollen tonsils   Reevaluation of the patient after these medicines showed that the patient improved I have reviewed the patients home medicines and have made adjustments as needed   Test Considered:  CT Head/neck -- considered if peritonsillar abscess  symptoms present.   Critical Interventions:  Dexamethasone provided to help with sore throat and tonsillar pain   Consultations Obtained:  I requested consultation with the attending,  and discussed lab and imaging findings as well as pertinent plan - he recommend: Patient be discharged with fluconazole.  And given dexamethasone in the ER to help with throat pain and difficulty swallowing.  Patient was low risk for emergent pathology.  And should be followed up outpatient if symptoms persist.  But should return to the ER if symptoms  worsen   Problem List / ED Course:  Sore throat/difficulty swallowing --patient presents with a 4-day history of sore throat" with swallowing.  She states that she had a respiratory infection 1 month ago that had gotten better.  She came in today due to choking on food when trying to eat.  Denies fever, shortness of breath, chest pain, abdominal pain, diarrhea, constipation, dysuria, fatigue.  Labs were unremarkable.  Patient was given a single shot of dexamethasone to help with inflammation in throat.  Discharged with viscous lidocaine to help when eating.  Return precautions were given. Vaginal discharge --patient also complained of 15-month history of white curd-like vaginal discharge that accompanied by itching.  She states that she has never had a yeast infection prior.  Patient is only sexually active with 1 partner for the last 4 years.  Denies hematuria, dyspareunia.  Wet prep came back negative and GC and chlamydia are still pending.   However due to symptoms fluconazole was still prescribed outpatient for her to take one 150 mg tablet.  If symptoms do not resolve recommend follow-up with PCP or GYN.   Reevaluation:  After the interventions noted above, I reevaluated the patient and found that they have :improved   Social Determinants of Health:  Multiple ED visits within the last 3 months.   Dispostion:  After consideration of the diagnostic  results and the patients response to treatment, I feel that the patent would benefit from discharge with indications noted as above.   Final Clinical Impression(s) / ED Diagnoses Final diagnoses:  Sore throat  Vaginal candidiasis    Rx / DC Orders ED Discharge Orders          Ordered    fluconazole (DIFLUCAN) 150 MG tablet   Once        06/20/23 2004    lidocaine (XYLOCAINE) 2 % solution  Every 6 hours PRN        06/20/23 2018              Lavonia Drafts 06/20/23 2043    Eber Hong, MD 06/21/23 [phone]

## 2023-06-20 NOTE — ED Notes (Signed)
Pt states she does have a hx of hypertension, not medicated.

## 2023-06-20 NOTE — Discharge Instructions (Addendum)
For throat pain and difficulty swallowing the dexamethasone given in the ER should help with symptoms in 4 to 5 hours and be able to not have to send you home with oral steroids.  I am also sending you with some viscous lidocaine to help with the throat soreness when eating and drinking.  You can gargle or swallow it.  Be sure to not take it more than every 6 hours. If your symptoms worsen where you start to develop shortness of breath, unable to swallow food/liquids due to choking, fever follow-ups, voice begins to sound muffled.   Take the fluconazole 150 mg tablet 1 time for vaginal candidiasis.  Symptoms should resolve shortly thereafter.  If symptoms do not resolve schedule appointment with PCP or GYN for further follow-up.

## 2023-06-21 LAB — GC/CHLAMYDIA PROBE AMP (~~LOC~~) NOT AT ARMC
Chlamydia: NEGATIVE
Comment: NEGATIVE
Comment: NORMAL
Neisseria Gonorrhea: NEGATIVE

## 2023-08-04 DIAGNOSIS — N200 Calculus of kidney: Secondary | ICD-10-CM

## 2023-08-04 HISTORY — DX: Calculus of kidney: N20.0

## 2023-10-14 ENCOUNTER — Other Ambulatory Visit: Payer: Self-pay

## 2023-10-14 ENCOUNTER — Emergency Department (HOSPITAL_COMMUNITY)

## 2023-10-14 ENCOUNTER — Emergency Department (HOSPITAL_COMMUNITY)
Admission: EM | Admit: 2023-10-14 | Discharge: 2023-10-14 | Disposition: A | Discharge status: Home / Self Care | Attending: Emergency Medicine | Admitting: Emergency Medicine

## 2023-10-14 ENCOUNTER — Encounter (HOSPITAL_COMMUNITY): Payer: Self-pay | Admitting: *Deleted

## 2023-10-14 DIAGNOSIS — M25572 Pain in left ankle and joints of left foot: Secondary | ICD-10-CM | POA: Insufficient documentation

## 2023-10-14 DIAGNOSIS — M25561 Pain in right knee: Secondary | ICD-10-CM | POA: Diagnosis not present

## 2023-10-14 DIAGNOSIS — Y990 Civilian activity done for income or pay: Secondary | ICD-10-CM | POA: Insufficient documentation

## 2023-10-14 DIAGNOSIS — W108XXA Fall (on) (from) other stairs and steps, initial encounter: Secondary | ICD-10-CM | POA: Insufficient documentation

## 2023-10-14 DIAGNOSIS — I10 Essential (primary) hypertension: Secondary | ICD-10-CM | POA: Insufficient documentation

## 2023-10-14 DIAGNOSIS — Z79899 Other long term (current) drug therapy: Secondary | ICD-10-CM | POA: Insufficient documentation

## 2023-10-14 DIAGNOSIS — W19XXXA Unspecified fall, initial encounter: Secondary | ICD-10-CM

## 2023-10-14 LAB — PREGNANCY, URINE: Preg Test, Ur: NEGATIVE

## 2023-10-14 MED ORDER — ACETAMINOPHEN 325 MG PO TABS
650.0000 mg | ORAL_TABLET | Freq: Once | ORAL | Status: AC
  Administered 2023-10-14: 650 mg via ORAL
  Filled 2023-10-14: qty 2

## 2023-10-14 MED ORDER — KETOROLAC TROMETHAMINE 15 MG/ML IJ SOLN
15.0000 mg | Freq: Once | INTRAMUSCULAR | Status: AC
  Administered 2023-10-14: 15 mg via INTRAMUSCULAR
  Filled 2023-10-14: qty 1

## 2023-10-14 NOTE — Discharge Instructions (Signed)
 Please follow-up with your primary care provider in regards to recent ER visit. Today's physical exam and imaging was reassuring and you most likely have a benign process going on. You may rest, ice, compress, elevate your extremity and use Tylenol or ibuprofen every 6 hours as needed for pain. If you begin to have decreased sensation, skin color changes, pain out of proportion/not controlled by over-the-counter medications, rigid compartments, or other worsening of symptoms please return to ER.

## 2023-10-14 NOTE — ED Provider Notes (Signed)
 [place] EMERGENCY DEPARTMENT AT [facility] Provider Note   CSN: [id number] Arrival date & time: 10/14/23  0236     History  Chief Complaint  Patient presents with   Lori Henry is a 28 y.o. female history of seizures, anxiety depression, hypertension presented after mechanical fall where she landed on her right knee.  Patient was at work when she missed a step and landed on her right knee.  Patient not hit head or neck and denies LOC or blood thinners.  Patient has not been to bear weight since then states that her leg is not rotated outward.  Patient still move and feel her toes.  Patient has any hip pain.  Patient dates the pain is in her right knee and just above her knee.  Home Medications Prior to Admission medications   Medication Sig Start Date End Date Taking? Authorizing Provider  lidocaine (XYLOCAINE) 2 % solution Use as directed 15 mLs in the mouth or throat every 6 (six) hours as needed for mouth pain. 06/20/23   Lunette Stands, PA-C  norethindrone (ORTHO MICRONOR) 0.35 MG tablet Take 1 tablet (0.35 mg total) by mouth daily. 09/28/22   Lenice Llamas, FNP  Prenatal Vit-Fe Fumarate-FA (PRENATAL MULTIVITAMIN) TABS tablet Take 1 tablet by mouth daily at 12 noon. 12/14/21   Dominic, Courtney Heys, CNM      Allergies    Bee venom    Review of Systems   Review of Systems  Physical Exam Updated Vital Signs BP (!) 177/121   Pulse (!) 109   Temp 98.2 F (36.8 C)   Resp 16   Ht 5\' 5"  (1.651 m)   Wt (!) 137.6 kg   SpO2 100%   BMI 50.48 kg/m  Physical Exam Vitals reviewed.  Constitutional:      General: She is not in acute distress. Cardiovascular:     Rate and Rhythm: Normal rate.     Pulses: Normal pulses.  Musculoskeletal:     Comments: Right knee: Hematoma noted on the medial aspect and tender to palpation, no bony tenderness, limited range of motion due to pain however 4 out of 5 knee extension, able to wiggle toes, 5 out of 5 hip  flexion bilaterally No pelvic tenderness Negative logroll bilaterally Pain not out of proportion Soft compartments 5 out of 5 bilateral plantarflexion and dorsiflexion  Skin:    General: Skin is warm and dry.     Capillary Refill: Capillary refill takes less than 2 seconds.  Neurological:     Mental Status: She is alert.     Comments: Sensation intact distally  Psychiatric:        Mood and Affect: Mood normal.     ED Results / Procedures / Treatments   Labs (all labs ordered are listed, but only abnormal results are displayed) Labs Reviewed  PREGNANCY, URINE    EKG None  Radiology DG Ankle Complete Left Result Date: 10/14/2023 CLINICAL DATA:  Fall at work with twisted ankle. EXAM: LEFT ANKLE COMPLETE - 3 VIEW COMPARISON:  Left leg series 04/21/2018. FINDINGS: There is no evidence of fracture, dislocation, or joint effusion. Possible regional soft tissue swelling. IMPRESSION: Negative for fracture or subluxation. Electronically Signed   By: Tiburcio Pea M.D.   On: 10/14/2023 05:08   DG Hip Unilat With Pelvis 2-3 Views Right Result Date: 10/14/2023 CLINICAL DATA:  Fall at work with right leg pain EXAM: DG HIP (WITH OR WITHOUT PELVIS) 3V RIGHT  COMPARISON:  None Available. FINDINGS: There is no evidence of hip fracture or dislocation. There is no evidence of arthropathy or other focal bone abnormality. IMPRESSION: Negative. Electronically Signed   By: Tiburcio Pea M.D.   On: 10/14/2023 05:07   DG Knee Complete 4 Views Right Result Date: 10/14/2023 CLINICAL DATA:  Fall with right leg pain EXAM: RIGHT KNEE - COMPLETE 4 VIEW COMPARISON:  None Available. FINDINGS: Extensive subcutaneous stranding to the anterior knee. No joint effusion, fracture, or subluxation. IMPRESSION: Generalized soft tissue swelling anterior to the knee. No fracture or subluxation. Electronically Signed   By: Tiburcio Pea M.D.   On: 10/14/2023 05:06    Procedures Procedures    Medications Ordered in  ED Medications  ketorolac (TORADOL) 15 MG/ML injection 15 mg (has no administration in time range)  acetaminophen (TYLENOL) tablet 650 mg (650 mg Oral Given 10/14/23 0301)    ED Course/ Medical Decision Making/ A&P                                 Medical Decision Making Amount and/or Complexity of Data Reviewed Labs: ordered. Radiology: ordered.  Risk Prescription drug management.   Lori Henry 28 y.o. presented today for right knee pain, left ankle pain. Working DDx that I considered at this time includes, but not limited to, contusion, strain/sprain, fracture, dislocation, neurovascular compromise, septic joint, ischemic limb, compartment syndrome.  R/o DDx: strain/sprain, fracture, dislocation, neurovascular compromise, septic joint, ischemic limb, compartment syndrome: These are considered less likely due to history of present illness, physical exam, labs/imaging findings.  Review of prior external notes: 06/20/2023 ED  Unique Tests and My Independent Interpretation:  Urine pregnancy: Negative Left ankle x-ray: No acute findings Right hip x-ray: Unremarkable Right knee x-ray: Unremarkable  Social Determinants of Health: none  Discussion with Independent Historian:  Significant other  Discussion of Management of Tests: None  Risk: Medium: prescription drug management  Risk Stratification Score: None  Plan: On exam patient was in no acute distress but does appear uncomfortable on exam.  Patient was neuro vas intact and does have hematoma with ecchymosis noted to the medial aspect of her right knee.  Patient is able to range the right knee but is limited due to pain.  X-rays was obtained of right knee and right hip this patient states she was concerned that her leg was externally rotated which was ultimately negative.  Patient endorsed to the x-ray tech that she was also having left ankle pain and so imaging was obtained of this as well which was ultimately  negative.  I tried to ambulate the patient however patient does not want to bear weight on her right knee.  I offered a CT scan to look for possible tibial plateau fracture however patient declined at this time states she would like to be discharged.  Will give shot of Toradol here along with crutches and Ace wrap.  Will have her follow-up with her primary care provider.  Patient's physical exam and imaging are reassuring.  I spoke to the patient about RICE therapy including Tylenol every 6 hours needed pain, ice 3-4 times daily for 15 minutes at a time, elevation of extremity, using a brace and to follow-up with their primary care provider.  Patient was given return precautions. Patient stable for discharge at this time.  Patient verbalized understanding of plan.  This chart was dictated using voice recognition software.  Despite best  efforts to proofread,  errors can occur which can change the documentation meaning.         Final Clinical Impression(s) / ED Diagnoses Final diagnoses:  Fall, initial encounter  Acute left ankle pain  Acute pain of right knee    Rx / DC Orders ED Discharge Orders     None         Remi Deter 10/14/23 0538    Maia Plan, MD 10/14/23 [phone]

## 2023-10-14 NOTE — ED Triage Notes (Signed)
 The pt reports that she was at work around 0130 and was stepping out of the building and missed the step  she fell onto her entire rt leg pain worse around the mid-thigh  down her entire lower leg  she also twisted her lt ankle  no head injury no loc  lmp none

## 2023-10-28 ENCOUNTER — Encounter: Payer: Self-pay | Admitting: Emergency Medicine

## 2023-10-28 ENCOUNTER — Other Ambulatory Visit: Payer: Self-pay

## 2023-10-28 DIAGNOSIS — I10 Essential (primary) hypertension: Secondary | ICD-10-CM | POA: Diagnosis not present

## 2023-10-28 DIAGNOSIS — N390 Urinary tract infection, site not specified: Secondary | ICD-10-CM | POA: Diagnosis not present

## 2023-10-28 DIAGNOSIS — R350 Frequency of micturition: Secondary | ICD-10-CM | POA: Diagnosis present

## 2023-10-28 NOTE — ED Triage Notes (Addendum)
 Patient ambulatory to triage with steady gait, without difficulty or distress noted; pt reports for 1-2 mos having urinary urgency/frequency, diarrhea after eating; denies abd pain

## 2023-10-29 ENCOUNTER — Emergency Department
Admission: EM | Admit: 2023-10-29 | Discharge: 2023-10-29 | Disposition: A | Discharge status: Home / Self Care | Attending: Emergency Medicine | Admitting: Emergency Medicine

## 2023-10-29 DIAGNOSIS — N390 Urinary tract infection, site not specified: Secondary | ICD-10-CM

## 2023-10-29 LAB — CBC WITH DIFFERENTIAL/PLATELET
Abs Immature Granulocytes: 0.06 10*3/uL (ref 0.00–0.07)
Basophils Absolute: 0 10*3/uL (ref 0.0–0.1)
Basophils Relative: 0 %
Eosinophils Absolute: 0.1 10*3/uL (ref 0.0–0.5)
Eosinophils Relative: 1 %
HCT: 40.4 % (ref 36.0–46.0)
Hemoglobin: 12.7 g/dL (ref 12.0–15.0)
Immature Granulocytes: 1 %
Lymphocytes Relative: 16 %
Lymphs Abs: 1.7 10*3/uL (ref 0.7–4.0)
MCH: 26.7 pg (ref 26.0–34.0)
MCHC: 31.4 g/dL (ref 30.0–36.0)
MCV: 85.1 fL (ref 80.0–100.0)
Monocytes Absolute: 0.5 10*3/uL (ref 0.1–1.0)
Monocytes Relative: 5 %
Neutro Abs: 8 10*3/uL — ABNORMAL HIGH (ref 1.7–7.7)
Neutrophils Relative %: 77 %
Platelets: 371 10*3/uL (ref 150–400)
RBC: 4.75 MIL/uL (ref 3.87–5.11)
RDW: 13.2 % (ref 11.5–15.5)
WBC: 10.4 10*3/uL (ref 4.0–10.5)
nRBC: 0 % (ref 0.0–0.2)

## 2023-10-29 LAB — COMPREHENSIVE METABOLIC PANEL WITH GFR
ALT: 54 U/L — ABNORMAL HIGH (ref 0–44)
AST: 45 U/L — ABNORMAL HIGH (ref 15–41)
Albumin: 4 g/dL (ref 3.5–5.0)
Alkaline Phosphatase: 108 U/L (ref 38–126)
Anion gap: 7 (ref 5–15)
BUN: 12 mg/dL (ref 6–20)
CO2: 27 mmol/L (ref 22–32)
Calcium: 9.6 mg/dL (ref 8.9–10.3)
Chloride: 104 mmol/L (ref 98–111)
Creatinine, Ser: 0.6 mg/dL (ref 0.44–1.00)
GFR, Estimated: >60 mL/min (ref >60)
Glucose, Bld: 186 mg/dL — ABNORMAL HIGH (ref 70–99)
Potassium: 3.3 mmol/L — ABNORMAL LOW (ref 3.5–5.1)
Sodium: 138 mmol/L (ref 135–145)
Total Bilirubin: 0.7 mg/dL (ref 0.0–1.2)
Total Protein: 8.4 g/dL — ABNORMAL HIGH (ref 6.5–8.1)

## 2023-10-29 LAB — URINALYSIS, ROUTINE W REFLEX MICROSCOPIC
Bilirubin Urine: NEGATIVE
Glucose, UA: 50 mg/dL — AB
Hgb urine dipstick: NEGATIVE
Ketones, ur: NEGATIVE mg/dL
Nitrite: NEGATIVE
Protein, ur: 100 mg/dL — AB
Specific Gravity, Urine: 1.027 (ref 1.005–1.030)
pH: 5 (ref 5.0–8.0)

## 2023-10-29 LAB — LIPASE, BLOOD: Lipase: 36 U/L (ref 11–51)

## 2023-10-29 LAB — POC URINE PREG, ED: Preg Test, Ur: NEGATIVE

## 2023-10-29 MED ORDER — CEPHALEXIN 500 MG PO CAPS: 500.0000 mg | ORAL_CAPSULE | Freq: Four times a day (QID) | ORAL | 0 refills | Status: AC

## 2023-10-29 MED ORDER — CEPHALEXIN 500 MG PO CAPS
500.0000 mg | ORAL_CAPSULE | Freq: Once | ORAL | Status: AC
  Administered 2023-10-29: 500 mg via ORAL
  Filled 2023-10-29: qty 1

## 2023-10-29 NOTE — Discharge Instructions (Signed)
 Take antibiotics for the full 5-day course as prescribed.  For your diarrhea try loperamide, find this medication over-the-counter and take as directed.  If you continue to have diarrhea over the next several days call your doctor for follow-up appointment.  Thank you for choosing [place] for your health care today!  Please see your primary doctor this week for a follow up appointment.   If you have any new, worsening, or unexpected symptoms call your doctor right away or come back to the emergency department for reevaluation.  It was my pleasure to care for you today.   Daneil Dan Modesto Charon, MD

## 2023-10-29 NOTE — ED Notes (Signed)
 ED Provider at bedside.

## 2023-10-29 NOTE — ED Provider Notes (Signed)
 [facility] Provider Note    Event Date/Time   First MD Initiated Contact with Patient 10/29/23 0124     (approximate)   History   Urinary Frequency   HPI  Lori Henry is a 28 y.o. female   Past medical history of anxiety depression, migraines and hypertension who presents to the emergency department with urinary frequency.  Several days of symptoms.  No fevers or chills.  She also has has questions about the healing process of her right knee after she had an injury and the bruising has persisted over the last several days.  She is able to walk.  She has also had some diarrhea over the last 1 week.  No bleeding.  No recent antibiotic use or foreign travel.  She is also felt dizzy over the last several months, intermittently, no motor or sensory changes, and she states "I feel better after I eat"  Independent Historian contributed to assessment above: Her fianc is at bedside to corroborate information past medical history as above      Physical Exam   Triage Vital Signs: ED Triage Vitals  Encounter Vitals Group     BP 10/28/23 2350 (!) 160/106     Systolic BP Percentile --      Diastolic BP Percentile --      Pulse Rate 10/28/23 2350 (!) 102     Resp 10/28/23 2350 18     Temp 10/28/23 2350 98.3 F (36.8 C)     Temp Source 10/28/23 2350 Oral     SpO2 10/28/23 2350 97 %     Weight 10/28/23 2351 (!) 306 lb (138.8 kg)     Height 10/28/23 2351 5\' 5"  (1.651 m)     Head Circumference --      Peak Flow --      Pain Score 10/28/23 2350 0     Pain Loc --      Pain Education --      Exclude from Growth Chart --     Most recent vital signs: Vitals:   10/28/23 2350 10/29/23 0145  BP: (!) 160/106 (!) 165/107  Pulse: (!) 102 97  Resp: 18 18  Temp: 98.3 F (36.8 C) 98.9 F (37.2 C)  SpO2: 97% 99%    General: Awake, no distress.  CV:  Good peripheral perfusion.  Resp:  Normal effort.  Abd:  No distention.  Other:  Well-appearing  no acute distress hypertensive otherwise vital signs normal.  Soft benign abdominal exam deep palpation all quadrants.  Appears euvolemic comfortable nontoxic overall.   ED Results / Procedures / Treatments   Labs (all labs ordered are listed, but only abnormal results are displayed) Labs Reviewed  CBC WITH DIFFERENTIAL/PLATELET - Abnormal; Notable for the following components:      Result Value   Neutro Abs 8.0 (*)    All other components within normal limits  COMPREHENSIVE METABOLIC PANEL WITH GFR - Abnormal; Notable for the following components:   Potassium 3.3 (*)    Glucose, Bld 186 (*)    Total Protein 8.4 (*)    AST 45 (*)    ALT 54 (*)    All other components within normal limits  URINALYSIS, ROUTINE W REFLEX MICROSCOPIC - Abnormal; Notable for the following components:   Color, Urine YELLOW (*)    APPearance HAZY (*)    Glucose, UA 50 (*)    Protein, ur 100 (*)    Leukocytes,Ua TRACE (*)    Bacteria,  UA FEW (*)    All other components within normal limits  URINE CULTURE  LIPASE, BLOOD  POC URINE PREG, ED     I ordered and reviewed the above labs they are notable for she has bacteria inflammatory changes in the urine.  Cell counts electrolytes unremarkable.  PROCEDURES:  Critical Care performed: No  Procedures   MEDICATIONS ORDERED IN ED: Medications  cephALEXin (KEFLEX) capsule 500 mg (500 mg Oral Given 10/29/23 0146)    IMPRESSION / MDM / ASSESSMENT AND PLAN / ED COURSE  I reviewed the triage vital signs and the nursing notes.                                Patient's presentation is most consistent with acute presentation with potential threat to life or bodily function.  Differential diagnosis includes, but is not limited to, urinary tract infection, dehydration electrolyte derangements, pregnancy related complications, ectopic pregnancy   The patient is on the cardiac monitor to evaluate for evidence of arrhythmia and/or significant heart rate  changes.  MDM:    She has urinary tract infection symptoms with urinary frequency and bacteria inflammatory changes in the urine.  Squamous epithelial cells as well but with symptoms matching urinary tract infection will treat with outpatient course of cephalexin.  I do not think he needs to be admitted given her overall well appearance low suspicion for sepsis.  She has a primary doctor.  I think he can follow-up with her primary doctor to continue to monitor the healing process of her knee injury, also discussed diarrheal illness, also discussed chronic dizziness/lightheadedness.       FINAL CLINICAL IMPRESSION(S) / ED DIAGNOSES   Final diagnoses:  Lower urinary tract infectious disease     Rx / DC Orders   ED Discharge Orders          Ordered    cephALEXin (KEFLEX) 500 MG capsule  4 times daily        10/29/23 0130             Note:  This document was prepared using Dragon voice recognition software and may include unintentional dictation errors.    Pilar Jarvis, MD 10/29/23 [phone]

## 2023-10-30 LAB — URINE CULTURE

## 2024-07-22 ENCOUNTER — Observation Stay
Admission: EM | Admit: 2024-07-22 | Discharge: 2024-07-23 | Disposition: A | Attending: Internal Medicine | Admitting: Internal Medicine

## 2024-07-22 ENCOUNTER — Emergency Department

## 2024-07-22 ENCOUNTER — Observation Stay: Admitting: Certified Registered"

## 2024-07-22 ENCOUNTER — Other Ambulatory Visit: Payer: Self-pay

## 2024-07-22 ENCOUNTER — Encounter: Admission: EM | Disposition: A | Payer: Self-pay | Source: Home / Self Care | Attending: Emergency Medicine

## 2024-07-22 ENCOUNTER — Observation Stay

## 2024-07-22 DIAGNOSIS — N1330 Unspecified hydronephrosis: Secondary | ICD-10-CM | POA: Diagnosis not present

## 2024-07-22 DIAGNOSIS — F419 Anxiety disorder, unspecified: Secondary | ICD-10-CM | POA: Diagnosis not present

## 2024-07-22 DIAGNOSIS — N132 Hydronephrosis with renal and ureteral calculous obstruction: Principal | ICD-10-CM | POA: Insufficient documentation

## 2024-07-22 DIAGNOSIS — Z79899 Other long term (current) drug therapy: Secondary | ICD-10-CM | POA: Diagnosis not present

## 2024-07-22 DIAGNOSIS — E282 Polycystic ovarian syndrome: Secondary | ICD-10-CM | POA: Diagnosis present

## 2024-07-22 DIAGNOSIS — F32A Depression, unspecified: Secondary | ICD-10-CM | POA: Diagnosis present

## 2024-07-22 DIAGNOSIS — I10 Essential (primary) hypertension: Secondary | ICD-10-CM | POA: Diagnosis not present

## 2024-07-22 DIAGNOSIS — F329 Major depressive disorder, single episode, unspecified: Secondary | ICD-10-CM | POA: Diagnosis present

## 2024-07-22 DIAGNOSIS — Z87891 Personal history of nicotine dependence: Secondary | ICD-10-CM | POA: Diagnosis not present

## 2024-07-22 DIAGNOSIS — E119 Type 2 diabetes mellitus without complications: Secondary | ICD-10-CM | POA: Insufficient documentation

## 2024-07-22 DIAGNOSIS — Z6841 Body Mass Index (BMI) 40.0 and over, adult: Secondary | ICD-10-CM | POA: Insufficient documentation

## 2024-07-22 DIAGNOSIS — N200 Calculus of kidney: Secondary | ICD-10-CM

## 2024-07-22 DIAGNOSIS — N201 Calculus of ureter: Secondary | ICD-10-CM | POA: Diagnosis present

## 2024-07-22 HISTORY — DX: Calculus of kidney: N20.0

## 2024-07-22 HISTORY — PX: CYSTOSCOPY W/ URETERAL STENT PLACEMENT: SHX1429

## 2024-07-22 LAB — COMPREHENSIVE METABOLIC PANEL WITH GFR
ALT: 52 U/L — ABNORMAL HIGH (ref 0–44)
AST: 32 U/L (ref 15–41)
Albumin: 4.4 g/dL (ref 3.5–5.0)
Alkaline Phosphatase: 81 U/L (ref 38–126)
Anion gap: 10 (ref 5–15)
BUN: 17 mg/dL (ref 6–20)
CO2: 23 mmol/L (ref 22–32)
Calcium: 9.5 mg/dL (ref 8.9–10.3)
Chloride: 105 mmol/L (ref 98–111)
Creatinine, Ser: 0.74 mg/dL (ref 0.44–1.00)
GFR, Estimated: >60 mL/min
Glucose, Bld: 95 mg/dL (ref 70–99)
Potassium: 3.6 mmol/L (ref 3.5–5.1)
Sodium: 139 mmol/L (ref 135–145)
Total Bilirubin: 0.5 mg/dL (ref 0.0–1.2)
Total Protein: 8 g/dL (ref 6.5–8.1)

## 2024-07-22 LAB — URINALYSIS, ROUTINE W REFLEX MICROSCOPIC
Bilirubin Urine: NEGATIVE
Glucose, UA: NEGATIVE mg/dL
Hgb urine dipstick: NEGATIVE
Ketones, ur: NEGATIVE mg/dL
Nitrite: NEGATIVE
Protein, ur: NEGATIVE mg/dL
Specific Gravity, Urine: 1.029 (ref 1.005–1.030)
pH: 5 (ref 5.0–8.0)

## 2024-07-22 LAB — LIPASE, BLOOD: Lipase: 37 U/L (ref 11–51)

## 2024-07-22 LAB — HEMOGLOBIN A1C
Hgb A1c MFr Bld: 5.2 % (ref 4.8–5.6)
Mean Plasma Glucose: 102.54 mg/dL

## 2024-07-22 LAB — CBC
HCT: 41.2 % (ref 36.0–46.0)
Hemoglobin: 13.3 g/dL (ref 12.0–15.0)
MCH: 27.9 pg (ref 26.0–34.0)
MCHC: 32.3 g/dL (ref 30.0–36.0)
MCV: 86.6 fL (ref 80.0–100.0)
Platelets: 326 K/uL (ref 150–400)
RBC: 4.76 MIL/uL (ref 3.87–5.11)
RDW: 12.8 % (ref 11.5–15.5)
WBC: 15.1 K/uL — ABNORMAL HIGH (ref 4.0–10.5)
nRBC: 0 % (ref 0.0–0.2)

## 2024-07-22 LAB — POC URINE PREG, ED: Preg Test, Ur: NEGATIVE

## 2024-07-22 LAB — GLUCOSE, CAPILLARY
Glucose-Capillary: 92 mg/dL (ref 70–99)
Glucose-Capillary: 104 mg/dL — ABNORMAL HIGH (ref 70–99)
Glucose-Capillary: 161 mg/dL — ABNORMAL HIGH (ref 70–99)

## 2024-07-22 SURGERY — CYSTOSCOPY, WITH RETROGRADE PYELOGRAM AND URETERAL STENT INSERTION
Anesthesia: General | Laterality: Left

## 2024-07-22 MED ORDER — EPHEDRINE 5 MG/ML INJ
INTRAVENOUS | Status: AC
  Filled 2024-07-22: qty 5

## 2024-07-22 MED ORDER — PROPOFOL 10 MG/ML IV BOLUS
INTRAVENOUS | Status: DC | PRN
  Administered 2024-07-22: 200 mg via INTRAVENOUS

## 2024-07-22 MED ORDER — PROPOFOL 10 MG/ML IV BOLUS
INTRAVENOUS | Status: AC
  Filled 2024-07-22: qty 20

## 2024-07-22 MED ORDER — SODIUM CHLORIDE 0.9 % IV SOLN
1.0000 g | INTRAVENOUS | Status: DC
  Administered 2024-07-22: 1 g via INTRAVENOUS
  Administered 2024-07-22: 2 g via INTRAVENOUS
  Administered 2024-07-23: 1 g via INTRAVENOUS
  Filled 2024-07-22 (×2): qty 10

## 2024-07-22 MED ORDER — IOHEXOL 180 MG/ML  SOLN
INTRAMUSCULAR | Status: DC | PRN
  Administered 2024-07-22: 10 mL

## 2024-07-22 MED ORDER — FLUOXETINE HCL 20 MG PO CAPS
40.0000 mg | ORAL_CAPSULE | Freq: Every day | ORAL | Status: DC
  Administered 2024-07-22: 40 mg via ORAL
  Filled 2024-07-22 (×2): qty 2

## 2024-07-22 MED ORDER — DROPERIDOL 2.5 MG/ML IJ SOLN: 0.6250 mg | Freq: Once | INTRAMUSCULAR | Status: DC | PRN

## 2024-07-22 MED ORDER — FENTANYL CITRATE (PF) 100 MCG/2ML IJ SOLN
INTRAMUSCULAR | Status: AC
  Filled 2024-07-22: qty 2

## 2024-07-22 MED ORDER — ONDANSETRON HCL 4 MG PO TABS
4.0000 mg | ORAL_TABLET | Freq: Four times a day (QID) | ORAL | Status: DC | PRN
  Administered 2024-07-23: 4 mg via ORAL
  Filled 2024-07-22: qty 1

## 2024-07-22 MED ORDER — ONDANSETRON HCL 4 MG/2ML IJ SOLN
INTRAMUSCULAR | Status: AC
  Filled 2024-07-22: qty 2

## 2024-07-22 MED ORDER — SUCCINYLCHOLINE CHLORIDE 200 MG/10ML IV SOSY
PREFILLED_SYRINGE | INTRAVENOUS | Status: DC | PRN
  Administered 2024-07-22: 120 mg via INTRAVENOUS

## 2024-07-22 MED ORDER — OXYCODONE HCL 5 MG PO TABS
5.0000 mg | ORAL_TABLET | ORAL | Status: DC | PRN
  Administered 2024-07-22 – 2024-07-23 (×2): 5 mg via ORAL
  Filled 2024-07-22 (×2): qty 1

## 2024-07-22 MED ORDER — INSULIN ASPART 100 UNIT/ML IJ SOLN: 0.0000 [IU] | Freq: Every day | INTRAMUSCULAR | Status: DC

## 2024-07-22 MED ORDER — IOHEXOL 300 MG/ML  SOLN
100.0000 mL | Freq: Once | INTRAMUSCULAR | Status: AC | PRN
  Administered 2024-07-22: 100 mL via INTRAVENOUS

## 2024-07-22 MED ORDER — DEXAMETHASONE SOD PHOSPHATE PF 10 MG/ML IJ SOLN
INTRAMUSCULAR | Status: DC | PRN
  Administered 2024-07-22: 10 mg via INTRAVENOUS

## 2024-07-22 MED ORDER — ONDANSETRON HCL 4 MG/2ML IJ SOLN
4.0000 mg | Freq: Four times a day (QID) | INTRAMUSCULAR | Status: DC | PRN
  Administered 2024-07-23: 4 mg via INTRAVENOUS
  Filled 2024-07-22: qty 2

## 2024-07-22 MED ORDER — MIDAZOLAM HCL 2 MG/2ML IJ SOLN
INTRAMUSCULAR | Status: AC
  Filled 2024-07-22: qty 2

## 2024-07-22 MED ORDER — ONDANSETRON HCL 4 MG/2ML IJ SOLN
INTRAMUSCULAR | Status: DC | PRN
  Administered 2024-07-22: 4 mg via INTRAVENOUS

## 2024-07-22 MED ORDER — FENTANYL CITRATE (PF) 100 MCG/2ML IJ SOLN
INTRAMUSCULAR | Status: DC | PRN
  Administered 2024-07-22 (×2): 50 ug via INTRAVENOUS

## 2024-07-22 MED ORDER — LIDOCAINE HCL (CARDIAC) PF 100 MG/5ML IV SOSY
PREFILLED_SYRINGE | INTRAVENOUS | Status: DC | PRN
  Administered 2024-07-22: 100 mg via INTRAVENOUS

## 2024-07-22 MED ORDER — PANTOPRAZOLE SODIUM 40 MG PO TBEC
40.0000 mg | DELAYED_RELEASE_TABLET | Freq: Two times a day (BID) | ORAL | Status: DC
  Administered 2024-07-22 – 2024-07-23 (×2): 40 mg via ORAL
  Filled 2024-07-22 (×2): qty 1

## 2024-07-22 MED ORDER — LISINOPRIL 20 MG PO TABS
20.0000 mg | ORAL_TABLET | Freq: Every day | ORAL | Status: DC
  Administered 2024-07-22: 20 mg via ORAL
  Filled 2024-07-22 (×2): qty 1

## 2024-07-22 MED ORDER — INSULIN GLARGINE 100 UNIT/ML ~~LOC~~ SOLN
5.0000 [IU] | Freq: Two times a day (BID) | SUBCUTANEOUS | Status: DC
  Administered 2024-07-22: 5 [IU] via SUBCUTANEOUS
  Filled 2024-07-22 (×2): qty 0.05

## 2024-07-22 MED ORDER — ONDANSETRON HCL 4 MG/2ML IJ SOLN
4.0000 mg | Freq: Once | INTRAMUSCULAR | Status: AC
  Administered 2024-07-22: 4 mg via INTRAVENOUS
  Filled 2024-07-22: qty 2

## 2024-07-22 MED ORDER — INSULIN ASPART 100 UNIT/ML IJ SOLN: 0.0000 [IU] | Freq: Three times a day (TID) | INTRAMUSCULAR | Status: DC

## 2024-07-22 MED ORDER — POLYETHYLENE GLYCOL 3350 17 G PO PACK: 17.0000 g | PACK | Freq: Every day | ORAL | Status: DC | PRN

## 2024-07-22 MED ORDER — INSULIN GLARGINE 100 UNIT/ML ~~LOC~~ SOLN
15.0000 [IU] | Freq: Every day | SUBCUTANEOUS | Status: DC
  Filled 2024-07-22: qty 0.15

## 2024-07-22 MED ORDER — LIDOCAINE HCL URETHRAL/MUCOSAL 2 % EX GEL
CUTANEOUS | Status: AC
  Filled 2024-07-22: qty 6

## 2024-07-22 MED ORDER — LIDOCAINE HCL (PF) 2 % IJ SOLN
INTRAMUSCULAR | Status: AC
  Filled 2024-07-22: qty 5

## 2024-07-22 MED ORDER — MORPHINE SULFATE (PF) 2 MG/ML IV SOLN
2.0000 mg | INTRAVENOUS | Status: DC | PRN
  Administered 2024-07-22 – 2024-07-23 (×3): 2 mg via INTRAVENOUS
  Filled 2024-07-22 (×3): qty 1

## 2024-07-22 MED ORDER — SODIUM CHLORIDE 0.9 % IV BOLUS
1000.0000 mL | Freq: Once | INTRAVENOUS | Status: AC
  Administered 2024-07-22: 1000 mL via INTRAVENOUS

## 2024-07-22 MED ORDER — CEFAZOLIN SODIUM-DEXTROSE 2-4 GM/100ML-% IV SOLN
INTRAVENOUS | Status: AC
  Filled 2024-07-22: qty 100

## 2024-07-22 MED ORDER — GLYCOPYRROLATE 0.2 MG/ML IJ SOLN
INTRAMUSCULAR | Status: DC | PRN
  Administered 2024-07-22: .2 mg via INTRAVENOUS

## 2024-07-22 MED ORDER — ACETAMINOPHEN 325 MG PO TABS
650.0000 mg | ORAL_TABLET | Freq: Four times a day (QID) | ORAL | Status: DC | PRN
  Administered 2024-07-22 – 2024-07-23 (×4): 650 mg via ORAL
  Filled 2024-07-22 (×4): qty 2

## 2024-07-22 MED ORDER — CEFAZOLIN SODIUM 1 G IJ SOLR
INTRAMUSCULAR | Status: AC
  Filled 2024-07-22: qty 20

## 2024-07-22 MED ORDER — GLYCOPYRROLATE 0.2 MG/ML IJ SOLN
INTRAMUSCULAR | Status: AC
  Filled 2024-07-22: qty 1

## 2024-07-22 MED ORDER — SUCCINYLCHOLINE CHLORIDE 200 MG/10ML IV SOSY
PREFILLED_SYRINGE | INTRAVENOUS | Status: AC
  Filled 2024-07-22: qty 10

## 2024-07-22 MED ORDER — ACETAMINOPHEN 650 MG RE SUPP: 650.0000 mg | Freq: Four times a day (QID) | RECTAL | Status: DC | PRN

## 2024-07-22 MED ORDER — KETOROLAC TROMETHAMINE 15 MG/ML IJ SOLN
15.0000 mg | Freq: Once | INTRAMUSCULAR | Status: AC
  Administered 2024-07-22: 15 mg via INTRAVENOUS
  Filled 2024-07-22: qty 1

## 2024-07-22 MED ORDER — KETOROLAC TROMETHAMINE 15 MG/ML IJ SOLN
15.0000 mg | Freq: Four times a day (QID) | INTRAMUSCULAR | Status: DC | PRN
  Administered 2024-07-22: 15 mg via INTRAVENOUS
  Filled 2024-07-22 (×2): qty 1

## 2024-07-22 MED ORDER — FENTANYL CITRATE (PF) 100 MCG/2ML IJ SOLN: 25.0000 ug | INTRAMUSCULAR | Status: DC | PRN

## 2024-07-22 MED ORDER — MIDAZOLAM HCL (PF) 2 MG/2ML IJ SOLN
INTRAMUSCULAR | Status: DC | PRN
  Administered 2024-07-22: 2 mg via INTRAVENOUS

## 2024-07-22 MED ORDER — SODIUM CHLORIDE 0.9 % IV SOLN: INTRAVENOUS | Status: DC | PRN

## 2024-07-22 MED ORDER — SODIUM CHLORIDE 0.9 % IR SOLN
Status: DC | PRN
  Administered 2024-07-22: 3000 mL via INTRAVESICAL

## 2024-07-22 SURGICAL SUPPLY — 15 items
BRUSH SCRUB EZ 4% CHG (MISCELLANEOUS) ×1 IMPLANT
CATH URETL OPEN END 6X70 (CATHETERS) ×1 IMPLANT
GLOVE BIOGEL PI IND STRL 7.5 (GLOVE) ×1 IMPLANT
GOWN STRL REUS W/ TWL LRG LVL3 (GOWN DISPOSABLE) ×1 IMPLANT
GOWN STRL REUS W/ TWL XL LVL3 (GOWN DISPOSABLE) ×1 IMPLANT
GUIDEWIRE STR DUAL SENSOR (WIRE) ×1 IMPLANT
KIT TURNOVER CYSTO (KITS) ×1 IMPLANT
PACK CYSTO AR (MISCELLANEOUS) ×1 IMPLANT
SET CYSTO IRRIGATION (SET/KITS/TRAYS/PACK) ×1 IMPLANT
SOL .9 NS 3000ML IRR UROMATIC (IV SOLUTION) ×1 IMPLANT
SOLN STERILE WATER 500 ML (IV SOLUTION) ×1 IMPLANT
STENT URET 6FRX24 CONTOUR (STENTS) IMPLANT
STENT URET 6FRX26 CONTOUR (STENTS) IMPLANT
SURGILUBE 2OZ TUBE FLIPTOP (MISCELLANEOUS) ×1 IMPLANT
SYR 10ML LL (SYRINGE) ×1 IMPLANT

## 2024-07-22 NOTE — Anesthesia Preprocedure Evaluation (Signed)
"                                    Anesthesia Evaluation  Patient identified by MRN, date of birth, ID band Patient awake    Reviewed: Allergy & Precautions, H&P , NPO status , Patient's Chart, lab work & pertinent test results, reviewed documented beta blocker date and time   History of Anesthesia Complications (+) PROLONGED EMERGENCE and history of anesthetic complications  Airway Mallampati: II  TM Distance: >3 FB Neck ROM: full    Dental  (+) Teeth Intact, Dental Advidsory Given, Missing, Chipped   Pulmonary neg pulmonary ROS, Patient abstained from smoking., former smoker   Pulmonary exam normal breath sounds clear to auscultation       Cardiovascular Exercise Tolerance: Good hypertension, On Medications (-) angina (-) Past MI and (-) Cardiac Stents (-) dysrhythmias (-) Valvular Problems/Murmurs Rhythm:regular Rate:Normal     Neuro/Psych Seizures -, Well Controlled,  PSYCHIATRIC DISORDERS Anxiety Depression       GI/Hepatic Neg liver ROS,GERD  ,,  Endo/Other  diabetes, Well Controlled, Type 2  Class 3 obesity  Renal/GU Renal disease (kidney stone)     Musculoskeletal   Abdominal   Peds  Hematology negative hematology ROS (+)   Anesthesia Other Findings Past Medical History: No date: Anxiety No date: Depression No date: History of migraine headaches No date: Hypertension No date: Morbid obesity (HCC) No date: Seizures (HCC)     Comment:  Febrile seizures as a child and undiagnosed seizure on               01/2015   Reproductive/Obstetrics                              Anesthesia Physical Anesthesia Plan  ASA: 3 and emergent  Anesthesia Plan: General   Post-op Pain Management:    Induction: Intravenous, Rapid sequence and Cricoid pressure planned  PONV Risk Score and Plan: 3 and Ondansetron , Dexamethasone , Midazolam  and Treatment may vary due to age or medical condition  Airway Management Planned: Oral  ETT  Additional Equipment:   Intra-op Plan:   Post-operative Plan: Extubation in OR  Informed Consent: I have reviewed the patients History and Physical, chart, labs and discussed the procedure including the risks, benefits and alternatives for the proposed anesthesia with the patient or authorized representative who has indicated his/her understanding and acceptance.       Plan Discussed with:   Anesthesia Plan Comments:          Anesthesia Quick Evaluation  "

## 2024-07-22 NOTE — ED Notes (Signed)
 PARAMEDIC BRANDY INFORMED OF BED ASSIGNED

## 2024-07-22 NOTE — H&P (Signed)
 "  History and Physical    Lori Henry FMW:[id number] DOB: 1995-09-15 DOA: 07/22/2024  DOS: the patient was seen and examined on 07/22/2024  PCP: System, Provider Not In   Patient coming from: Home  I have personally briefly reviewed patient's old medical records in [facility]  Chief Complaint: Abdominal pain for 4 days  HPI: Lori Henry is a pleasant 28 y.o. female with medical history significant for DM on Mounjaro, HTN, morbid obesity, PCOS, PTSD, anxiety on fluoxetine  who came into ED complaining of abdominal pain for 4 days.  She had a history of kidney stone and passed without any treatment.  She stated that her abdominal pain was crampy, severe, 7/18 intensity, sharp, left lumbar area radiating down, associate with some diarrhea.  Patient denies any fever, chills, dysuria, hematuria, vaginal discharge or bleeding.  ED Course: Upon arrival to the ED, patient is found to be hypertensive at 150/106, white count 15.1, urine analysis was positive for UTI, CT scan of the abdomen and pelvis showed bilateral nephrolithiasis, multiple stacked stone in the left ureter with hydronephrosis.  Patient was given ceftriaxone , urology was consulted and hospitalist service was consulted for evaluation for admission.  Review of Systems:  ROS  All other systems negative except as noted in the HPI.  Past Medical History:  Diagnosis Date   Anxiety    Depression    History of migraine headaches    Hypertension    Morbid obesity (HCC)    Seizures (HCC)    Febrile seizures as a child and undiagnosed seizure on 01/2015    Past Surgical History:  Procedure Laterality Date   CESAREAN SECTION  12/11/2021   Procedure: CESAREAN SECTION;  Surgeon: Connell Davies, MD;  Location: [facility] ORS;  Service: Obstetrics;;   CHOLECYSTECTOMY N/A 10/23/2016   Procedure: LAPAROSCOPIC CHOLECYSTECTOMY WITH INTRAOPERATIVE CHOLANGIOGRAM;  Surgeon: Larinda Unknown Sharps, MD;  Location: [facility] ORS;  Service:  General;  Laterality: N/A;   MOUTH BIOPSY     benign   MOUTH SURGERY     WISDOM TOOTH EXTRACTION       reports that she quit smoking about 3 years ago. Her smoking use included cigarettes. She started smoking about 8 years ago. She has a 1.3 pack-year smoking history. She has never used smokeless tobacco. She reports that she does not drink alcohol and does not use drugs.  Allergies[1]  Family History  Problem Relation Age of Onset   Hyperlipidemia Mother    Diabetes Mother    Hypertension Mother    Diabetes Father     Prior to Admission medications  Medication Sig Start Date End Date Taking? Authorizing Provider  lidocaine  (XYLOCAINE ) 2 % solution Use as directed 15 mLs in the mouth or throat every 6 (six) hours as needed for mouth pain. 06/20/23   Beola Terrall RAMAN, PA-C  norethindrone  (ORTHO MICRONOR ) 0.35 MG tablet Take 1 tablet (0.35 mg total) by mouth daily. 09/28/22   Veda Hummingbird, FNP  Prenatal Vit-Fe Fumarate-FA (PRENATAL MULTIVITAMIN) TABS tablet Take 1 tablet by mouth daily at 12 noon. 12/14/21   Delinda Jinnie Jansky, CNM    Physical Exam: Vitals:   07/22/24 0542 07/22/24 0910 07/22/24 1011 07/22/24 1155  BP:    138/86  Pulse:      Resp:    16  Temp:   98.1 F (36.7 C) 97.9 F (36.6 C)  TempSrc:   Oral Oral  SpO2:  99%  97%  Weight: 115.7 kg  Height: 5' 3 (1.6 m)       Physical Exam   Constitutional: Alert, awake, calm, comfortable HEENT: Neck supple Respiratory: Clear to auscultation B/L, no wheezing, no rales.  Cardiovascular: Regular rate and rhythm, no murmurs / rubs / gallops. No extremity edema. 2+ pedal pulses. No carotid bruits.  Abdomen: Soft, no tenderness, Bowel sounds positive.  Morbidly obese abdomen Musculoskeletal: no clubbing / cyanosis. Good ROM, no contractures. Normal muscle tone.  Skin: no rashes, lesions, ulcers. Neurologic: CN 2-12 grossly intact. Sensation intact, No focal deficit identified Psychiatric: Alert and oriented x 3.  Normal mood.    Labs on Admission: I have personally reviewed following labs and imaging studies  CBC: Recent Labs  Lab 07/22/24 0545  WBC 15.1*  HGB 13.3  HCT 41.2  MCV 86.6  PLT 326   Basic Metabolic Panel: Recent Labs  Lab 07/22/24 0545  NA 139  K 3.6  CL 105  CO2 23  GLUCOSE 95  BUN 17  CREATININE 0.74  CALCIUM 9.5   GFR: Estimated Creatinine Clearance: 128.4 mL/min (by C-G formula based on SCr of 0.74 mg/dL). Liver Function Tests: Recent Labs  Lab 07/22/24 0545  AST 32  ALT 52*  ALKPHOS 81  BILITOT 0.5  PROT 8.0  ALBUMIN 4.4   Recent Labs  Lab 07/22/24 0545  LIPASE 37   No results for input(s): AMMONIA  in the last 168 hours. Coagulation Profile: No results for input(s): INR, PROTIME in the last 168 hours. Cardiac Enzymes: No results for input(s): CKTOTAL, CKMB, CKMBINDEX, TROPONINI, TROPONINIHS in the last 168 hours. BNP (last 3 results) No results for input(s): BNP in the last 8760 hours. HbA1C: No results for input(s): HGBA1C in the last 72 hours. CBG: No results for input(s): GLUCAP in the last 168 hours. Lipid Profile: No results for input(s): CHOL, HDL, LDLCALC, TRIG, CHOLHDL, LDLDIRECT in the last 72 hours. Thyroid Function Tests: No results for input(s): TSH, T4TOTAL, FREET4, T3FREE, THYROIDAB in the last 72 hours. Anemia Panel: No results for input(s): VITAMINB12, FOLATE, FERRITIN, TIBC, IRON, RETICCTPCT in the last 72 hours. Urine analysis:    Component Value Date/Time   COLORURINE YELLOW (A) 07/22/2024 0545   APPEARANCEUR HAZY (A) 07/22/2024 0545   APPEARANCEUR Hazy 02/04/2012 2337   LABSPEC 1.029 07/22/2024 0545   LABSPEC 1.009 02/04/2012 2337   PHURINE 5.0 07/22/2024 0545   GLUCOSEU NEGATIVE 07/22/2024 0545   GLUCOSEU Negative 02/04/2012 2337   HGBUR NEGATIVE 07/22/2024 0545   BILIRUBINUR NEGATIVE 07/22/2024 0545   BILIRUBINUR negative 12/09/2021 1627   BILIRUBINUR  Negative 02/04/2012 2337   KETONESUR NEGATIVE 07/22/2024 0545   PROTEINUR NEGATIVE 07/22/2024 0545   UROBILINOGEN negative (A) 12/09/2021 1627   NITRITE NEGATIVE 07/22/2024 0545   LEUKOCYTESUR SMALL (A) 07/22/2024 0545   LEUKOCYTESUR Negative 02/04/2012 2337    Radiological Exams on Admission: I have personally reviewed images DG OR UROLOGY CYSTO IMAGE ([facility] ONLY) Result Date: 07/22/2024 There is no interpretation for this exam.  This order is for images obtained during a surgical procedure.  Please See Surgeries Tab for more information regarding the procedure.   CT ABDOMEN PELVIS W CONTRAST Result Date: 07/22/2024 EXAM: CT ABDOMEN AND PELVIS WITH CONTRAST 07/22/2024 09:29:56 AM TECHNIQUE: CT of the abdomen and pelvis was performed with the administration of 100 mL of iohexol  (OMNIPAQUE ) 300 MG/ML solution. Multiplanar reformatted images are provided for review. Automated exposure control, iterative reconstruction, and/or weight-based adjustment of the mA/kV was utilized to reduce the radiation dose to as low  as reasonably achievable. COMPARISON: 01/29/2019 CLINICAL HISTORY: LLQ abdominal pain; diarrhea. FINDINGS: LOWER CHEST: No acute abnormality. LIVER: Hepatic steatosis. GALLBLADDER AND BILE DUCTS: Status post cholecystectomy. No biliary ductal dilatation. SPLEEN: No acute abnormality. PANCREAS: No acute abnormality. ADRENAL GLANDS: No acute abnormality. KIDNEYS, URETERS AND BLADDER: Bilateral nephrolithiasis. The largest stone is in the upper pole collecting system of the left kidney measuring 1.2 cm (image 39/2). Asymmetric perinephric fat stranding, edema, and hydronephrosis are noted involving the left kidney. Calcification within the central renal pelvis measures 3 mm. There are at least 3 stacked stones within the proximal left ureter, the largest measures 7 mm (coronal image 63/5). No perinephric or periureteral stranding is noted involving the right kidney or ureter. Urinary bladder is  unremarkable. GI AND BOWEL: Stomach demonstrates no acute abnormality. The appendix is visualized and appears normal. There is no bowel obstruction. PERITONEUM AND RETROPERITONEUM: No ascites. No free air. VASCULATURE: Aorta is normal in caliber. LYMPH NODES: No lymphadenopathy. REPRODUCTIVE ORGANS: The uterus appears normal. No adnexal mass. BONES AND SOFT TISSUES: No acute or suspicious osseous findings. No focal soft tissue abnormality. IMPRESSION: 1. At least 3 stacked stones within the proximal left ureter, the largest measuring 7 mm, with associated left hydronephrosis and perinephric fat stranding/edema. 2. Bilateral nephrolithiasis with the largest stone in the upper pole collecting system of the left kidney measuring 1.2 cm. Electronically signed by: Waddell Calk MD 07/22/2024 09:37 AM EST RP Workstation: [id number]    EKG: N/A    Assessment/Plan Principal Problem:   Hydronephrosis, left Active Problems:   MDD (major depressive disorder)   Essential hypertension   Morbid obesity with BMI of 45.0-49.9, adult (HCC)   PCOS (polycystic ovarian syndrome)   Ureteric stone    Assessment and Plan: 28 year old morbidly obese female with history of HTN, DM, anxiety/PTSD, with history of previous kidney stone came in for left flank pain with bilateral kidney stone and left hydronephrosis.  1.  Left hydronephrosis with multiple stones - She will be placed in observation - She was started on IV antibiotics ceftriaxone  - Urologist was consulted they will take her to the OR for possible stent placement and stone removal - Pain medications, IV fluid, n.p.o. and continue ceftriaxone   2.  Anxiety/PTSD - Continue on home dose of Wellbutrin  3.  Type 2 diabetes - Patient uses Mounjaro at home. - Will give patient Lantus  and sliding scale.  4.  HTN - Will continue her home dose of lisinopril  - Continue monitor blood pressure      DVT prophylaxis: SCDs Code Status: Full Code Family  Communication: None Disposition Plan: Home Consults called: Urology Admission status: Observation, Med-Surg   Levone Otten, MD [facility] 07/22/2024, 1:10 PM        [1]  Allergies Allergen Reactions   Bee Venom Anaphylaxis and Itching    Other Reaction(s): eye redness, facial swelling   "

## 2024-07-22 NOTE — Consult Note (Signed)
 Urology Consult    Reason for consult: Left obstructing ureteral stones with sign of infection  History of Present Illness: Lori Henry is a 28 y.o. with no prior history of urolithiasis however past medical history of morbid obesity, recurrent UTIs, PCOS, hypertension, PTSD, depression, panic attacks who presented to the ED today with complaints of left-sided abdominal pain.  She was also had nausea and emesis.  She denies any dysuria.  She was found to have leukocytosis of 15. Urinalysis revealed many bacteria.  CT A/P 07/22/2024 with 3 stacked stones in the proximal left ureter largest measuring 7 mm associate with left hydronephrosis and fat stranding.  She also has bilateral renal stones however only punctate on the right and up to 1.2 cm on the left.  She denies a history of voiding or storage urinary symptoms, hematuria, STDs, urolithiasis, GU malignancy/trauma/surgery.  Past Medical History:  Diagnosis Date   Anxiety    Depression    History of migraine headaches    Hypertension    Morbid obesity (HCC)    Seizures (HCC)    Febrile seizures as a child and undiagnosed seizure on 01/2015    Past Surgical History:  Procedure Laterality Date   CESAREAN SECTION  12/11/2021   Procedure: CESAREAN SECTION;  Surgeon: Connell Davies, MD;  Location: [facility] ORS;  Service: Obstetrics;;   CHOLECYSTECTOMY N/A 10/23/2016   Procedure: LAPAROSCOPIC CHOLECYSTECTOMY WITH INTRAOPERATIVE CHOLANGIOGRAM;  Surgeon: Larinda Unknown Sharps, MD;  Location: [facility] ORS;  Service: General;  Laterality: N/A;   MOUTH BIOPSY     benign   MOUTH SURGERY     WISDOM TOOTH EXTRACTION       Current Hospital Medications:  Home meds: Medications Ordered Prior to Encounter[1]   Scheduled Meds: Continuous Infusions:  cefTRIAXone  (ROCEPHIN )  IV Stopped (07/22/24 1054)   PRN Meds:.acetaminophen  **OR** acetaminophen , ketorolac , morphine  injection, ondansetron  **OR** ondansetron  (ZOFRAN ) IV, oxyCODONE , polyethylene  glycol  Allergies: Allergies[2]  Family History  Problem Relation Age of Onset   Hyperlipidemia Mother    Diabetes Mother    Hypertension Mother    Diabetes Father     Social History:  reports that she quit smoking about 3 years ago. Her smoking use included cigarettes. She started smoking about 8 years ago. She has a 1.3 pack-year smoking history. She has never used smokeless tobacco. She reports that she does not drink alcohol and does not use drugs.  ROS: A complete review of systems was performed.  All systems are negative except for pertinent findings as noted.  Physical Exam:  Vital signs in last 24 hours: Temp:  [97.9 F (36.6 C)-98.1 F (36.7 C)] 97.9 F (36.6 C) (12/20 1155) Pulse Rate:  [83] 83 (12/20 0541) Resp:  [16-18] 16 (12/20 1155) BP: (138-150)/(86-106) 138/86 (12/20 1155) SpO2:  [97 %-99 %] 97 % (12/20 1155) Weight:  [115.7 kg] 115.7 kg (12/20 0542) Constitutional:  Alert and oriented, No acute distress Cardiovascular: Regular rate and rhythm Respiratory: Normal respiratory effort, Lungs clear bilaterally GI: Abdomen is soft, nontender, nondistended, no abdominal masses GU: No CVA tenderness Neurologic: Grossly intact, no focal deficits Psychiatric: Normal mood and affect  Laboratory Data:  Recent Labs    07/22/24 0545  WBC 15.1*  HGB 13.3  HCT 41.2  PLT 326    Recent Labs    07/22/24 0545  NA 139  K 3.6  CL 105  GLUCOSE 95  BUN 17  CALCIUM 9.5  CREATININE 0.74     Results for orders placed or  performed during the hospital encounter of 07/22/24 (from the past 24 hours)  Lipase, blood     Status: None   Collection Time: 07/22/24  5:45 AM  Result Value Ref Range   Lipase 37 11 - 51 U/L  Comprehensive metabolic panel     Status: Abnormal   Collection Time: 07/22/24  5:45 AM  Result Value Ref Range   Sodium 139 135 - 145 mmol/L   Potassium 3.6 3.5 - 5.1 mmol/L   Chloride 105 98 - 111 mmol/L   CO2 23 22 - 32 mmol/L   Glucose, Bld 95  70 - 99 mg/dL   BUN 17 6 - 20 mg/dL   Creatinine, Ser 9.25 0.44 - 1.00 mg/dL   Calcium 9.5 8.9 - 89.6 mg/dL   Total Protein 8.0 6.5 - 8.1 g/dL   Albumin 4.4 3.5 - 5.0 g/dL   AST 32 15 - 41 U/L   ALT 52 (H) 0 - 44 U/L   Alkaline Phosphatase 81 38 - 126 U/L   Total Bilirubin 0.5 0.0 - 1.2 mg/dL   GFR, Estimated >39 >39 mL/min   Anion gap 10 5 - 15  CBC     Status: Abnormal   Collection Time: 07/22/24  5:45 AM  Result Value Ref Range   WBC 15.1 (H) 4.0 - 10.5 K/uL   RBC 4.76 3.87 - 5.11 MIL/uL   Hemoglobin 13.3 12.0 - 15.0 g/dL   HCT 58.7 63.9 - 53.9 %   MCV 86.6 80.0 - 100.0 fL   MCH 27.9 26.0 - 34.0 pg   MCHC 32.3 30.0 - 36.0 g/dL   RDW 87.1 88.4 - 84.4 %   Platelets 326 150 - 400 K/uL   nRBC 0.0 0.0 - 0.2 %  Urinalysis, Routine w reflex microscopic -Urine, Clean Catch     Status: Abnormal   Collection Time: 07/22/24  5:45 AM  Result Value Ref Range   Color, Urine YELLOW (A) YELLOW   APPearance HAZY (A) CLEAR   Specific Gravity, Urine 1.029 1.005 - 1.030   pH 5.0 5.0 - 8.0   Glucose, UA NEGATIVE NEGATIVE mg/dL   Hgb urine dipstick NEGATIVE NEGATIVE   Bilirubin Urine NEGATIVE NEGATIVE   Ketones, ur NEGATIVE NEGATIVE mg/dL   Protein, ur NEGATIVE NEGATIVE mg/dL   Nitrite NEGATIVE NEGATIVE   Leukocytes,Ua SMALL (A) NEGATIVE   RBC / HPF 0-5 0 - 5 RBC/hpf   WBC, UA 6-10 0 - 5 WBC/hpf   Bacteria, UA MANY (A) NONE SEEN   Squamous Epithelial / HPF 11-20 0 - 5 /HPF   Mucus PRESENT   POC urine preg, ED     Status: None   Collection Time: 07/22/24  5:56 AM  Result Value Ref Range   Preg Test, Ur Negative Negative   No results found for this or any previous visit (from the past 240 hours).  Renal Function: Recent Labs    07/22/24 0545  CREATININE 0.74   Estimated Creatinine Clearance: 128.4 mL/min (by C-G formula based on SCr of 0.74 mg/dL).  Radiologic Imaging: CT ABDOMEN PELVIS W CONTRAST Result Date: 07/22/2024 EXAM: CT ABDOMEN AND PELVIS WITH CONTRAST 07/22/2024  09:29:56 AM TECHNIQUE: CT of the abdomen and pelvis was performed with the administration of 100 mL of iohexol  (OMNIPAQUE ) 300 MG/ML solution. Multiplanar reformatted images are provided for review. Automated exposure control, iterative reconstruction, and/or weight-based adjustment of the mA/kV was utilized to reduce the radiation dose to as low as reasonably achievable. COMPARISON: 01/29/2019 CLINICAL HISTORY:  LLQ abdominal pain; diarrhea. FINDINGS: LOWER CHEST: No acute abnormality. LIVER: Hepatic steatosis. GALLBLADDER AND BILE DUCTS: Status post cholecystectomy. No biliary ductal dilatation. SPLEEN: No acute abnormality. PANCREAS: No acute abnormality. ADRENAL GLANDS: No acute abnormality. KIDNEYS, URETERS AND BLADDER: Bilateral nephrolithiasis. The largest stone is in the upper pole collecting system of the left kidney measuring 1.2 cm (image 39/2). Asymmetric perinephric fat stranding, edema, and hydronephrosis are noted involving the left kidney. Calcification within the central renal pelvis measures 3 mm. There are at least 3 stacked stones within the proximal left ureter, the largest measures 7 mm (coronal image 63/5). No perinephric or periureteral stranding is noted involving the right kidney or ureter. Urinary bladder is unremarkable. GI AND BOWEL: Stomach demonstrates no acute abnormality. The appendix is visualized and appears normal. There is no bowel obstruction. PERITONEUM AND RETROPERITONEUM: No ascites. No free air. VASCULATURE: Aorta is normal in caliber. LYMPH NODES: No lymphadenopathy. REPRODUCTIVE ORGANS: The uterus appears normal. No adnexal mass. BONES AND SOFT TISSUES: No acute or suspicious osseous findings. No focal soft tissue abnormality. IMPRESSION: 1. At least 3 stacked stones within the proximal left ureter, the largest measuring 7 mm, with associated left hydronephrosis and perinephric fat stranding/edema. 2. Bilateral nephrolithiasis with the largest stone in the upper pole  collecting system of the left kidney measuring 1.2 cm. Electronically signed by: Waddell Calk MD 07/22/2024 09:37 AM EST RP Workstation: [id number]    I independently reviewed the above imaging studies.  Impression/Recommendation: Left ureteral stones with sign infection  - Reviewed case with CT scan with left hydronephrosis due to large 7 mm left ureteral stone as well as large left renal stones present.  She does have leukocytosis.  Given concern for infection, we will take to the operating today for left stent placement. -Will arrange follow-up with [facility] for definitive treatment of her stone. - Will require at least 1 week course of antibiotics. -The risks, benefits and alternatives of cystoscopy with left JJ stent placement was discussed with the patient.  Risks include, but are not limited to: bleeding, urinary tract infection, ureteral injury, ureteral stricture disease, chronic pain, urinary symptoms, bladder injury, stent migration, the need for nephrostomy tube placement, MI, CVA, DVT, PE and the inherent risks with general anesthesia.  The patient voices understanding and wishes to proceed.   Matt R. Magdelyn Roebuck MD 07/22/2024, 12:49 PM  [facility]  Pager: [phone]      [1]  No current facility-administered medications on file prior to encounter.   Current Outpatient Medications on File Prior to Encounter  Medication Sig Dispense Refill   FLUoxetine  (PROZAC ) 40 MG capsule Take 40 mg by mouth daily.     lisinopril  (ZESTRIL ) 20 MG tablet Take 20 mg by mouth daily.     metFORMIN  (GLUCOPHAGE -XR) 500 MG 24 hr tablet Take 1,000 mg by mouth daily with breakfast.     MOUNJARO 15 MG/0.5ML Pen Inject 15 mg into the skin once a week.     omeprazole  (PRILOSEC ) 20 MG capsule Take 20 mg by mouth daily.     norethindrone  (ORTHO MICRONOR ) 0.35 MG tablet Take 1 tablet (0.35 mg total) by mouth daily. 28 tablet 12   Prenatal Vit-Fe Fumarate-FA (PRENATAL MULTIVITAMIN)  TABS tablet Take 1 tablet by mouth daily at 12 noon. 30 tablet 4  [2]  Allergies Allergen Reactions   Bee Venom Anaphylaxis and Itching    Other Reaction(s): eye redness, facial swelling

## 2024-07-22 NOTE — Anesthesia Procedure Notes (Signed)
 Procedure Name: Intubation Date/Time: 07/22/2024 1:29 PM  Performed by: Leontine Katz, CRNAPre-anesthesia Checklist: Patient identified, Patient being monitored, Timeout performed, Emergency Drugs available and Suction available Patient Re-evaluated:Patient Re-evaluated prior to induction Oxygen Delivery Method: Circle system utilized Preoxygenation: Pre-oxygenation with 100% oxygen Induction Type: IV induction and Rapid sequence Laryngoscope Size: 3 and McGrath Grade View: Grade I Tube type: Oral Tube size: 7.0 mm Number of attempts: 1 Airway Equipment and Method: Stylet and Video-laryngoscopy Placement Confirmation: ETT inserted through vocal cords under direct vision, positive ETCO2 and breath sounds checked- equal and bilateral Secured at: 21 cm Tube secured with: Tape Dental Injury: Teeth and Oropharynx as per pre-operative assessment

## 2024-07-22 NOTE — ED Triage Notes (Signed)
 Pt presents for lower abdominal pain with diarrhea x4 days. Initially had nausea and vomiting which have since resolved. Denies known fevers. Pt had 1 episode of epistaxis out of right nostril. Cough x1 day with productive green sputum.

## 2024-07-22 NOTE — Transfer of Care (Signed)
 Immediate Anesthesia Transfer of Care Note  Patient: Lori Henry  Procedure(s) Performed: CYSTOSCOPY, WITH RETROGRADE PYELOGRAM AND URETERAL STENT INSERTION (Left)  Patient Location: PACU  Anesthesia Type:General  Level of Consciousness: awake, alert , and oriented  Airway & Oxygen Therapy: Patient Spontanous Breathing and Patient connected to face mask oxygen  Post-op Assessment: Report given to RN and Post -op Vital signs reviewed and stable  Post vital signs: Reviewed  Last Vitals:  Vitals Value Taken Time  BP 112/72 07/22/24 13:50  Temp    Pulse 105 07/22/24 13:51  Resp 18 07/22/24 13:51  SpO2 100 % 07/22/24 13:51  Vitals shown include unfiled device data.  Last Pain:         Complications: No notable events documented.

## 2024-07-22 NOTE — ED Provider Notes (Signed)
 "  [facility] Provider Note    Event Date/Time   First MD Initiated Contact with Patient 07/22/24 0830     (approximate)   History   Abdominal Pain   HPI  Lori Henry is a 28 y.o. female with a past medical history of morbid obesity, recurrent UTIs, PCOS, hypertension, PTSD, depression, panic attacks who presents today for evaluation of abdominal cramping and diarrhea for the past 4 days.  She also reports nausea and had vomiting for the first couple days but this has resolved.  She denies any burning with urination or vaginal discharge or bleeding.  No fevers or chills.  No flank pain.  Patient Active Problem List   Diagnosis Date Noted   Ureteric stone 07/22/2024   Hydronephrosis, left 07/22/2024   History of gestational diabetes mellitus (GDM) 09/28/2022   Morbid obesity with BMI of 45.0-49.9, adult (HCC)    History of kidney stones 09/27/2021   History of recurrent UTIs 09/27/2021   PCOS (polycystic ovarian syndrome) 08/21/2020   Essential hypertension 08/15/2019   PTSD (post-traumatic stress disorder) 08/15/2019   MDD (major depressive disorder) 08/14/2019   Panic attack    Hx of seizure disorder 06/06/2013          Physical Exam   Triage Vital Signs: ED Triage Vitals  Encounter Vitals Group     BP 07/22/24 0541 (!) 150/106     Girls Systolic BP Percentile --      Girls Diastolic BP Percentile --      Boys Systolic BP Percentile --      Boys Diastolic BP Percentile --      Pulse Rate 07/22/24 0541 83     Resp 07/22/24 0541 18     Temp 07/22/24 0541 97.9 F (36.6 C)     Temp Source 07/22/24 0541 Oral     SpO2 07/22/24 0541 99 %     Weight 07/22/24 0542 255 lb (115.7 kg)     Height 07/22/24 0542 5' 3 (1.6 m)     Head Circumference --      Peak Flow --      Pain Score 07/22/24 0541 8     Pain Loc --      Pain Education --      Exclude from Growth Chart --     Most recent vital signs: Vitals:   07/22/24 1011 07/22/24  1155  BP:  138/86  Pulse:    Resp:  16  Temp: 98.1 F (36.7 C) 97.9 F (36.6 C)  SpO2:  97%    Physical Exam Vitals and nursing note reviewed.  Constitutional:      General: Awake and alert. No acute distress.    Appearance: Normal appearance.  HENT:     Head: Normocephalic and atraumatic.     Mouth: Mucous membranes are moist.  Eyes:     General: PERRL. Normal EOMs        Right eye: No discharge.        Left eye: No discharge.     Conjunctiva/sclera: Conjunctivae normal.  Cardiovascular:     Rate and Rhythm: Normal rate and regular rhythm.     Pulses: Normal pulses.     Heart sounds: Normal heart sounds Pulmonary:     Effort: Pulmonary effort is normal. No respiratory distress.     Breath sounds: Normal breath sounds.  Abdominal:     Abdomen is soft. There is left sided abdominal tenderness. No rebound or guarding. No  distention.  No CVAT Musculoskeletal:        General: No swelling. Normal range of motion.     Cervical back: Normal range of motion and neck supple.  Skin:    General: Skin is warm and dry.     Capillary Refill: Capillary refill takes less than 2 seconds.     Findings: No rash.  Neurological:     Mental Status: The patient is awake and alert.      ED Results / Procedures / Treatments   Labs (all labs ordered are listed, but only abnormal results are displayed) Labs Reviewed  COMPREHENSIVE METABOLIC PANEL WITH GFR - Abnormal; Notable for the following components:      Result Value   ALT 52 (*)    All other components within normal limits  CBC - Abnormal; Notable for the following components:   WBC 15.1 (*)    All other components within normal limits  URINALYSIS, ROUTINE W REFLEX MICROSCOPIC - Abnormal; Notable for the following components:   Color, Urine YELLOW (*)    APPearance HAZY (*)    Leukocytes,Ua SMALL (*)    Bacteria, UA MANY (*)    All other components within normal limits  URINE CULTURE  LIPASE, BLOOD  HIV ANTIBODY (ROUTINE  TESTING W REFLEX)  HEMOGLOBIN A1C  POC URINE PREG, ED     EKG     RADIOLOGY I independently reviewed and interpreted imaging and agree with radiologists findings.     PROCEDURES:  Critical Care performed:   Procedures   MEDICATIONS ORDERED IN ED: Medications  cefTRIAXone  (ROCEPHIN ) 1 g in sodium chloride  0.9 % 100 mL IVPB ( Intravenous Automatically Held 07/30/24 1000)  acetaminophen  (TYLENOL ) tablet 650 mg ( Oral MAR Hold 07/22/24 1317)    Or  acetaminophen  (TYLENOL ) suppository 650 mg ( Rectal MAR Hold 07/22/24 1317)  oxyCODONE  (Oxy IR/ROXICODONE ) immediate release tablet 5 mg ( Oral MAR Hold 07/22/24 1317)  ketorolac  (TORADOL ) 15 MG/ML injection 15 mg ( Intravenous MAR Hold 07/22/24 1317)  polyethylene glycol (MIRALAX  / GLYCOLAX ) packet 17 g ( Oral MAR Hold 07/22/24 1317)  ondansetron  (ZOFRAN ) tablet 4 mg ( Oral MAR Hold 07/22/24 1317)    Or  ondansetron  (ZOFRAN ) injection 4 mg ( Intravenous MAR Hold 07/22/24 1317)  morphine  (PF) 2 MG/ML injection 2 mg ( Intravenous MAR Hold 07/22/24 1317)  FLUoxetine  (PROZAC ) capsule 40 mg ( Oral Automatically Held 07/30/24 1000)  lisinopril  (ZESTRIL ) tablet 20 mg ( Oral Automatically Held 07/30/24 1000)  insulin  aspart (novoLOG ) injection 0-9 Units ( Subcutaneous Automatically Held 07/30/24 1700)  insulin  aspart (novoLOG ) injection 0-5 Units ( Subcutaneous Automatically Held 07/30/24 2200)  insulin  glargine (LANTUS ) injection 15 Units ( Subcutaneous Automatically Held 07/30/24 2200)  pantoprazole  (PROTONIX ) EC tablet 40 mg ( Oral Automatically Held 07/30/24 1700)  sodium chloride  irrigation 0.9 % (3,000 mLs Bladder Irrigation Given 07/22/24 1333)  iohexol  (OMNIPAQUE ) 180 MG/ML injection (10 mLs Other Given 07/22/24 1333)  ondansetron  (ZOFRAN ) injection 4 mg (4 mg Intravenous Given 07/22/24 0911)  ketorolac  (TORADOL ) 15 MG/ML injection 15 mg (15 mg Intravenous Given 07/22/24 0912)  iohexol  (OMNIPAQUE ) 300 MG/ML solution 100 mL (100  mLs Intravenous Contrast Given 07/22/24 0922)  sodium chloride  0.9 % bolus 1,000 mL (1,000 mLs Intravenous New Bag/Given 07/22/24 1010)     IMPRESSION / MDM / ASSESSMENT AND PLAN / ED COURSE  I reviewed the triage vital signs and the nursing notes.   Differential diagnosis includes, but is not limited to, gastroenteritis, pyelonephritis, diverticulitis, nephrolithiasis/ureteral  colic.  Patient is awake and alert, hemodynamically stable and afebrile.  She is nontoxic in appearance.  Her abdomen is soft, though mildly tender in the left lower quadrant  Further workup is indicated.  IV was established and labs were obtained.  Labs revealed leukocytosis to 15.1.  Urinalysis reveals small leukocytes and many bacteria.  Patient was treated with Toradol  and Zofran  while awaiting CT results.  CT abdomen and pelvis obtained reveals 3 stacked stones in the left ureter, the largest of which measures 7 mm with associated left hydronephrosis and perinephric fat stranding and edema.  She also has bilateral nephrolithiasis with the largest in the upper pole collecting system of the left kidney measuring 1.2 cm.  I consulted urology given her leukocytosis, bacteriuria, and ureteral stones.  Dr. Selma recommends admission to the hospitalist service and plan for stenting this afternoon.  Patient was given Rocephin .  Patient is in agreement with this plan.  I consulted hospitalist for admission.  Patient was accepted by Dr. Paudel.  Patient's presentation is most consistent with acute presentation with potential threat to life or bodily function.   Clinical Course as of 07/22/24 1339  Sat Jul 22, 2024  0944 Dr. Selma with urology paged [JP]  [phone] Discussed with Dr. Selma.  Plan for admission, antibiotics, and stenting later today [JP]    Clinical Course User Index [JP] Yoko Mcgahee E, PA-C     FINAL CLINICAL IMPRESSION(S) / ED DIAGNOSES   Final diagnoses:  Ureteral stone with hydronephrosis     Rx / DC  Orders   ED Discharge Orders     None        Note:  This document was prepared using Dragon voice recognition software and may include unintentional dictation errors.   Ema Hebner E, PA-C 07/22/24 1339    Bradler, Evan K, MD 07/22/24 [phone]  "

## 2024-07-22 NOTE — Op Note (Signed)
 Operative Note  Preoperative diagnosis:  1.  Left ureteral stone with hydronephrosis  Postoperative diagnosis: 1.  Same  Procedure(s): 1.  Cystoscopy 2.  Left retrograde pyelogram with interpretation 3.  Left ureteral stent placement 4. Fluoroscopy <1 hour with intraoperative interpretation  Surgeon: Donnice Siad, MD  Assistants:  None  Anesthesia:  General  Complications:  None  EBL: Minimal  Specimens: 1. Left renal pelvis urine culture  Drains/Catheters: 1.  Left 6Fr x 24 cm ureteral stent  Intraoperative findings:   Cystoscopy demonstrated no suspicious lesions, masses, stones or other pathology. Left retrograde pyelogram demonstrated severe left hydronephrosis. Successful left ureteral stent placement with curl in the renal pelvis and bladder respectively.  Indication:  Lori Henry is a 28 y.o. female with obstructing left ureteral stone with sign infection.  After reviewing the management options for treatment, she elected to proceed with the above surgical procedure(s). We have discussed the potential benefits and risks of the procedure, side effects of the proposed treatment, the likelihood of the patient achieving the goals of the procedure, and any potential problems that might occur during the procedure or recuperation. Informed consent has been obtained.  Description of procedure: The patient was taken to the operating room and general anesthesia was induced.  The patient was placed in the dorsal lithotomy position, prepped and draped in the usual sterile fashion, and preoperative antibiotics were administered. A preoperative time-out was performed.   Cystourethroscopy was performed.  The patients urethra was examined and was normal. The bladder was then systematically examined in its entirety. There was no evidence for any bladder tumors, stones, or other mucosal pathology.    Attention then turned to the left ureteral orifice. A 0.038 zip wire was  passed through the left orifice and over the wire a 5 Fr open ended catheter was inserted and passed up to the level of the renal pelvis. Aspirate was obtained and sent off as left renal pelvis urine for culture. Omnipaque  contrast was injected through the ureteral catheter and a retrograde pyelogram was performed with findings as dictated above. The wire was then replaced and the open ended catheter was removed.   A 6Fr x 24cm ureteral stent was advance over the wire. The stent was positioned appropriately under fluoroscopic and cystoscopic guidance.  The wire was then removed with an adequate stent curl noted in the renal pelvis as well as in the bladder.  The bladder was then emptied and the procedure ended.  The patient appeared to tolerate the procedure well and without complications.  The patient was able to be awakened and transferred to the recovery unit in satisfactory condition.   Plan: Continue IV antibiotics.  Will need at least 1 week course of outpatient biotics.  Message schedulers to arrange for outpatient follow-up with [facility].  Matt R. Maebel Marasco MD [facility]  Pager: [phone]

## 2024-07-22 NOTE — Anesthesia Postprocedure Evaluation (Signed)
"   Anesthesia Post Note  Patient: Lori Henry  Procedure(s) Performed: CYSTOSCOPY, WITH RETROGRADE PYELOGRAM AND URETERAL STENT INSERTION (Left)  Patient location during evaluation: PACU Anesthesia Type: General Level of consciousness: awake and alert Pain management: pain level controlled Vital Signs Assessment: post-procedure vital signs reviewed and stable Respiratory status: spontaneous breathing, nonlabored ventilation, respiratory function stable and patient connected to nasal cannula oxygen Cardiovascular status: blood pressure returned to baseline and stable Postop Assessment: no apparent nausea or vomiting Anesthetic complications: no   No notable events documented.   Last Vitals:  Vitals:   07/22/24 1507 07/22/24 1614  BP: (!) 132/98 (!) 125/93  Pulse: 74 88  Resp: 18 18  Temp: 36.8 C 36.7 C  SpO2: 93% 96%    Last Pain:  Vitals:   07/22/24 1614  TempSrc: Axillary  PainSc:                  Prentice Murphy      "

## 2024-07-23 ENCOUNTER — Encounter: Payer: Self-pay | Admitting: Urology

## 2024-07-23 DIAGNOSIS — Z6841 Body Mass Index (BMI) 40.0 and over, adult: Secondary | ICD-10-CM | POA: Diagnosis not present

## 2024-07-23 DIAGNOSIS — E282 Polycystic ovarian syndrome: Secondary | ICD-10-CM

## 2024-07-23 DIAGNOSIS — F3289 Other specified depressive episodes: Secondary | ICD-10-CM | POA: Diagnosis not present

## 2024-07-23 DIAGNOSIS — I10 Essential (primary) hypertension: Secondary | ICD-10-CM

## 2024-07-23 DIAGNOSIS — N200 Calculus of kidney: Secondary | ICD-10-CM

## 2024-07-23 DIAGNOSIS — N1330 Unspecified hydronephrosis: Secondary | ICD-10-CM | POA: Diagnosis not present

## 2024-07-23 LAB — COMPREHENSIVE METABOLIC PANEL WITH GFR
ALT: 119 U/L — ABNORMAL HIGH (ref 0–44)
AST: 65 U/L — ABNORMAL HIGH (ref 15–41)
Albumin: 4.2 g/dL (ref 3.5–5.0)
Alkaline Phosphatase: 94 U/L (ref 38–126)
Anion gap: 11 (ref 5–15)
BUN: 14 mg/dL (ref 6–20)
CO2: 23 mmol/L (ref 22–32)
Calcium: 9.1 mg/dL (ref 8.9–10.3)
Chloride: 104 mmol/L (ref 98–111)
Creatinine, Ser: 0.69 mg/dL (ref 0.44–1.00)
GFR, Estimated: >60 mL/min
Glucose, Bld: 126 mg/dL — ABNORMAL HIGH (ref 70–99)
Potassium: 3.6 mmol/L (ref 3.5–5.1)
Sodium: 138 mmol/L (ref 135–145)
Total Bilirubin: 0.4 mg/dL (ref 0.0–1.2)
Total Protein: 7.4 g/dL (ref 6.5–8.1)

## 2024-07-23 LAB — HIV ANTIBODY (ROUTINE TESTING W REFLEX): HIV Screen 4th Generation wRfx: NONREACTIVE

## 2024-07-23 LAB — CBC
HCT: 37.8 % (ref 36.0–46.0)
Hemoglobin: 12.5 g/dL (ref 12.0–15.0)
MCH: 28 pg (ref 26.0–34.0)
MCHC: 33.1 g/dL (ref 30.0–36.0)
MCV: 84.8 fL (ref 80.0–100.0)
Platelets: 311 K/uL (ref 150–400)
RBC: 4.46 MIL/uL (ref 3.87–5.11)
RDW: 13 % (ref 11.5–15.5)
WBC: 12.6 K/uL — ABNORMAL HIGH (ref 4.0–10.5)
nRBC: 0 % (ref 0.0–0.2)

## 2024-07-23 LAB — PROTIME-INR
INR: 1.1 (ref 0.8–1.2)
Prothrombin Time: 14.8 s (ref 11.4–15.2)

## 2024-07-23 LAB — GLUCOSE, CAPILLARY
Glucose-Capillary: 111 mg/dL — ABNORMAL HIGH (ref 70–99)
Glucose-Capillary: 115 mg/dL — ABNORMAL HIGH (ref 70–99)

## 2024-07-23 MED ORDER — POLYETHYLENE GLYCOL 3350 17 G PO PACK: 17.0000 g | PACK | Freq: Every day | ORAL | 0 refills | Status: AC | PRN

## 2024-07-23 MED ORDER — NYSTATIN 100000 UNIT/GM EX CREA
TOPICAL_CREAM | Freq: Two times a day (BID) | CUTANEOUS | Status: DC
  Filled 2024-07-23: qty 30

## 2024-07-23 MED ORDER — TAMSULOSIN HCL 0.4 MG PO CAPS
0.4000 mg | ORAL_CAPSULE | Freq: Every day | ORAL | Status: DC
  Administered 2024-07-23: 0.4 mg via ORAL
  Filled 2024-07-23: qty 1

## 2024-07-23 MED ORDER — METFORMIN HCL ER 500 MG PO TB24: 1000.0000 mg | ORAL_TABLET | Freq: Every day | ORAL | Status: AC

## 2024-07-23 MED ORDER — OXYBUTYNIN CHLORIDE 5 MG PO TABS: 5.0000 mg | ORAL_TABLET | Freq: Three times a day (TID) | ORAL | 0 refills | Status: DC | PRN

## 2024-07-23 MED ORDER — OXYBUTYNIN CHLORIDE 5 MG PO TABS: 5.0000 mg | ORAL_TABLET | Freq: Three times a day (TID) | ORAL | Status: DC | PRN

## 2024-07-23 MED ORDER — TAMSULOSIN HCL 0.4 MG PO CAPS: 0.4000 mg | ORAL_CAPSULE | Freq: Every day | ORAL | 0 refills | Status: DC

## 2024-07-23 MED ORDER — NYSTATIN 100000 UNIT/GM EX CREA: TOPICAL_CREAM | CUTANEOUS | 0 refills | Status: AC

## 2024-07-23 MED ORDER — OXYCODONE HCL 5 MG PO TABS: 5.0000 mg | ORAL_TABLET | Freq: Four times a day (QID) | ORAL | 0 refills | Status: DC | PRN

## 2024-07-23 MED ORDER — CEPHALEXIN 500 MG PO CAPS: 500.0000 mg | ORAL_CAPSULE | Freq: Three times a day (TID) | ORAL | 0 refills | Status: AC

## 2024-07-23 NOTE — Assessment & Plan Note (Signed)
 On metformin .  Can restart in a few days.

## 2024-07-23 NOTE — Progress Notes (Signed)
Discharge instructions reviewed with patient and her significant other.  Questions answered and follow up care reviewed.  Printed copies given to patient for reference after discharge home.

## 2024-07-23 NOTE — Hospital Course (Signed)
 28 y.o. female with medical history significant for DM on Mounjaro, HTN, morbid obesity, PCOS, PTSD, anxiety on fluoxetine  who came into ED complaining of abdominal pain for 4 days.  She had a history of kidney stone and passed without any treatment.  She stated that her abdominal pain was crampy, severe, 7/18 intensity, sharp, left lumbar area radiating down, associate with some diarrhea.  Patient denies any fever, chills, dysuria, hematuria, vaginal discharge or bleeding.   ED Course: Upon arrival to the ED, patient is found to be hypertensive at 150/106, white count 15.1, urine analysis was positive for UTI, CT scan of the abdomen and pelvis showed bilateral nephrolithiasis, multiple stacked stone in the left ureter with hydronephrosis.  Patient was given ceftriaxone , urology was consulted and hospitalist service was consulted for evaluation for admission.  Dr. Donnice Siad brought to the operating room for left ureteral stent   12/21.  Patient having a little discomfort in the left lower quadrant.  Case discussed with Dr. Siad and okay with Ditropan  as needed for bladder spasms and Flomax .  As needed pain medication.  Patient will be discharged home and follow-up with urology as outpatient.  Encouraged drinking fluids.  Mounjaro can increase risk of kidney stones can speak with medical doctor about this medication.

## 2024-07-23 NOTE — Assessment & Plan Note (Signed)
On lisinopril

## 2024-07-23 NOTE — Assessment & Plan Note (Addendum)
On Prozac

## 2024-07-23 NOTE — Discharge Summary (Signed)
 " Physician Discharge Summary   Patient: Lori Henry MRN: [id number] DOB: September 25, 1995  Admit date:     07/22/2024  Discharge date: 07/23/2024  Discharge Physician: Charlie Patterson   PCP: System, Provider Not In   Recommendations at discharge:  Follow-up PCP 5 days Follow-up urology 1 week  Discharge Diagnoses: Principal Problem:   Hydronephrosis of left kidney Active Problems:   Nephrolithiasis   Depression   Essential hypertension   Morbid obesity with BMI of 45.0-49.9, adult (HCC)   PCOS (polycystic ovarian syndrome)   Ureteric stone    Hospital Course: 28 y.o. female with medical history significant for DM on Mounjaro, HTN, morbid obesity, PCOS, PTSD, anxiety on fluoxetine  who came into ED complaining of abdominal pain for 4 days.  She had a history of kidney stone and passed without any treatment.  She stated that her abdominal pain was crampy, severe, 7/18 intensity, sharp, left lumbar area radiating down, associate with some diarrhea.  Patient denies any fever, chills, dysuria, hematuria, vaginal discharge or bleeding.   ED Course: Upon arrival to the ED, patient is found to be hypertensive at 150/106, white count 15.1, urine analysis was positive for UTI, CT scan of the abdomen and pelvis showed bilateral nephrolithiasis, multiple stacked stone in the left ureter with hydronephrosis.  Patient was given ceftriaxone , urology was consulted and hospitalist service was consulted for evaluation for admission.  Dr. Donnice Siad brought to the operating room for left ureteral stent   12/21.  Patient having a little discomfort in the left lower quadrant.  Case discussed with Dr. Siad and okay with Ditropan  as needed for bladder spasms and Flomax .  As needed pain medication.  Patient will be discharged home and follow-up with urology as outpatient.  Encouraged drinking fluids.  Mounjaro can increase risk of kidney stones can speak with medical doctor about this  medication.  Assessment and Plan: * Hydronephrosis of left kidney Urology brought to the operating room on 12/20 for left ureteral stone.  Urology okay with discharge home.  As needed Ditropan  for bladder spasms.  Nephrolithiasis 3 stone seen on CT scan.  Will need outpatient follow-up.  Follow-up urine culture.  Keflex  for 1 week as outpatient.  Flomax  for kidney stone.  Small prescription for pain medication.  PCOS (polycystic ovarian syndrome) On metformin .  Can restart in a few days.  Morbid obesity with BMI of 45.0-49.9, adult (HCC) BMI 45.17.  Patient on Mounjaro.  Explained that Mounjaro can increase risk of kidney stones advised to speak with provider about this medication.  Essential hypertension On lisinopril   Depression On Prozac          Consultants: Urology Procedures performed: Left ureteral stent Disposition: Home Diet recommendation:  Cardiac and Carb modified diet DISCHARGE MEDICATION: Allergies as of 07/23/2024       Reactions   Bee Venom Anaphylaxis, Itching   Other Reaction(s): eye redness, facial swelling        Medication List     TAKE these medications    cephALEXin  500 MG capsule Commonly known as: KEFLEX  Take 1 capsule (500 mg total) by mouth 3 (three) times daily for 7 days. Start taking on: July 24, 2024   FLUoxetine  40 MG capsule Commonly known as: PROZAC  Take 40 mg by mouth daily.   lisinopril  20 MG tablet Commonly known as: ZESTRIL  Take 20 mg by mouth daily.   metFORMIN  500 MG 24 hr tablet Commonly known as: GLUCOPHAGE -XR Take 2 tablets (1,000 mg total) by mouth daily with  breakfast. Start taking on: July 25, 2024 What changed: These instructions start on July 25, 2024. If you are unsure what to do until then, ask your doctor or other care provider.   Mounjaro 15 MG/0.5ML Pen Generic drug: tirzepatide Inject 15 mg into the skin once a week.   norethindrone  0.35 MG tablet Commonly known as: Ortho  Micronor  Take 1 tablet (0.35 mg total) by mouth daily.   nystatin  cream Commonly known as: MYCOSTATIN  To reddened skin under skin folds twice a day   omeprazole  20 MG capsule Commonly known as: PRILOSEC  Take 20 mg by mouth daily.   oxybutynin  5 MG tablet Commonly known as: DITROPAN  Take 1 tablet (5 mg total) by mouth every 8 (eight) hours as needed for bladder spasms.   oxyCODONE  5 MG immediate release tablet Commonly known as: Oxy IR/ROXICODONE  Take 1 tablet (5 mg total) by mouth every 6 (six) hours as needed for severe pain (pain score 7-10).   polyethylene glycol 17 g packet Commonly known as: MIRALAX  / GLYCOLAX  Take 17 g by mouth daily as needed for mild constipation.   prenatal multivitamin Tabs tablet Take 1 tablet by mouth daily at 12 noon.   tamsulosin  0.4 MG Caps capsule Commonly known as: FLOMAX  Take 1 capsule (0.4 mg total) by mouth daily.        Follow-up Information     [facility] Follow up in 1 week(s).   Specialty: Urology Contact information: [address] [place], [address] [place]  [address] [phone]        your medical doctor Follow up in 5 day(s).                 Discharge Exam: Filed Weights   07/22/24 0542  Weight: 115.7 kg   Physical Exam HENT:     Head: Normocephalic.     Mouth/Throat:     Pharynx: No oropharyngeal exudate.  Cardiovascular:     Rate and Rhythm: Normal rate and regular rhythm.     Heart sounds: Normal heart sounds, S1 normal and S2 normal.  Pulmonary:     Breath sounds: No decreased breath sounds, wheezing, rhonchi or rales.  Abdominal:     Tenderness: There is abdominal tenderness in the left lower quadrant.  Musculoskeletal:     Right lower leg: Swelling present.     Left lower leg: Swelling present.  Skin:    General: Skin is warm.     Findings: No rash.  Neurological:     Mental Status: She is alert and oriented to person, place, and time.      Condition  at discharge: stable  The results of significant diagnostics from this hospitalization (including imaging, microbiology, ancillary and laboratory) are listed below for reference.   Imaging Studies: DG OR UROLOGY CYSTO IMAGE ([facility] ONLY) Result Date: 07/22/2024 There is no interpretation for this exam.  This order is for images obtained during a surgical procedure.  Please See Surgeries Tab for more information regarding the procedure.   CT ABDOMEN PELVIS W CONTRAST Result Date: 07/22/2024 EXAM: CT ABDOMEN AND PELVIS WITH CONTRAST 07/22/2024 09:29:56 AM TECHNIQUE: CT of the abdomen and pelvis was performed with the administration of 100 mL of iohexol  (OMNIPAQUE ) 300 MG/ML solution. Multiplanar reformatted images are provided for review. Automated exposure control, iterative reconstruction, and/or weight-based adjustment of the mA/kV was utilized to reduce the radiation dose to as low as reasonably achievable. COMPARISON: 01/29/2019 CLINICAL HISTORY: LLQ abdominal pain; diarrhea. FINDINGS: LOWER CHEST: No  acute abnormality. LIVER: Hepatic steatosis. GALLBLADDER AND BILE DUCTS: Status post cholecystectomy. No biliary ductal dilatation. SPLEEN: No acute abnormality. PANCREAS: No acute abnormality. ADRENAL GLANDS: No acute abnormality. KIDNEYS, URETERS AND BLADDER: Bilateral nephrolithiasis. The largest stone is in the upper pole collecting system of the left kidney measuring 1.2 cm (image 39/2). Asymmetric perinephric fat stranding, edema, and hydronephrosis are noted involving the left kidney. Calcification within the central renal pelvis measures 3 mm. There are at least 3 stacked stones within the proximal left ureter, the largest measures 7 mm (coronal image 63/5). No perinephric or periureteral stranding is noted involving the right kidney or ureter. Urinary bladder is unremarkable. GI AND BOWEL: Stomach demonstrates no acute abnormality. The appendix is visualized and appears normal. There is no bowel  obstruction. PERITONEUM AND RETROPERITONEUM: No ascites. No free air. VASCULATURE: Aorta is normal in caliber. LYMPH NODES: No lymphadenopathy. REPRODUCTIVE ORGANS: The uterus appears normal. No adnexal mass. BONES AND SOFT TISSUES: No acute or suspicious osseous findings. No focal soft tissue abnormality. IMPRESSION: 1. At least 3 stacked stones within the proximal left ureter, the largest measuring 7 mm, with associated left hydronephrosis and perinephric fat stranding/edema. 2. Bilateral nephrolithiasis with the largest stone in the upper pole collecting system of the left kidney measuring 1.2 cm. Electronically signed by: Waddell Calk MD 07/22/2024 09:37 AM EST RP Workstation: [id number]    Microbiology: Urine culture pending (Urine analysis had a lot of squamous cells)  Labs: CBC: Recent Labs  Lab 07/22/24 0545 07/23/24 0622  WBC 15.1* 12.6*  HGB 13.3 12.5  HCT 41.2 37.8  MCV 86.6 84.8  PLT 326 311   Basic Metabolic Panel: Recent Labs  Lab 07/22/24 0545 07/23/24 0622  NA 139 138  K 3.6 3.6  CL 105 104  CO2 23 23  GLUCOSE 95 126*  BUN 17 14  CREATININE 0.74 0.69  CALCIUM 9.5 9.1   Liver Function Tests: Recent Labs  Lab 07/22/24 0545 07/23/24 0622  AST 32 65*  ALT 52* 119*  ALKPHOS 81 94  BILITOT 0.5 0.4  PROT 8.0 7.4  ALBUMIN 4.4 4.2   CBG: Recent Labs  Lab 07/22/24 1356 07/22/24 1709 07/22/24 2202 07/23/24 0829 07/23/24 1134  GLUCAP 92 104* 161* 111* 115*    Discharge time spent: greater than 30 minutes.  Signed: Charlie Patterson, MD [facility] 07/23/2024 "

## 2024-07-23 NOTE — Assessment & Plan Note (Addendum)
 Urology brought to the operating room on 12/20 for left ureteral stone.  Urology okay with discharge home.  As needed Ditropan  for bladder spasms.

## 2024-07-23 NOTE — Assessment & Plan Note (Addendum)
 3 stone seen on CT scan.  Will need outpatient follow-up.  Follow-up urine culture.  Keflex  for 1 week as outpatient.  Flomax  for kidney stone.  Small prescription for pain medication.

## 2024-07-23 NOTE — Assessment & Plan Note (Addendum)
 BMI 45.17.  Patient on Mounjaro.  Explained that Mounjaro can increase risk of kidney stones advised to speak with provider about this medication.

## 2024-07-23 NOTE — Discharge Instructions (Signed)
 Monjaro can increase risk of kidney stone.  Please stay hydrated.

## 2024-07-24 ENCOUNTER — Other Ambulatory Visit: Payer: Self-pay

## 2024-07-24 ENCOUNTER — Telehealth: Payer: Self-pay

## 2024-07-24 ENCOUNTER — Other Ambulatory Visit: Payer: Self-pay | Admitting: Urology

## 2024-07-24 DIAGNOSIS — N201 Calculus of ureter: Secondary | ICD-10-CM

## 2024-07-24 DIAGNOSIS — N200 Calculus of kidney: Secondary | ICD-10-CM

## 2024-07-24 LAB — URINE CULTURE: Culture: NO GROWTH

## 2024-07-24 LAB — GLUCOSE, CAPILLARY: Glucose-Capillary: 94 mg/dL (ref 70–99)

## 2024-07-24 NOTE — Telephone Encounter (Signed)
 Per Dr. Francisca, Patient is to be scheduled for Left Ureteroscopy with Laser Lithotripsy and Stent Exchange   Ms. Gieske was contacted and possible surgical dates were discussed, Monday January 12th, 2026 was agreed upon for surgery.   Patient was instructed that Dr. Francisca will require them to provide a pre-op UA & CX prior to surgery. This was ordered and scheduled drop off appointment was made for 08/01/2024.    Patient was directed to call [phone] between 1-3pm the day before surgery to find out surgical arrival time.  Instructions were given not to eat or drink from midnight on the night before surgery and have a driver for the day of surgery. On the surgery day patient was instructed to enter through the [facility] entrance of [facility] report the Same Day Surgery desk.   Pre-Admit Testing will be in contact via phone to set up an interview with the anesthesia team to review your history and medications prior to surgery.   Reminder of this information was sent via MyChart to the patient.

## 2024-07-24 NOTE — Progress Notes (Signed)
 Surgical Physician Order Form [facility] [place]  Dr. Redell Burnet, MD  * Scheduling expectation : 2 to 4 weeks  *Length of Case: 1.5 hours  *Clearance needed: no  *Anticoagulation Instructions: May continue all anticoagulants  *Aspirin  Instructions: Ok to continue Aspirin   *Post-op visit Date/Instructions:  tbd  *Diagnosis: Left Ureteral Stone  *Procedure: left  Ureteroscopy w/laser lithotripsy & stent exchange (47643)   Additional orders: N/A  -Admit type: OUTpatient  -Anesthesia: General  -VTE Prophylaxis Standing Order SCD's       Other:   -Standing Lab Orders Per Anesthesia    Lab other: UA&Urine Culture  -Standing Test orders EKG/Chest x-ray per Anesthesia       Test other:   - Medications:  Ancef  2gm IV  -Other orders:  N/A

## 2024-07-24 NOTE — Progress Notes (Signed)
" ° °  [facility] Surgical Posting Form  Surgery Date: Date: 08/14/2024  Surgeon: Dr. Redell Burnet, MD  Inpt ( No  )   Outpt (Yes)   Obs ( No  )   Diagnosis: N20.1 Left Ureteral Stone  -CPT: [phone]  Surgery: Left Ureteroscopy with Laser Lithotripsy and Stent Exchange  Stop Anticoagulations: No, may continue all  Cardiac/Medical/Pulmonary Clearance needed: no  *Orders entered into EPIC  Date: 07/24/2024   *Case booked in [place]  Date: 07/24/2024  *Notified pt of Surgery: Date: 07/24/2024  PRE-OP UA & CX: yes, will obtain in clinic on 08/01/2024  *Placed into Prior Authorization Work [place] Date: 07/24/2024  Assistant/laser/rep:No                "

## 2024-07-27 ENCOUNTER — Other Ambulatory Visit: Payer: Self-pay

## 2024-07-27 ENCOUNTER — Emergency Department
Admission: EM | Admit: 2024-07-27 | Discharge: 2024-07-27 | Disposition: A | Discharge status: Home / Self Care | Attending: Emergency Medicine | Admitting: Emergency Medicine

## 2024-07-27 ENCOUNTER — Emergency Department

## 2024-07-27 DIAGNOSIS — B3731 Acute candidiasis of vulva and vagina: Secondary | ICD-10-CM | POA: Insufficient documentation

## 2024-07-27 DIAGNOSIS — N200 Calculus of kidney: Secondary | ICD-10-CM | POA: Insufficient documentation

## 2024-07-27 DIAGNOSIS — I10 Essential (primary) hypertension: Secondary | ICD-10-CM | POA: Insufficient documentation

## 2024-07-27 DIAGNOSIS — R1032 Left lower quadrant pain: Secondary | ICD-10-CM | POA: Diagnosis present

## 2024-07-27 HISTORY — DX: Type 2 diabetes mellitus without complications: E11.9

## 2024-07-27 HISTORY — DX: Renal tubulo-interstitial disease, unspecified: N15.9

## 2024-07-27 LAB — COMPREHENSIVE METABOLIC PANEL WITH GFR
ALT: 70 U/L — ABNORMAL HIGH (ref 0–44)
AST: 33 U/L (ref 15–41)
Albumin: 4.2 g/dL (ref 3.5–5.0)
Alkaline Phosphatase: 91 U/L (ref 38–126)
Anion gap: 11 (ref 5–15)
BUN: 15 mg/dL (ref 6–20)
CO2: 23 mmol/L (ref 22–32)
Calcium: 9.6 mg/dL (ref 8.9–10.3)
Chloride: 104 mmol/L (ref 98–111)
Creatinine, Ser: 0.54 mg/dL (ref 0.44–1.00)
GFR, Estimated: >60 mL/min
Glucose, Bld: 110 mg/dL — ABNORMAL HIGH (ref 70–99)
Potassium: 4.1 mmol/L (ref 3.5–5.1)
Sodium: 138 mmol/L (ref 135–145)
Total Bilirubin: 0.4 mg/dL (ref 0.0–1.2)
Total Protein: 7.6 g/dL (ref 6.5–8.1)

## 2024-07-27 LAB — CBC
HCT: 39 % (ref 36.0–46.0)
Hemoglobin: 12.7 g/dL (ref 12.0–15.0)
MCH: 27.7 pg (ref 26.0–34.0)
MCHC: 32.6 g/dL (ref 30.0–36.0)
MCV: 85.2 fL (ref 80.0–100.0)
Platelets: 325 K/uL (ref 150–400)
RBC: 4.58 MIL/uL (ref 3.87–5.11)
RDW: 13.2 % (ref 11.5–15.5)
WBC: 11 K/uL — ABNORMAL HIGH (ref 4.0–10.5)
nRBC: 0 % (ref 0.0–0.2)

## 2024-07-27 LAB — URINALYSIS, ROUTINE W REFLEX MICROSCOPIC
Bilirubin Urine: NEGATIVE
Glucose, UA: NEGATIVE mg/dL
Ketones, ur: NEGATIVE mg/dL
Nitrite: NEGATIVE
Protein, ur: NEGATIVE mg/dL
Specific Gravity, Urine: 1.024 (ref 1.005–1.030)
pH: 5 (ref 5.0–8.0)

## 2024-07-27 LAB — POC URINE PREG, ED: Preg Test, Ur: NEGATIVE

## 2024-07-27 LAB — LIPASE, BLOOD: Lipase: 50 U/L (ref 11–51)

## 2024-07-27 MED ORDER — NAPROXEN 500 MG PO TBEC: 500.0000 mg | DELAYED_RELEASE_TABLET | Freq: Two times a day (BID) | ORAL | 0 refills | Status: DC

## 2024-07-27 MED ORDER — TRAMADOL HCL 50 MG PO TABS: 50.0000 mg | ORAL_TABLET | Freq: Four times a day (QID) | ORAL | 0 refills | Status: AC | PRN

## 2024-07-27 MED ORDER — MORPHINE SULFATE (PF) 4 MG/ML IV SOLN
4.0000 mg | Freq: Once | INTRAVENOUS | Status: AC
  Administered 2024-07-27: 4 mg via INTRAVENOUS
  Filled 2024-07-27: qty 1

## 2024-07-27 MED ORDER — ONDANSETRON HCL 4 MG/2ML IJ SOLN
4.0000 mg | Freq: Once | INTRAMUSCULAR | Status: AC
  Administered 2024-07-27: 4 mg via INTRAVENOUS
  Filled 2024-07-27: qty 2

## 2024-07-27 MED ORDER — FLUCONAZOLE 150 MG PO TABS: 150.0000 mg | ORAL_TABLET | Freq: Every day | ORAL | 0 refills | Status: AC

## 2024-07-27 NOTE — Discharge Instructions (Addendum)
 You have been diagnosed with vaginal candidiasis or yeast infection, bilateral nephrolithiasis.  Please take fluconazole  1 tablet by mouth for yeast infection.  Please continue taking Keflex  as prescribed by your urology.  Please take naproxen  1 tablet by mouth every 12 hours after main meals.  Please take tramadol  1 tablet by mouth every 6 hours as needed for pain.  Please drink plenty of fluids.  Please come back to ED if you have new symptoms symptoms worsen.  Please call urology and make an appointment for a follow-up.

## 2024-07-27 NOTE — ED Provider Notes (Signed)
 "  [facility] Provider Note    Event Date/Time   First MD Initiated Contact with Patient 07/27/24 1615     (approximate)   History   Abdominal Pain and Dysuria    HPI  Lori Henry is a 28 y.o. female    with a past medical history of diabetes type 2, nephrolithiasis, UTI, atypical chest pain, who presents to the ED complaining of dysuria, vaginal discharge, left lower quadrant pain. According to the patient, symptoms started yesterday with left lower quadrant abdominal pain, white vaginal discharge, itchiness, burning sensation when she urinates.  Patient describes pain 9/10.  Patient was seen by urology and have a stent placed, patient is taking Keflex .  Patient is here with her fianc.     Patient Active Problem List   Diagnosis Date Noted   Nephrolithiasis 07/23/2024   Ureteric stone 07/22/2024   Hydronephrosis of left kidney 07/22/2024   History of gestational diabetes mellitus (GDM) 09/28/2022   Morbid obesity with BMI of 45.0-49.9, adult (HCC)    History of kidney stones 09/27/2021   History of recurrent UTIs 09/27/2021   PCOS (polycystic ovarian syndrome) 08/21/2020   Essential hypertension 08/15/2019   PTSD (post-traumatic stress disorder) 08/15/2019   Depression 08/14/2019   Panic attack    Hx of seizure disorder 06/06/2013     Physical Exam   Triage Vital Signs: ED Triage Vitals  Encounter Vitals Group     BP 07/27/24 1517 139/83     Girls Systolic BP Percentile --      Girls Diastolic BP Percentile --      Boys Systolic BP Percentile --      Boys Diastolic BP Percentile --      Pulse Rate 07/27/24 1517 93     Resp 07/27/24 1517 20     Temp 07/27/24 1517 98.4 F (36.9 C)     Temp Source 07/27/24 1517 Oral     SpO2 07/27/24 1517 97 %     Weight 07/27/24 1520 254 lb 13.6 oz (115.6 kg)     Height 07/27/24 1520 5' 3 (1.6 m)     Head Circumference --      Peak Flow --      Pain Score 07/27/24 1518 9     Pain Loc --       Pain Education --      Exclude from Growth Chart --     Most recent vital signs: Vitals:   07/27/24 1517 07/27/24 1834  BP: 139/83 115/76  Pulse: 93 82  Resp: 20 18  Temp: 98.4 F (36.9 C) 98.7 F (37.1 C)  SpO2: 97% 90%     Physical Exam Vitals and nursing note reviewed.  During triage patient had systolic hypertension.  General:          Awake, no distress.  CV:                  Good peripheral perfusion. Regular rate and rhythm. Resp:               Normal effort. no tachypnea.Equal breath sounds bilaterally.  Abd:               All sounds positive, no ecchymosis no hematomas no scars.  Tender to palpation in left lower quadrant.  Negative Murphy, negative rebound, negative McBurney point.  No distention.  Soft.  Other:    No tenderness to palpations and cough  ED Results / Procedures / Treatments   Labs (all labs ordered are listed, but only abnormal results are displayed) Labs Reviewed  COMPREHENSIVE METABOLIC PANEL WITH GFR - Abnormal; Notable for the following components:      Result Value   Glucose, Bld 110 (*)    ALT 70 (*)    All other components within normal limits  CBC - Abnormal; Notable for the following components:   WBC 11.0 (*)    All other components within normal limits  URINALYSIS, ROUTINE W REFLEX MICROSCOPIC - Abnormal; Notable for the following components:   Color, Urine YELLOW (*)    APPearance HAZY (*)    Hgb urine dipstick MODERATE (*)    Leukocytes,Ua MODERATE (*)    Bacteria, UA RARE (*)    All other components within normal limits  URINE CULTURE  LIPASE, BLOOD  POC URINE PREG, ED      RADIOLOGY I independently reviewed and interpreted imaging and agree with radiologists findings.      PROCEDURES:  Critical Care performed:   Procedures   MEDICATIONS ORDERED IN ED: Medications  morphine  (PF) 4 MG/ML injection 4 mg (4 mg Intravenous Given 07/27/24 1829)  ondansetron  (ZOFRAN ) injection 4 mg (4 mg Intravenous Given  07/27/24 1830)   Clinical Course as of 07/27/24 1925  Thu Jul 27, 2024  1744 POC urine preg, ED Negative [AE]  1744 Presence of hemoglobin, rare bacteria, moderate leukocytes.  [AE]  1744 Comprehensive metabolic panel(!) Electrolytes, renal function, AST, bilirubin, anion gap within normal limits. [AE]  1745 Lipase, blood Negative [AE]  1745 CBC(!) Leukocytosis, white blood cells 11, hemoglobin and platelets within normal limits [AE]  1904 CT ABDOMEN PELVIS WO CONTRAST Properly positioned left-sided endo ureteral stent with two 6 mm proximal left ureteral calculi. 2. Bilateral nonobstructing renal calculi. 3. Hepatic steatosis. 4. Evidence of prior cholecystectomy.   [AE]  1919 The patient for results of abdominal CT.  Patient is going to be discharged with tramadol , naproxen .  And follow-up with urology.  Patient is scheduled for for lithotripsy on January 12.  She is agreeable with the plan.  [AE]    Clinical Course User Index [AE] Janit Kast, PA-C    IMPRESSION / MDM / ASSESSMENT AND PLAN / ED COURSE  I reviewed the triage vital signs and the nursing notes.  Differential diagnosis includes, but is not limited to, nephrolithiasis, diverticulitis, postoperative complication, UTI, vaginal candidiasis, ectopic  Patient's presentation is most consistent with acute complicated illness / injury requiring diagnostic workup.   Lori Henry is a 28 y.o., female presents today with history of 24 hours of left lower quadrant pain, white vaginal discharge, itchiness, dysuria.  See HPI for further information.  On a physical exam, vital signs were normal.  Cardiopulmonary is clear, abdomen, bowel sounds positive, no ecchymosis or hematomas, no scars.  Tenderness to deep palpation in the left lower quadrant.  No rebound, negative McBurney point, negative Murphy. Plan Noncontrast CT Morphine  Zofran  Urine culture Abdominal CT without contrast reported bilateral  nephrolithiasis, stent is in the right position. Patient's diagnosis is consistent with vaginal candidiasis, bilateral nephrolithiasis. I independently reviewed and interpreted imaging and agree with radiologists findings. Labs are  reassuring. I did review the patient's allergies and medications.The patient is in stable and satisfactory condition for discharge home  Patient will be discharged home with prescriptions for naproxen , tramadol , , clotrimazole. Patient is to follow up with allergy as needed or otherwise directed. Patient is given ED precautions to  return to the ED for any worsening or new symptoms.  Work note will be provided. Discussed plan of care with patient, answered all of patient's questions, patient agreeable to plan of care. Advised patient to take medications according to the instructions on the label. Discussed possible side effects of new medications. Patient verbalized understanding.  FINAL CLINICAL IMPRESSION(S) / ED DIAGNOSES   Final diagnoses:  Vaginal candidiasis  Nephrolithiasis     Rx / DC Orders   ED Discharge Orders          Ordered    fluconazole  (DIFLUCAN ) 150 MG tablet  Daily       Note to Pharmacy: Please take 1 tablet by mouth once a week for 2 weeks   07/27/24 1858    naproxen  (EC NAPROSYN ) 500 MG EC tablet  2 times daily with meals        07/27/24 1925    traMADol  (ULTRAM ) 50 MG tablet  Every 6 hours PRN        07/27/24 1925             Note:  This document was prepared using Dragon voice recognition software and may include unintentional dictation errors.   Janit Kast, PA-C 07/27/24 1925  "

## 2024-07-27 NOTE — ED Triage Notes (Signed)
 Pt to ED for dysuria, white vaginal discharge and vaginal/vulvar irritation, cramping to LLQ of abdomen since yesterday. Pain is worse with sitting.  Had ureteral stent placed on 12/20. Denies hematuria. Denies oliguria.

## 2024-07-28 NOTE — ED Provider Notes (Signed)
 " [facility] EMERGENCY DEPARTMENT  ED Provider Note History   Chief Complaint  Patient presents with   Nephrolithiasis   History of Present Illness Lori Henry is a 28 y.o. female who presents to the ED for evaluation of left sided flank pain  The patient reports that she was seen at an outside hospital emergency department for flank pain 6 days ago.  She was diagnosed with an obstructing stone in the left ureter and had a stent placed.  She was also noted to have a UTI started on Ceftin and was referred to urology for lithotripsy which is scheduled for January 12. She returned to OSH ED yesterday with continued L sided flank pain and was diagnosed at that time with a vaginal yeast infection. She was encouraged to continue taking her antibiotics and treated for yeast with fluconazole . She has been taking naproxen , oxycodone , tramadol  for pain. She came to the ED today because she was hoping we could do her lithotripsy sooner. No fevers, chills, sweats. Continues to have L sided flank pain.   Level of Interpreter Services: No interpreter needed (no language barrier) Past Medical History:  Diagnosis Date   Bee sting allergy    Febrile seizure (CMS/HHS-HCC)    Gestational diabetes (HHS-HCC)    Hyperglycemia    Hypertension    Obesity    Recurrent major depressive disorder, in partial remission () 12/14/2014   Past Surgical History:  Procedure Laterality Date   biposy mouth     CESAREAN SECTION     ORAL SURGERY OPERATIVE NOTE     Family History  Problem Relation Age of Onset   High blood pressure (Hypertension) Mother    Diabetes Mother    High blood pressure (Hypertension) Father    High blood pressure (Hypertension) Maternal Grandfather    Diabetes Maternal Grandfather    Social History   Socioeconomic History   Marital status: Single  Tobacco Use   Smoking status: Never   Smokeless tobacco: Never  Vaping Use   Vaping status: Never Used   Substance and Sexual Activity   Alcohol use: No   Drug use: No   Sexual activity: Never  Social History Narrative   Lives with mom, dad, 2 sisters and brother, 2 dogs.   Social Drivers of Corporate Investment Banker Strain: Low Risk  (01/10/2024)   Overall Financial Resource Strain (CARDIA)    Difficulty of Paying Living Expenses: Not hard at all  Recent Concern: Financial Resource Strain - Medium Risk (11/09/2023)   Overall Financial Resource Strain (CARDIA)    Difficulty of Paying Living Expenses: Somewhat hard  Food Insecurity: No Food Insecurity (07/23/2024)   Received from [facility]   Hunger Vital Sign    Within the past 12 months, you worried that your food would run out before you got the money to buy more.: Never true    Within the past 12 months, the food you bought just didn't last and you didn't have money to get more.: Never true  Transportation Needs: No Transportation Needs (07/23/2024)   Received from [facility] Transportation    In the past 12 months, has lack of transportation kept you from medical appointments or from getting medications?: No    In the past 12 months, has lack of transportation kept you from meetings, work, or from getting things needed for daily living?: No  Housing Stability: Low Risk  (01/10/2024)   Housing Stability Vital Sign    Unable  to Pay for Housing in the Last Year: No    Number of Times Moved in the Last Year: 1    Homeless in the Last Year: No  Recent Concern: Housing Stability - High Risk (11/09/2023)   Housing Stability Vital Sign    Unable to Pay for Housing in the Last Year: Yes    Number of Times Moved in the Last Year: 1    Homeless in the Last Year: No    Review of Systems: All other systems negative except as noted in HPI.   Physical Exam  BP (!) 136/93 (BP Location: Left upper arm, Patient Position: Sitting)   Pulse 89   Temp 36.7 C (98.1 F) (Oral)   Resp 18   SpO2 96%  Gen: well appearing,  in no acute distress  Head: normocephalic, atraumatic  HEENT: EOMI bl, oropharynx clear  CV: RRR, no m/r/g, extremities warm and well perfused  Resp: lungs CTAB, no wheezes/rhonchi/rales non-labored breathing on room air  Abd: soft, non-distended, +LLQ tenderness w/o rebound or guarding  MSK: no deformities. nml bulk and tone throughout. Neuro: A&O x 3, no focal deficits noted  Skin: no rashes, lesions, ecchymoses, or breakdown Psych: calm, cooperative   CT A/P from 07/27/24:  CLINICAL DATA:  Left lower quadrant abdominal pain.   EXAM:  CT ABDOMEN AND PELVIS WITHOUT CONTRAST   TECHNIQUE:  Multidetector CT imaging of the abdomen and pelvis was performed  following the standard protocol without IV contrast.   RADIATION DOSE REDUCTION: This exam was performed according to the  departmental dose-optimization program which includes automated  exposure control, adjustment of the mA and/or kV according to  patient size and/or use of iterative reconstruction technique.   COMPARISON:  July 22, 2024   FINDINGS:  Lower chest: No acute abnormality.   Hepatobiliary: There is diffuse fatty infiltration of the liver  parenchyma. No focal liver abnormality is seen. Status post  cholecystectomy. No biliary dilatation.   Pancreas: Unremarkable. No pancreatic ductal dilatation or  surrounding inflammatory changes.   Spleen: Normal in size without focal abnormality.   Adrenals/Urinary Tract: Adrenal glands are unremarkable. Kidneys are  normal in size, without focal lesions. A properly positioned  left-sided endo ureteral stent is seen. Two 6 mm proximal ureteral  calculi are also noted. A 2 mm nonobstructing renal calculus is seen  within the anterior aspect of the mid right kidney. 2 mm, 3 mm and  10 mm nonobstructing renal calculi are noted within the left kidney.  Bladder is unremarkable.   Stomach/Bowel: Stomach is within normal limits. Appendix appears  normal. No evidence of  bowel wall thickening, distention, or  inflammatory changes.   Vascular/Lymphatic: No significant vascular findings are present. No  enlarged abdominal or pelvic lymph nodes.   Reproductive: Uterus and bilateral adnexa are unremarkable.   Other: No abdominal wall hernia or abnormality. No abdominopelvic  ascites.   Musculoskeletal: No acute or significant osseous findings.   IMPRESSION:  1. Properly positioned left-sided endo ureteral stent with two 6 mm  proximal left ureteral calculi.  2. Bilateral nonobstructing renal calculi.  3. Hepatic steatosis.  4. Evidence of prior cholecystectomy.    Electronically Signed    By: Suzen Dials M.D.    On: 07/27/2024 18:56   Procedures  Procedures TIP (no need to delete)  Delete the Procedures section if patient does not have a procedure.  Medical Decision Making and ED Course  Given the patient's initial exam and evaluation, the following  diagnostic evaluation has been ordered. The patient will be placed in the appropriate treatment space, once one is available to complete the evaluation and treatment. I have discussed the plan of care with the patient/representative and have advised that the plan of care may change based upon the results of diagnostic testing.   Lori Henry is a 28 y.o. female who presents to the ED for evaluation of left sided flank pain CT A/P from yesterday as above with a properly positioned ureteral stent on the left. UA c/w UTI for which pt is being treated with a cephalosporin abx. No fever , tachycardia, or leukocytosis to indicate treatment failure. Has lithotripsy scheduled for Jan 12 but was hoping we could do this sooner. I spoke with her about limitations in the ED- we do not have the ability to perform lithotripsy here and as she already has a stent in place and is not presenting with s/s of sepsis, there wouldn't be an indication for admission for emergent intervention. She did report that  morphine  worked better for her pain than oxycodone , so will switch outpatient opiate from oxy to IR morphine . Advised her to call her urologist's office to see if her lithotripsy could be moved up    CBC CMP UA sent from triage, reviewed   IV morphine , zofran  for symptomatic relief Will switch OP pain relief from oxy to morphine   Medical Complexity:  [] New and requires workup. [] New and does not require workup. [x] Pertinent labs & imaging results were reviewed by me and considered in my decision making. [] I obtained history from someone other than the patient. [x] I reviewed previous medical records. [] I independently visualized image(s), tracing(s), and/or specimen(s). [] I discussed the patient with another provider.  Medications Administered in the Emergency Department   lidocaine  (PF) (XYLOCAINE ) 1 % injection 0.5 mL (has no administration in time range) morphine  injection 6 mg (6 mg Intravenous Given 07/28/24 2156) ondansetron  (PF) (ZOFRAN ) injection 4 mg (4 mg Intravenous Given 07/28/24 2226) ketorolac  (TORADOL ) injection 15 mg (15 mg Intravenous Given 07/28/24 2256)    Results:  LABS: Recent Results (from the past 24 hours)  Comprehensive Metabolic Panel (CMP)   Collection Time: 07/28/24  4:50 PM  Result Value Ref Range   Sodium 137 135 - 145 mmol/L   Potassium 4.1 3.5 - 5.0 mmol/L   Chloride 105 98 - 108 mmol/L   Carbon Dioxide (CO2) 25 21 - 30 mmol/L   Urea Nitrogen (BUN) 10 7 - 20 mg/dL   Creatinine 0.6 0.4 - 1.0 mg/dL   Glucose 99 70 - 859 mg/dL   Calcium 9.4 8.7 - 89.7 mg/dL   AST (Aspartate Aminotransferase) 55 (H) 15 - 41 U/L   ALT (Alanine Aminotransferase) 113 (H) 10 - 39 U/L   Bilirubin, Total 0.5 0.4 - 1.5 mg/dL   Alk Phos (Alkaline Phosphatase) 88 24 - 110 U/L   Albumin 3.6 3.5 - 4.8 g/dL   Protein, Total 7.7 6.2 - 8.1 g/dL   Anion Gap 7 3 - 12 mmol/L   BUN/CREA Ratio 16 6 - 27   Glomerular Filtration Rate (eGFR)  125 mL/min/1.73sq m  Complete Blood  Count (CBC) with Differential   Collection Time: 07/28/24  4:50 PM  Result Value Ref Range   WBC (White Blood Cell Count) 10.3 (H) 3.2 - 9.8 x109/L   Hemoglobin 13.2 11.7 - 15.5 g/dL   Hematocrit 58.5 64.9 - 45.0 %   Platelets 337 150 - 450 x109/L   MCV (Mean Corpuscular Volume)  86 80 - 98 fL   MCH (Mean Corpuscular Hemoglobin) 27.5 26.5 - 34.0 pg   MCHC (Mean Corpuscular Hemoglobin Concentration) 31.9 31.0 - 36.0 %   RBC (Red Blood Cell Count) 4.80 3.77 - 5.16 x1012/L   RDW-CV (Red Cell Distribution Width) 13.1 11.5 - 14.5 %   NRBC (Nucleated Red Blood Cell Count) 0.00 0 x109/L   NRBC % (Nucleated Red Blood Cell %) 0.0 %   MPV (Mean Platelet Volume) 8.4 7.2 - 11.7 fL   Neutrophil Count 7.6 2.0 - 8.6 x109/L   Neutrophil % 73.5 37 - 80 %   Lymphocyte Count 1.9 0.6 - 4.2 x109/L   Lymphocyte % 18.3 10 - 50 %   Monocyte Count 0.6 0 - 0.9 x109/L   Monocyte % 5.7 0 - 12 %   Eosinophil Count 0.19 0 - 0.70 x109/L   Eosinophil % 1.9 0 - 7 %   Basophil Count 0.03 0 - 0.20 x109/L   Basophil % 0.3 0 - 2 %   Immature Granulocyte Count 0.03 <=0.06 x109/L   Immature Granulocyte % 0.3 <=0.7 %  HCG Blood Quantitative, Pregnancy Test   Collection Time: 07/28/24  4:50 PM  Result Value Ref Range   HCG Quant, Serum Pregnancy <5 mIU/mL  Culture, Urine, Routine with Pyuria Screen (reflexed from Urinalysis)   Collection Time: 07/28/24  8:10 PM  Result Value Ref Range   See Comment     Color Yellow Colorless, Straw, Light Yellow, Yellow, Dark Yellow   Clarity Cloudy (!) Clear   Specific Gravity 1.024 1.005 - 1.030   pH, Urine 5.5 5.0 - 8.0   Protein, Urinalysis Trace (!) Negative   Glucose, Urinalysis Negative Negative   Ketones, Urinalysis Negative Negative   Blood, Urinalysis 2+ (!) Negative   Nitrite, Urinalysis Negative Negative   Leukocytes, Urinalysis 3+ (!) Negative   Bilirubin, Urinalysis Negative Negative   Urobilinogen, Urinalysis 0.2 0.2 - 1.0 mg/dL   Red Blood Cells,  Urinalysis 41 (H) <=3 /hpf   WBC, UA 79 (H) <=5 /hpf   Squamous Epithelial Cells, Urinalysis 38 /hpf   Hyaline Casts 0 /lpf    IMAGING: Notified that Radiology will read radiologic studies and patient will be called if change in report by radiology department. No orders to display         ED Clinical Impression  1. Renal colic      ED Disposition  Discharge   Discharge Medications    Medication List    START taking these medications    morphine  15 MG immediate release tablet; Commonly known as: MSIR; Take 1  tablet (15 mg total) by mouth every 6 (six) hours as needed for Pain for  up to 5 days   ASK your doctor about these medications    blood glucose diagnostic test strip; 1 each (1 strip total) 3 (three)  times daily Use as instructed.  blood glucose meter kit; as directed  cephalexin  500 MG capsule; Commonly known as: KEFLEX   escitalopram oxalate 20 MG tablet; Commonly known as: LEXAPRO; 1 po  daily for anxiety  lancing device with lancets kit; Use 1 each 3 (three) times daily Use as  instructed.  lisinopriL  20 MG tablet; Commonly known as: ZESTRIL ; Take 1 tablet (20  mg total) by mouth once daily  metFORMIN  500 MG XR tablet; Commonly known as: GLUCOPHAGE -XR; Take 2  tablets (1,000 mg total) by mouth daily with dinner Take 1 tab twice daily  for 1 to 2  weeks, then increase to 2 tabs twice daily if no side effects.  omeprazole  20 MG DR capsule; Commonly known as: PriLOSEC   ondansetron  8 MG disintegrating tablet; Commonly known as: ZOFRAN -ODT;  Take 1 tablet (8 mg total) by mouth every 8 (eight) hours as needed for  Nausea for up to 10 days  oxyBUTYnin  5 mg tablet; Commonly known as: DITROPAN   polyethylene glycol powder; Commonly known as: MIRALAX ; Take 17 g by  mouth once daily for 14 days Mix in 4-8ounces of fluid prior to taking.  tamsulosin  0.4 mg capsule; Commonly known as: FLOMAX   * tirzepatide 10 mg/0.5 mL pen injector; Commonly known as:  MOUNJARO;  Inject 0.5 mLs (10 mg total) subcutaneously once a week  * MOUNJARO 12.5 mg/0.5 mL pen injector; Generic drug: tirzepatide;  INJECT 1/2 (ONE-HALF) ML SUBCUTANEOUSLY  ONCE A WEEK  * tirzepatide 15 mg/0.5 mL pen injector; Commonly known as: MOUNJARO;  Inject 0.5 mLs (15 mg total) subcutaneously once a week * This list has 3 medication(s) that are the same as other medications  prescribed for you. Read the directions carefully, and ask your doctor or  other care provider to review them with you.     Follow-up  [facility] [address] [place] [address] [phone]   For follow up  [facility] Emergency Department [address] [place]  [id number] [phone]  As needed, If symptoms worsen    Referrals  Procedures   Ambulatory Referral to Urology    This note was partially written using Dragon (a voice recognition system) and may contain typographical errors missed during proofreading. TIP (no need to delete)  Click Refresh button to add wrap-up information to note.     Arno Priestly, MD 07/28/24 [phone]  "

## 2024-07-28 NOTE — ED Triage Notes (Signed)
 Patient ambulates into triage with CC of kidney stones on both kidneys- patient has a stent in left kidney form 12/20 at [facility]. Patient endorsing dysuria. Patient does not believe she's passed any of the stones. Per paperwork, patient has a 2mm, 3mm and  10mm non-obsturctive stone on left kidney as well as two 6mm non-obsturcting stones on right side. Patient is endorsing lots of pain 10/10 and unable to tolerate until she's to be seen on 08/15/2023.   Patient on keflex  at this time and endorsing vaginal yeast infection

## 2024-08-01 ENCOUNTER — Other Ambulatory Visit

## 2024-08-01 ENCOUNTER — Ambulatory Visit: Admitting: Physician Assistant

## 2024-08-01 VITALS — BP 144/103 | HR 109 | Ht 63.0 in | Wt 259.0 lb

## 2024-08-01 DIAGNOSIS — N201 Calculus of ureter: Secondary | ICD-10-CM | POA: Diagnosis not present

## 2024-08-01 MED ORDER — TAMSULOSIN HCL 0.4 MG PO CAPS: 0.4000 mg | ORAL_CAPSULE | Freq: Two times a day (BID) | ORAL | Status: AC

## 2024-08-01 MED ORDER — OXYBUTYNIN CHLORIDE ER 15 MG PO TB24: 15.0000 mg | ORAL_TABLET | Freq: Every day | ORAL | 0 refills | Status: DC

## 2024-08-01 MED ORDER — KETOROLAC TROMETHAMINE 60 MG/2ML IM SOLN
15.0000 mg | Freq: Once | INTRAMUSCULAR | Status: AC
  Administered 2024-08-01: 15 mg via INTRAMUSCULAR

## 2024-08-01 NOTE — H&P (View-Only) (Signed)
 "  08/01/2024 11:12 AM   Lori Henry 1996/05/19 [id number]  CC: Chief Complaint  Patient presents with   Pre-op Exam   HPI: Lori Henry is a 28 y.o. female with PMH diabetes and recurrent UTI with a recent history of multiple proximal left ureteral stones with concern for UTI who underwent left ureteral stent placement while admitted on 07/22/2024 who presents today for evaluation of ongoing pain.   She is scheduled for left ureteroscopy with laser lithotripsy and stent exchange with Dr. Francisca on 08/14/2024.  Her urine culture from her recent admission finalized with no growth.  Today she reports severe LLQ pain that can be rather intermittent.  She has been taking primarily narcotics, which are not helping.  She is taking Flomax  once daily and will take oxybutynin  occasionally.  She has had multiple ED visits due to pain.  CTAP without contrast on 07/27/2024 showed proper positioning of her left ureteral stent.  She is on semaglutide, dosed most recently on Thursday, 07/27/2024.  In-office UA today positive for 1+ protein, 3+ blood, and 1+ leukocyte; urine microscopy with 11-30 WBCs/HPF, >30 RBCs/HPF, >10 epithelial cells/hpf, and many bacteria.  PMH: Past Medical History:  Diagnosis Date   Anxiety    Depression    Diabetes mellitus without complication (HCC)    metformin , mounjaro   History of migraine headaches    Hypertension    Kidney infection    Morbid obesity (HCC)    Renal stone 07/22/2024   Seizures (HCC)    Febrile seizures as a child and undiagnosed seizure on 01/2015    Surgical History: Past Surgical History:  Procedure Laterality Date   CESAREAN SECTION  12/11/2021   Procedure: CESAREAN SECTION;  Surgeon: Connell Davies, MD;  Location: [facility] ORS;  Service: Obstetrics;;   CHOLECYSTECTOMY N/A 10/23/2016   Procedure: LAPAROSCOPIC CHOLECYSTECTOMY WITH INTRAOPERATIVE CHOLANGIOGRAM;  Surgeon: Larinda Unknown Sharps, MD;  Location: [facility] ORS;  Service:  General;  Laterality: N/A;   CYSTOSCOPY W/ URETERAL STENT PLACEMENT Left 07/22/2024   Procedure: CYSTOSCOPY, WITH RETROGRADE PYELOGRAM AND URETERAL STENT INSERTION;  Surgeon: Selma Donnice SAUNDERS, MD;  Location: [facility] ORS;  Service: Urology;  Laterality: Left;   MOUTH BIOPSY     benign   MOUTH SURGERY     WISDOM TOOTH EXTRACTION      Home Medications:  Allergies as of 08/01/2024       Reactions   Bee Venom Anaphylaxis, Itching   Other Reaction(s): eye redness, facial swelling        Medication List        Accurate as of August 01, 2024 11:12 AM. If you have any questions, ask your nurse or doctor.          FLUoxetine  40 MG capsule Commonly known as: PROZAC  Take 40 mg by mouth daily.   lisinopril  20 MG tablet Commonly known as: ZESTRIL  Take 20 mg by mouth daily.   metFORMIN  500 MG 24 hr tablet Commonly known as: GLUCOPHAGE -XR Take 2 tablets (1,000 mg total) by mouth daily with breakfast.   Mounjaro 15 MG/0.5ML Pen Generic drug: tirzepatide Inject 15 mg into the skin once a week.   naproxen  500 MG EC tablet Commonly known as: EC NAPROSYN  Take 1 tablet (500 mg total) by mouth 2 (two) times daily with a meal.   norethindrone  0.35 MG tablet Commonly known as: Ortho Micronor  Take 1 tablet (0.35 mg total) by mouth daily.   nystatin  cream Commonly known as: MYCOSTATIN  To reddened skin under skin  folds twice a day   omeprazole  20 MG capsule Commonly known as: PRILOSEC  Take 20 mg by mouth daily.   oxybutynin  5 MG tablet Commonly known as: DITROPAN  Take 1 tablet (5 mg total) by mouth every 8 (eight) hours as needed for bladder spasms.   polyethylene glycol 17 g packet Commonly known as: MIRALAX  / GLYCOLAX  Take 17 g by mouth daily as needed for mild constipation.   prenatal multivitamin Tabs tablet Take 1 tablet by mouth daily at 12 noon.   tamsulosin  0.4 MG Caps capsule Commonly known as: FLOMAX  Take 1 capsule (0.4 mg total) by mouth daily.   traMADol  50 MG  tablet Commonly known as: Ultram  Take 1 tablet (50 mg total) by mouth every 6 (six) hours as needed for up to 5 days for moderate pain (pain score 4-6) or severe pain (pain score 7-10).        Allergies:  Allergies[1]  Family History: Family History  Problem Relation Age of Onset   Hyperlipidemia Mother    Diabetes Mother    Hypertension Mother    Diabetes Father     Social History:   reports that she quit smoking about 3 years ago. Her smoking use included cigarettes. She started smoking about 8 years ago. She has a 1.3 pack-year smoking history. She has never used smokeless tobacco. She reports that she does not drink alcohol and does not use drugs.  Physical Exam: BP (!) 144/103 (BP Location: Left Arm, Patient Position: Sitting, Cuff Size: Large)   Pulse (!) 109   Ht 5' 3 (1.6 m)   Wt 259 lb (117.5 kg)   SpO2 97%   BMI 45.88 kg/m   Constitutional:  Alert and oriented, no acute distress, nontoxic appearing HEENT: Bowlegs, AT Cardiovascular: No clubbing, cyanosis, or edema Respiratory: Normal respiratory effort, no increased work of breathing Skin: No rashes, bruises or suspicious lesions Neurologic: Grossly intact, no focal deficits, moving all 4 extremities Psychiatric: Normal mood and affect  Laboratory Data: Results for orders placed or performed in visit on 08/01/24  Microscopic Examination   Collection Time: 08/01/24 10:59 AM   Urine  Result Value Ref Range   WBC, UA 11-30 (A) 0 - 5 /hpf   RBC, Urine >30 (A) 0 - 2 /hpf   Epithelial Cells (non renal) >10 (A) 0 - 10 /hpf   Mucus, UA Present (A) Not Estab.   Bacteria, UA Many (A) None seen/Few  Urinalysis, Complete   Collection Time: 08/01/24 10:59 AM  Result Value Ref Range   Specific Gravity, UA 1.030 1.005 - 1.030   pH, UA 6.0 5.0 - 7.5   Color, UA Yellow Yellow   Appearance Ur Slightly cloudy Clear   Leukocytes,UA 1+ (A) Negative   Protein,UA 1+ (A) Negative/Trace   Glucose, UA Negative Negative    Ketones, UA Negative Negative   RBC, UA 3+ (A) Negative   Bilirubin, UA Negative Negative   Urobilinogen, Ur 0.2 0.2 - 1.0 mg/dL   Nitrite, UA Negative Negative   Microscopic Examination See below:    Assessment & Plan:   1. Left ureteral stone (Primary) Poorly controlled stent pain.  UA is contaminated and otherwise consistent with her known stent, though I did advise her to complete Keflex  as prescribed out of an abundance of caution.  Will increase Flomax  to twice daily and switch her oxybutynin  to XL 15 mg daily.  We also gave her 15 mg of Toradol  in clinic for pain control.  We discussed the  limited efficacy of narcotic pain medications for stone/stent pain.  There was a cancellation with Dr. Marval OR schedule and I offered to move her surgery up a week.  She is in agreement with this plan.  I advised her to hold her semaglutide this week.  She expressed understanding. - Urinalysis, Complete - CULTURE, URINE COMPREHENSIVE - tamsulosin  (FLOMAX ) 0.4 MG CAPS capsule; Take 1 capsule (0.4 mg total) by mouth in the morning and at bedtime. - oxybutynin  (DITROPAN  XL) 15 MG 24 hr tablet; Take 1 tablet (15 mg total) by mouth daily for 10 days.  Dispense: 10 tablet; Refill: 0 - ketorolac  (TORADOL ) injection 15 mg   Return in 6 days (on 08/07/2024) for Left ureteroscopy with laser lithotripsy and stent exchange with Dr. Francisca.  Lucie Hones, PA-C  [facility] [place] [address] [place], [place] [address] [phone]      [1]  Allergies Allergen Reactions   Bee Venom Anaphylaxis and Itching    Other Reaction(s): eye redness, facial swelling   "

## 2024-08-01 NOTE — Patient Instructions (Signed)
 Increase Flomax  (tamsulosin ) to twice daily Switch from oxybutynin  5 mg every 8 hours to oxybutynin  XL 15 mg once daily (prescription sent to your pharmacy today) Complete Keflex  (cephalexin ) Try Ultram  (tramadol ) for breakthrough pain Do not take Mounjaro this week Call the scheduling hotline Friday to find out your arrival time for surgery on Monday, 08/07/2024

## 2024-08-01 NOTE — Progress Notes (Unsigned)
 "  08/01/2024 11:12 AM   Lori Henry 1996-02-04 [id number]  CC: Chief Complaint  Patient presents with   Pre-op Exam   HPI: Lori Henry is a 28 y.o. female with PMH diabetes and recurrent UTI with a recent history of multiple proximal left ureteral stones with concern for UTI who underwent left ureteral stent placement while admitted on 07/22/2024 who presents today for evaluation of ongoing pain.   She is scheduled for left ureteroscopy with laser lithotripsy and stent exchange with Dr. Francisca on 08/14/2024.  Her urine culture from her recent admission finalized with no growth.  Today she reports severe LLQ pain that can be rather intermittent.  She has been taking primarily narcotics, which are not helping.  She is taking Flomax  once daily and will take oxybutynin  occasionally.  She has had multiple ED visits due to pain.  CTAP without contrast on 07/27/2024 showed proper positioning of her left ureteral stent.  She is on semaglutide, dosed most recently on Thursday, 07/27/2024.  In-office UA today positive for 1+ protein, 3+ blood, and 1+ leukocyte; urine microscopy with 11-30 WBCs/HPF, >30 RBCs/HPF, >10 epithelial cells/hpf, and many bacteria.  PMH: Past Medical History:  Diagnosis Date   Anxiety    Depression    Diabetes mellitus without complication (HCC)    metformin , mounjaro   History of migraine headaches    Hypertension    Kidney infection    Morbid obesity (HCC)    Renal stone 07/22/2024   Seizures (HCC)    Febrile seizures as a child and undiagnosed seizure on 01/2015    Surgical History: Past Surgical History:  Procedure Laterality Date   CESAREAN SECTION  12/11/2021   Procedure: CESAREAN SECTION;  Surgeon: Connell Davies, MD;  Location: [facility] ORS;  Service: Obstetrics;;   CHOLECYSTECTOMY N/A 10/23/2016   Procedure: LAPAROSCOPIC CHOLECYSTECTOMY WITH INTRAOPERATIVE CHOLANGIOGRAM;  Surgeon: Larinda Unknown Sharps, MD;  Location: [facility] ORS;  Service:  General;  Laterality: N/A;   CYSTOSCOPY W/ URETERAL STENT PLACEMENT Left 07/22/2024   Procedure: CYSTOSCOPY, WITH RETROGRADE PYELOGRAM AND URETERAL STENT INSERTION;  Surgeon: Selma Donnice SAUNDERS, MD;  Location: [facility] ORS;  Service: Urology;  Laterality: Left;   MOUTH BIOPSY     benign   MOUTH SURGERY     WISDOM TOOTH EXTRACTION      Home Medications:  Allergies as of 08/01/2024       Reactions   Bee Venom Anaphylaxis, Itching   Other Reaction(s): eye redness, facial swelling        Medication List        Accurate as of August 01, 2024 11:12 AM. If you have any questions, ask your nurse or doctor.          FLUoxetine  40 MG capsule Commonly known as: PROZAC  Take 40 mg by mouth daily.   lisinopril  20 MG tablet Commonly known as: ZESTRIL  Take 20 mg by mouth daily.   metFORMIN  500 MG 24 hr tablet Commonly known as: GLUCOPHAGE -XR Take 2 tablets (1,000 mg total) by mouth daily with breakfast.   Mounjaro 15 MG/0.5ML Pen Generic drug: tirzepatide Inject 15 mg into the skin once a week.   naproxen  500 MG EC tablet Commonly known as: EC NAPROSYN  Take 1 tablet (500 mg total) by mouth 2 (two) times daily with a meal.   norethindrone  0.35 MG tablet Commonly known as: Ortho Micronor  Take 1 tablet (0.35 mg total) by mouth daily.   nystatin  cream Commonly known as: MYCOSTATIN  To reddened skin under skin  folds twice a day   omeprazole  20 MG capsule Commonly known as: PRILOSEC  Take 20 mg by mouth daily.   oxybutynin  5 MG tablet Commonly known as: DITROPAN  Take 1 tablet (5 mg total) by mouth every 8 (eight) hours as needed for bladder spasms.   polyethylene glycol 17 g packet Commonly known as: MIRALAX  / GLYCOLAX  Take 17 g by mouth daily as needed for mild constipation.   prenatal multivitamin Tabs tablet Take 1 tablet by mouth daily at 12 noon.   tamsulosin  0.4 MG Caps capsule Commonly known as: FLOMAX  Take 1 capsule (0.4 mg total) by mouth daily.   traMADol  50 MG  tablet Commonly known as: Ultram  Take 1 tablet (50 mg total) by mouth every 6 (six) hours as needed for up to 5 days for moderate pain (pain score 4-6) or severe pain (pain score 7-10).        Allergies:  Allergies[1]  Family History: Family History  Problem Relation Age of Onset   Hyperlipidemia Mother    Diabetes Mother    Hypertension Mother    Diabetes Father     Social History:   reports that she quit smoking about 3 years ago. Her smoking use included cigarettes. She started smoking about 8 years ago. She has a 1.3 pack-year smoking history. She has never used smokeless tobacco. She reports that she does not drink alcohol and does not use drugs.  Physical Exam: BP (!) 144/103 (BP Location: Left Arm, Patient Position: Sitting, Cuff Size: Large)   Pulse (!) 109   Ht 5' 3 (1.6 m)   Wt 259 lb (117.5 kg)   SpO2 97%   BMI 45.88 kg/m   Constitutional:  Alert and oriented, no acute distress, nontoxic appearing HEENT: [place], AT Cardiovascular: No clubbing, cyanosis, or edema Respiratory: Normal respiratory effort, no increased work of breathing Skin: No rashes, bruises or suspicious lesions Neurologic: Grossly intact, no focal deficits, moving all 4 extremities Psychiatric: Normal mood and affect  Laboratory Data: Results for orders placed or performed in visit on 08/01/24  Microscopic Examination   Collection Time: 08/01/24 10:59 AM   Urine  Result Value Ref Range   WBC, UA 11-30 (A) 0 - 5 /hpf   RBC, Urine >30 (A) 0 - 2 /hpf   Epithelial Cells (non renal) >10 (A) 0 - 10 /hpf   Mucus, UA Present (A) Not Estab.   Bacteria, UA Many (A) None seen/Few  Urinalysis, Complete   Collection Time: 08/01/24 10:59 AM  Result Value Ref Range   Specific Gravity, UA 1.030 1.005 - 1.030   pH, UA 6.0 5.0 - 7.5   Color, UA Yellow Yellow   Appearance Ur Slightly cloudy Clear   Leukocytes,UA 1+ (A) Negative   Protein,UA 1+ (A) Negative/Trace   Glucose, UA Negative Negative    Ketones, UA Negative Negative   RBC, UA 3+ (A) Negative   Bilirubin, UA Negative Negative   Urobilinogen, Ur 0.2 0.2 - 1.0 mg/dL   Nitrite, UA Negative Negative   Microscopic Examination See below:    Assessment & Plan:   1. Left ureteral stone (Primary) Poorly controlled stent pain.  UA is contaminated and otherwise consistent with her known stent, though I did advise her to complete Keflex  as prescribed out of an abundance of caution.  Will increase Flomax  to twice daily and switch her oxybutynin  to XL 15 mg daily.  We also gave her 15 mg of Toradol  in clinic for pain control.  We discussed the  limited efficacy of narcotic pain medications for stone/stent pain.  There was a cancellation with Dr. Marval OR schedule and I offered to move her surgery up a week.  She is in agreement with this plan.  I advised her to hold her semaglutide this week.  She expressed understanding. - Urinalysis, Complete - CULTURE, URINE COMPREHENSIVE - tamsulosin  (FLOMAX ) 0.4 MG CAPS capsule; Take 1 capsule (0.4 mg total) by mouth in the morning and at bedtime. - oxybutynin  (DITROPAN  XL) 15 MG 24 hr tablet; Take 1 tablet (15 mg total) by mouth daily for 10 days.  Dispense: 10 tablet; Refill: 0 - ketorolac  (TORADOL ) injection 15 mg   Return in 6 days (on 08/07/2024) for Left ureteroscopy with laser lithotripsy and stent exchange with Dr. Francisca.  Lucie Hones, PA-C  [facility] [place] [address], [address] [place], [place] [address] [phone]      [1]  Allergies Allergen Reactions   Bee Venom Anaphylaxis and Itching    Other Reaction(s): eye redness, facial swelling   "

## 2024-08-02 ENCOUNTER — Inpatient Hospital Stay: Admission: RE | Admit: 2024-08-02 | Discharge: 2024-08-02 | Disposition: A | Source: Ambulatory Visit

## 2024-08-02 DIAGNOSIS — I10 Essential (primary) hypertension: Secondary | ICD-10-CM

## 2024-08-02 DIAGNOSIS — E119 Type 2 diabetes mellitus without complications: Secondary | ICD-10-CM

## 2024-08-02 DIAGNOSIS — Z01812 Encounter for preprocedural laboratory examination: Secondary | ICD-10-CM

## 2024-08-02 DIAGNOSIS — Z01810 Encounter for preprocedural cardiovascular examination: Secondary | ICD-10-CM

## 2024-08-02 LAB — URINALYSIS, COMPLETE
Bilirubin, UA: NEGATIVE
Glucose, UA: NEGATIVE
Ketones, UA: NEGATIVE
Nitrite, UA: NEGATIVE
Specific Gravity, UA: 1.03 (ref 1.005–1.030)
Urobilinogen, Ur: 0.2 mg/dL (ref 0.2–1.0)
pH, UA: 6 (ref 5.0–7.5)

## 2024-08-02 LAB — MICROSCOPIC EXAMINATION
Epithelial Cells (non renal): >10 /HPF — AB (ref 0–10)
RBC, Urine: >30 /HPF — AB (ref 0–2)

## 2024-08-02 NOTE — Pre-Procedure Instructions (Signed)
 PAT appointment scheduled for 12/31, unable to reach patient, called mobile number, home number and spouses number, unable to leave Vm and/or numbers will not go through. Surgery is scheduled for 08/07/24. Eleanor Silversmith RN at [facility] made aware that I can't contact patient.

## 2024-08-02 NOTE — Patient Instructions (Signed)
 Your procedure is scheduled on: 08/07/24 - Monday Report to the Registration Desk on the 1st floor of the [facility]. To find out your arrival time, please call [phone] between 1PM - 3PM on: 08/04/24 - Friday If your arrival time is 6:00 am, do not arrive before that time as the [facility] entrance doors do not open until 6:00 am.  REMEMBER: Instructions that are not followed completely may result in serious medical risk, up to and including death; or upon the discretion of your surgeon and anesthesiologist your surgery may need to be rescheduled.  Do not eat food or drink any liquids  after midnight the night before surgery.  No gum chewing or hard candies.  One week prior to surgery: Stop Anti-inflammatories (NSAIDS) such as Advil , Aleve , Ibuprofen , Motrin , Naproxen , Naprosyn  and Aspirin  based products such as Excedrin, Goody's Powder, BC Powder. You may take Tylenol  if needed for pain up until the day of surgery.  Stop ANY OVER THE COUNTER supplements until after surgery.  metFORMIN  (GLUCOPHAGE - hold beginning 01/03.  HOLD MOUNJARO 7 days prior to surgery.  ON THE DAY OF SURGERY ONLY TAKE THESE MEDICATIONS WITH SIPS OF WATER:  FLUoxetine  (PROZAC )  omeprazole  (PRILOSEC )  oxybutynin  (DITROPAN  XL)  tamsulosin  (FLOMAX )    No Alcohol for 24 hours before or after surgery.  No Smoking including e-cigarettes for 24 hours before surgery.  No chewable tobacco products for at least 6 hours before surgery.  No nicotine patches on the day of surgery.  Do not use any recreational drugs for at least a week (preferably 2 weeks) before your surgery.  Please be advised that the combination of cocaine and anesthesia may have negative outcomes, up to and including death. If you test positive for cocaine, your surgery will be cancelled.  On the morning of surgery brush your teeth with toothpaste and water, you may rinse your mouth with mouthwash if you wish. Do not swallow any  toothpaste or mouthwash.  Do not wear jewelry, make-up, hairpins, clips or nail polish.  For welded (permanent) jewelry: bracelets, anklets, waist bands, etc.  Please have this removed prior to surgery.  If it is not removed, there is a chance that hospital personnel will need to cut it off on the day of surgery.  Do not wear lotions, powders, or perfumes.   Do not shave body hair from the neck down 48 hours before surgery.  Contact lenses, hearing aids and dentures may not be worn into surgery.  Do not bring valuables to the hospital. [facility] is not responsible for any missing/lost belongings or valuables.   Notify your doctor if there is any change in your medical condition (cold, fever, infection).  Wear comfortable clothing (specific to your surgery type) to the hospital.  After surgery, you can help prevent lung complications by doing breathing exercises.  Take deep breaths and cough every 1-2 hours. Your doctor may order a device called an Incentive Spirometer to help you take deep breaths.  If you are being admitted to the hospital overnight, leave your suitcase in the car. After surgery it may be brought to your room.  In case of increased patient census, it may be necessary for you, the patient, to continue your postoperative care in the Same Day Surgery department.  If you are being discharged the day of surgery, you will not be allowed to drive home. You will need a responsible individual to drive you home and stay with you for 24 hours after  surgery.   If you are taking public transportation, you will need to have a responsible individual with you.  Please call the Pre-admissions Testing Dept. at [phone] if you have any questions about these instructions.  Surgery Visitation Policy:  Patients having surgery or a procedure may have two visitors.  Children under the age of 40 must have an adult with them who is not the patient.  Inpatient Visitation:     Visiting hours are 7 a.m. to 8 p.m. Up to four visitors are allowed at one time in a patient room. The visitors may rotate out with other people during the day.  One visitor age 45 or older may stay with the patient overnight and must be in the room by 8 p.m.   Merchandiser, Retail to address health-related social needs:  [email or web address]

## 2024-08-06 LAB — CULTURE, URINE COMPREHENSIVE

## 2024-08-06 MED ORDER — CHLORHEXIDINE GLUCONATE 0.12 % MT SOLN
15.0000 mL | Freq: Once | OROMUCOSAL | Status: AC
  Administered 2024-08-07: 15 mL via OROMUCOSAL

## 2024-08-06 MED ORDER — SODIUM CHLORIDE 0.9 % IV SOLN: INTRAVENOUS | Status: DC

## 2024-08-06 MED ORDER — ORAL CARE MOUTH RINSE: 15.0000 mL | Freq: Once | OROMUCOSAL | Status: AC

## 2024-08-06 MED ORDER — CEFAZOLIN SODIUM-DEXTROSE 2-4 GM/100ML-% IV SOLN
2.0000 g | INTRAVENOUS | Status: AC
  Administered 2024-08-07: 2 g via INTRAVENOUS

## 2024-08-07 ENCOUNTER — Ambulatory Visit: Admitting: Certified Registered"

## 2024-08-07 ENCOUNTER — Encounter: Admission: RE | Disposition: A | Payer: Self-pay | Source: Home / Self Care | Attending: Urology

## 2024-08-07 ENCOUNTER — Encounter: Payer: Self-pay | Admitting: Urology

## 2024-08-07 ENCOUNTER — Inpatient Hospital Stay: Admission: RE | Admit: 2024-08-07 | Source: Ambulatory Visit

## 2024-08-07 ENCOUNTER — Ambulatory Visit

## 2024-08-07 ENCOUNTER — Other Ambulatory Visit: Payer: Self-pay

## 2024-08-07 ENCOUNTER — Ambulatory Visit
Admission: RE | Admit: 2024-08-07 | Discharge: 2024-08-07 | Disposition: A | Discharge status: Home / Self Care | Attending: Urology | Admitting: Urology

## 2024-08-07 DIAGNOSIS — Z01810 Encounter for preprocedural cardiovascular examination: Secondary | ICD-10-CM

## 2024-08-07 DIAGNOSIS — E66813 Obesity, class 3: Secondary | ICD-10-CM | POA: Insufficient documentation

## 2024-08-07 DIAGNOSIS — N202 Calculus of kidney with calculus of ureter: Secondary | ICD-10-CM | POA: Insufficient documentation

## 2024-08-07 DIAGNOSIS — E119 Type 2 diabetes mellitus without complications: Secondary | ICD-10-CM | POA: Insufficient documentation

## 2024-08-07 DIAGNOSIS — I10 Essential (primary) hypertension: Secondary | ICD-10-CM | POA: Insufficient documentation

## 2024-08-07 DIAGNOSIS — Z01812 Encounter for preprocedural laboratory examination: Secondary | ICD-10-CM

## 2024-08-07 DIAGNOSIS — Z6841 Body Mass Index (BMI) 40.0 and over, adult: Secondary | ICD-10-CM | POA: Insufficient documentation

## 2024-08-07 DIAGNOSIS — Z87440 Personal history of urinary (tract) infections: Secondary | ICD-10-CM | POA: Insufficient documentation

## 2024-08-07 DIAGNOSIS — Z87891 Personal history of nicotine dependence: Secondary | ICD-10-CM | POA: Insufficient documentation

## 2024-08-07 DIAGNOSIS — N201 Calculus of ureter: Secondary | ICD-10-CM

## 2024-08-07 HISTORY — PX: CYSTOSCOPY/URETEROSCOPY/HOLMIUM LASER/STENT PLACEMENT: SHX6546

## 2024-08-07 LAB — GLUCOSE, CAPILLARY
Glucose-Capillary: 100 mg/dL — ABNORMAL HIGH (ref 70–99)
Glucose-Capillary: 104 mg/dL — ABNORMAL HIGH (ref 70–99)

## 2024-08-07 LAB — POCT PREGNANCY, URINE: Preg Test, Ur: NEGATIVE

## 2024-08-07 SURGERY — CYSTOSCOPY/URETEROSCOPY/HOLMIUM LASER/STENT PLACEMENT
Anesthesia: General | Site: Ureter | Laterality: Left

## 2024-08-07 MED ORDER — LIDOCAINE HCL (CARDIAC) PF 100 MG/5ML IV SOSY
PREFILLED_SYRINGE | INTRAVENOUS | Status: DC | PRN
  Administered 2024-08-07: 100 mg via INTRAVENOUS

## 2024-08-07 MED ORDER — IOHEXOL 180 MG/ML  SOLN
INTRAMUSCULAR | Status: DC | PRN
  Administered 2024-08-07: 10 mL

## 2024-08-07 MED ORDER — PROPOFOL 10 MG/ML IV BOLUS
INTRAVENOUS | Status: DC | PRN
  Administered 2024-08-07: 200 mg via INTRAVENOUS

## 2024-08-07 MED ORDER — ONDANSETRON HCL 4 MG/2ML IJ SOLN
INTRAMUSCULAR | Status: DC | PRN
  Administered 2024-08-07 (×2): 4 mg via INTRAVENOUS

## 2024-08-07 MED ORDER — MIDAZOLAM HCL (PF) 2 MG/2ML IJ SOLN
INTRAMUSCULAR | Status: DC | PRN
  Administered 2024-08-07: 2 mg via INTRAVENOUS

## 2024-08-07 MED ORDER — CIPROFLOXACIN IN D5W 400 MG/200ML IV SOLN
INTRAVENOUS | Status: DC | PRN
  Administered 2024-08-07: 400 mg via INTRAVENOUS

## 2024-08-07 MED ORDER — OXYCODONE HCL 5 MG PO TABS
ORAL_TABLET | ORAL | Status: AC
  Filled 2024-08-07: qty 1

## 2024-08-07 MED ORDER — CHLORHEXIDINE GLUCONATE 0.12 % MT SOLN
OROMUCOSAL | Status: AC
  Filled 2024-08-07: qty 15

## 2024-08-07 MED ORDER — FENTANYL CITRATE (PF) 100 MCG/2ML IJ SOLN: 25.0000 ug | INTRAMUSCULAR | Status: DC | PRN

## 2024-08-07 MED ORDER — NITROFURANTOIN MACROCRYSTAL 100 MG PO CAPS: 100.0000 mg | ORAL_CAPSULE | Freq: Two times a day (BID) | ORAL | 0 refills | Status: DC

## 2024-08-07 MED ORDER — SUCCINYLCHOLINE CHLORIDE 200 MG/10ML IV SOSY
PREFILLED_SYRINGE | INTRAVENOUS | Status: DC | PRN
  Administered 2024-08-07: 140 mg via INTRAVENOUS

## 2024-08-07 MED ORDER — ROCURONIUM BROMIDE 100 MG/10ML IV SOLN
INTRAVENOUS | Status: DC | PRN
  Administered 2024-08-07: 50 mg via INTRAVENOUS
  Administered 2024-08-07 (×2): 10 mg via INTRAVENOUS

## 2024-08-07 MED ORDER — STERILE WATER FOR IRRIGATION IR SOLN
Status: DC | PRN
  Administered 2024-08-07: 500 mL

## 2024-08-07 MED ORDER — FENTANYL CITRATE (PF) 100 MCG/2ML IJ SOLN
INTRAMUSCULAR | Status: DC | PRN
  Administered 2024-08-07 (×2): 50 ug via INTRAVENOUS

## 2024-08-07 MED ORDER — OXYCODONE HCL 5 MG PO TABS
5.0000 mg | ORAL_TABLET | Freq: Once | ORAL | Status: AC | PRN
  Administered 2024-08-07: 5 mg via ORAL

## 2024-08-07 MED ORDER — FENTANYL CITRATE (PF) 100 MCG/2ML IJ SOLN
INTRAMUSCULAR | Status: AC
  Filled 2024-08-07: qty 2

## 2024-08-07 MED ORDER — ACETAMINOPHEN 10 MG/ML IV SOLN
INTRAVENOUS | Status: DC | PRN
  Administered 2024-08-07: 1000 mg via INTRAVENOUS

## 2024-08-07 MED ORDER — KETOROLAC TROMETHAMINE 10 MG PO TABS: 10.0000 mg | ORAL_TABLET | Freq: Four times a day (QID) | ORAL | 0 refills | Status: DC | PRN

## 2024-08-07 MED ORDER — SUGAMMADEX SODIUM 200 MG/2ML IV SOLN
INTRAVENOUS | Status: DC | PRN
  Administered 2024-08-07: 300 mg via INTRAVENOUS

## 2024-08-07 MED ORDER — GLYCOPYRROLATE 0.2 MG/ML IJ SOLN
INTRAMUSCULAR | Status: DC | PRN
  Administered 2024-08-07: .2 mg via INTRAVENOUS

## 2024-08-07 MED ORDER — DEXMEDETOMIDINE HCL IN NACL 200 MCG/50ML IV SOLN
INTRAVENOUS | Status: DC | PRN
  Administered 2024-08-07 (×2): 20 ug via INTRAVENOUS

## 2024-08-07 MED ORDER — OXYCODONE HCL 5 MG/5ML PO SOLN: 5.0000 mg | Freq: Once | ORAL | Status: AC | PRN

## 2024-08-07 MED ORDER — MIDAZOLAM HCL 2 MG/2ML IJ SOLN
INTRAMUSCULAR | Status: AC
  Filled 2024-08-07: qty 2

## 2024-08-07 MED ORDER — CIPROFLOXACIN IN D5W 400 MG/200ML IV SOLN
INTRAVENOUS | Status: AC
  Filled 2024-08-07: qty 200

## 2024-08-07 MED ORDER — SODIUM CHLORIDE 0.9 % IR SOLN
Status: DC | PRN
  Administered 2024-08-07: 3000 mL

## 2024-08-07 MED ORDER — ESMOLOL HCL 100 MG/10ML IV SOLN
INTRAVENOUS | Status: DC | PRN
  Administered 2024-08-07: 20 mg via INTRAVENOUS

## 2024-08-07 MED ORDER — CEFAZOLIN SODIUM-DEXTROSE 2-4 GM/100ML-% IV SOLN
INTRAVENOUS | Status: AC
  Filled 2024-08-07: qty 100

## 2024-08-07 SURGICAL SUPPLY — 22 items
ADHESIVE MASTISOL STRL (MISCELLANEOUS) IMPLANT
BAG DRAIN SIEMENS DORNER NS (MISCELLANEOUS) ×1 IMPLANT
BRUSH SCRUB EZ 4% CHG (MISCELLANEOUS) ×1 IMPLANT
CATH URET FLEX-TIP 2 LUMEN 10F (CATHETERS) IMPLANT
CATH URETL OPEN 5X70 (CATHETERS) IMPLANT
DRAPE UTILITY 15X26 TOWEL STRL (DRAPES) ×1 IMPLANT
DRSG TEGADERM 2-3/8X2-3/4 SM (GAUZE/BANDAGES/DRESSINGS) IMPLANT
FIBER LASER MOSES 365 DFL (Laser) IMPLANT
GLOVE BIOGEL PI IND STRL 7.5 (GLOVE) ×1 IMPLANT
GOWN STRL REUS W/ TWL LRG LVL3 (GOWN DISPOSABLE) ×1 IMPLANT
GOWN STRL REUS W/ TWL XL LVL3 (GOWN DISPOSABLE) ×1 IMPLANT
GUIDEWIRE STR DUAL SENSOR (WIRE) ×1 IMPLANT
KIT TURNOVER CYSTO (KITS) ×1 IMPLANT
PACK CYSTO AR (MISCELLANEOUS) ×1 IMPLANT
SHEATH NAVIGATOR HD 12/14X36 (SHEATH) IMPLANT
SOL .9 NS 3000ML IRR UROMATIC (IV SOLUTION) ×1 IMPLANT
SOLN STERILE WATER 500 ML (IV SOLUTION) ×1 IMPLANT
STENT URET 6FRX24 CONTOUR (STENTS) IMPLANT
STENT URET 6FRX26 CONTOUR (STENTS) IMPLANT
SURGILUBE 2OZ TUBE FLIPTOP (MISCELLANEOUS) ×1 IMPLANT
SYR 10ML LL (SYRINGE) ×1 IMPLANT
VALVE UROSEAL ADJ ENDO (VALVE) IMPLANT

## 2024-08-07 NOTE — Anesthesia Postprocedure Evaluation (Signed)
"   Anesthesia Post Note  Patient: Lori Henry  Procedure(s) Performed: CYSTOSCOPY/URETEROSCOPY/HOLMIUM LASER/STENT PLACEMENT (Left: Ureter)  Anesthetic complications: no   No notable events documented.   Last Vitals:  Vitals:   08/07/24 1345 08/07/24 1400  BP: 96/66 99/64  Pulse: 88 93  Resp: 18 16  Temp:    SpO2: 100% 96%    Last Pain:  Vitals:   08/07/24 1400  PainSc: 0-No pain                 Redell MARLA Breaker      "

## 2024-08-07 NOTE — Anesthesia Postprocedure Evaluation (Signed)
"   Anesthesia Post Note  Patient: Lori Henry  Procedure(s) Performed: CYSTOSCOPY/URETEROSCOPY/HOLMIUM LASER/STENT PLACEMENT (Left: Ureter)  Anesthesia Type: General Level of consciousness: awake and alert Pain management: pain level controlled Vital Signs Assessment: post-procedure vital signs reviewed and stable Respiratory status: spontaneous breathing Cardiovascular status: blood pressure returned to baseline Postop Assessment: adequate PO intake Anesthetic complications: no   No notable events documented.   Last Vitals:  Vitals:   08/07/24 1345 08/07/24 1400  BP: 96/66 99/64  Pulse: 88 93  Resp: 18 16  Temp:    SpO2: 100% 96%    Last Pain:  Vitals:   08/07/24 1400  PainSc: 0-No pain                 Redell MARLA Breaker      "

## 2024-08-07 NOTE — Anesthesia Procedure Notes (Addendum)
 Procedure Name: Intubation Date/Time: 08/07/2024 12:40 PM  Performed by: Ledora Duncan, CRNAPre-anesthesia Checklist: Patient identified, Emergency Drugs available, Suction available and Patient being monitored Patient Re-evaluated:Patient Re-evaluated prior to induction Oxygen Delivery Method: Circle system utilized Preoxygenation: Pre-oxygenation with 100% oxygen Induction Type: IV induction Ventilation: Mask ventilation without difficulty Laryngoscope Size: McGrath and 3 Grade View: Grade I Tube type: Oral Tube size: 7.0 mm Number of attempts: 1 Airway Equipment and Method: Stylet Placement Confirmation: ETT inserted through vocal cords under direct vision, positive ETCO2 and breath sounds checked- equal and bilateral Secured at: 21 cm Tube secured with: Tape Dental Injury: Teeth and Oropharynx as per pre-operative assessment

## 2024-08-07 NOTE — Interval H&P Note (Signed)
 UROLOGY H&P UPDATE  Agree with prior H&P dated 08/01/2024, multiple left ureteral and renal stones previously stented for concern for infection.  Here today for definitive management with ureteroscopy.  Cardiac: RRR Lungs: CTA bilaterally  Laterality: Left Procedure: Left ureteroscopy, laser lithotripsy, stent change  Urine: Urine culture 12/26 mixed flora  We specifically discussed the risks ureteroscopy including bleeding, infection/sepsis, stent related symptoms including flank pain/urgency/frequency/incontinence/dysuria, ureteral injury, ureteral stricture, inability to access stone, or need for staged or additional procedures.   Lori JAYSON Burnet, MD 08/07/2024

## 2024-08-07 NOTE — Anesthesia Preprocedure Evaluation (Addendum)
"                                    Anesthesia Evaluation  Patient identified by MRN, date of birth, ID band Patient awake    Reviewed: Allergy & Precautions, H&P , NPO status , Patient's Chart, lab work & pertinent test results  Airway Mallampati: III  TM Distance: >3 FB Neck ROM: Full    Dental no notable dental hx. (+) Teeth Intact   Pulmonary neg pulmonary ROS, former smoker   Pulmonary exam normal breath sounds clear to auscultation       Cardiovascular hypertension, Pt. on medications negative cardio ROS Normal cardiovascular exam Rhythm:Regular Rate:Normal     Neuro/Psych Seizures -, Well Controlled,  PSYCHIATRIC DISORDERS Anxiety Depression    negative neurological ROS  negative psych ROS   GI/Hepatic negative GI ROS, Neg liver ROS,,,  Endo/Other  negative endocrine ROSdiabetes, Well Controlled, Type 1  Class 3 obesity  Renal/GU Renal diseasenegative Renal ROS     Musculoskeletal   Abdominal  (+) + obese Abdomen: soft.   Peds  Hematology   Anesthesia Other Findings   Reproductive/Obstetrics negative OB ROS                              Anesthesia Physical Anesthesia Plan  ASA: 3  Anesthesia Plan: General   Post-op Pain Management:    Induction:   PONV Risk Score and Plan: 2 and Ondansetron  and Dexamethasone   Airway Management Planned: Oral ETT  Additional Equipment:   Intra-op Plan:   Post-operative Plan: Extubation in OR  Informed Consent: I have reviewed the patients History and Physical, chart, labs and discussed the procedure including the risks, benefits and alternatives for the proposed anesthesia with the patient or authorized representative who has indicated his/her understanding and acceptance.     Dental advisory given and History available from chart only  Plan Discussed with: CRNA  Anesthesia Plan Comments:          Anesthesia Quick Evaluation  "

## 2024-08-07 NOTE — Transfer of Care (Signed)
 Immediate Anesthesia Transfer of Care Note  Patient: Lori Henry  Procedure(s) Performed: CYSTOSCOPY/URETEROSCOPY/HOLMIUM LASER/STENT PLACEMENT (Left: Ureter)  Patient Location: PACU  Anesthesia Type:General  Level of Consciousness: drowsy and patient cooperative  Airway & Oxygen Therapy: Patient Spontanous Breathing and Patient connected to face mask oxygen  Post-op Assessment: Report given to RN and Post -op Vital signs reviewed and stable  Post vital signs: Reviewed and stable  Last Vitals:  Vitals Value Taken Time  BP 95/60 08/07/24 13:30  Temp 36.3 C 08/07/24 13:28  Pulse 91 08/07/24 13:31  Resp 19 08/07/24 13:31  SpO2 100 % 08/07/24 13:31  Vitals shown include unfiled device data.  Last Pain:  Vitals:   08/07/24 1330  PainSc: Asleep         Complications: No notable events documented.

## 2024-08-07 NOTE — Addendum Note (Signed)
 Addendum  created 08/07/24 1415 by [place] K, MD   Clinical Note Signed

## 2024-08-07 NOTE — Op Note (Signed)
 Date of procedure: 08/07/2024  Preoperative diagnosis:  Left proximal ureteral stones Left renal stones  Postoperative diagnosis:  Same  Procedure: Cystoscopy, left retrograde pyelogram with intraoperative interpretation, left ureteral stent placement Left ureteroscopy, laser lithotripsy of proximal ureteral stones Left ureteroscopy, laser lithotripsy of renal stones  Surgeon: Redell Burnet, MD  Anesthesia: General  Complications: None  Intraoperative findings:  Normal bladder, uncomplicated dusting of left proximal ureteral stones and multiple left renal stones Stent placed with Dangler  EBL: Minimal  Specimens: None  Drains: Left 6 French by 24 cm ureteral stent  Indication: Lori Henry is a 29 y.o. patient previously stented for left-sided proximal ureteral stones and concern for infection, culture was ultimately negative.  She has not tolerated the stent well, here today for definitive management with ureteroscopy.  After reviewing the management options for treatment, they elected to proceed with the above surgical procedure(s). We have discussed the potential benefits and risks of the procedure, side effects of the proposed treatment, the likelihood of the patient achieving the goals of the procedure, and any potential problems that might occur during the procedure or recuperation. Informed consent has been obtained.  Description of procedure:  The patient was taken to the operating room and general anesthesia was induced. SCDs were placed for DVT prophylaxis. The patient was placed in the dorsal lithotomy position, prepped and draped in the usual sterile fashion, and preoperative antibiotics(Cipro ) were administered. A preoperative time-out was performed.   A 21 French rigid cystoscope was used to intubate the urethra and thorough cystoscopy was performed.  The bladder was grossly normal, and ureteral orifices were orthotopic bilaterally.  A sensor wire was advanced  alongside the left-sided ureteral stent up to the kidney under fluoroscopic vision.  The old stent was removed.  An 78/10 French dual-lumen ureteral access catheter was advanced over the wire to the proximal ureter, and a second safety sensor wire was added.  A 12/14 French ureteral access sheath was then gently advanced over the wire to below the level of the proximal ureteral stones.  A digital single-channel flexible ureteroscope was advanced to the sheath and 3 stones were identified in the proximal ureter.  A 365 m laser fiber on settings of 1.0 J and 10 Hz was used to methodically dust the stones.  I then advanced the flexible ureteroscope into the kidney and thorough pyeloscopy revealed a large 1 cm stone in the upper pole, and smaller stones in the lower pole.  The 365 m laser fiber on settings of 0.5 J and 80 Hz was used to methodically fragment and dust all stones, no fragments appeared to be larger than the laser fiber after conclusion.  A  retrograde pyelogram for the proximal ureter showed no extravasation or filling defects.  The sheath was removed and careful pullback ureteroscopy showed no evidence of ureteral injury or residual fragments.  A 6 French by 24 cm ureteral stent was placed fluoroscopically with a curl in the renal pelvis, as well as in the bladder.  The bladder was drained and Dangler secured to the suprapubic region with Mastisol and Tegaderm.  Disposition: Stable to PACU  Plan: Can remove stent at home on Friday morning, nitrofurantoin  twice daily x 5 days Recommend PA follow-up 2 to 3 months for 24-hour urine metabolic workup  Redell Burnet, MD

## 2024-08-08 ENCOUNTER — Other Ambulatory Visit: Payer: Self-pay

## 2024-08-08 ENCOUNTER — Emergency Department

## 2024-08-08 ENCOUNTER — Encounter: Payer: Self-pay | Admitting: Urology

## 2024-08-08 ENCOUNTER — Inpatient Hospital Stay
Admission: EM | Admit: 2024-08-08 | Discharge: 2024-08-12 | DRG: 660 | Disposition: A | Discharge status: Home / Self Care | Attending: Internal Medicine | Admitting: Internal Medicine

## 2024-08-08 DIAGNOSIS — R52 Pain, unspecified: Secondary | ICD-10-CM | POA: Diagnosis present

## 2024-08-08 DIAGNOSIS — Z87442 Personal history of urinary calculi: Secondary | ICD-10-CM

## 2024-08-08 DIAGNOSIS — F4310 Post-traumatic stress disorder, unspecified: Secondary | ICD-10-CM | POA: Diagnosis present

## 2024-08-08 DIAGNOSIS — R32 Unspecified urinary incontinence: Secondary | ICD-10-CM | POA: Diagnosis present

## 2024-08-08 DIAGNOSIS — E66813 Obesity, class 3: Secondary | ICD-10-CM | POA: Diagnosis present

## 2024-08-08 DIAGNOSIS — Z87892 Personal history of anaphylaxis: Secondary | ICD-10-CM

## 2024-08-08 DIAGNOSIS — Z6841 Body Mass Index (BMI) 40.0 and over, adult: Secondary | ICD-10-CM

## 2024-08-08 DIAGNOSIS — K760 Fatty (change of) liver, not elsewhere classified: Secondary | ICD-10-CM | POA: Diagnosis present

## 2024-08-08 DIAGNOSIS — Z833 Family history of diabetes mellitus: Secondary | ICD-10-CM

## 2024-08-08 DIAGNOSIS — Z7985 Long-term (current) use of injectable non-insulin antidiabetic drugs: Secondary | ICD-10-CM

## 2024-08-08 DIAGNOSIS — Z83438 Family history of other disorder of lipoprotein metabolism and other lipidemia: Secondary | ICD-10-CM

## 2024-08-08 DIAGNOSIS — Z79899 Other long term (current) drug therapy: Secondary | ICD-10-CM

## 2024-08-08 DIAGNOSIS — F329 Major depressive disorder, single episode, unspecified: Secondary | ICD-10-CM | POA: Diagnosis present

## 2024-08-08 DIAGNOSIS — G8918 Other acute postprocedural pain: Principal | ICD-10-CM

## 2024-08-08 DIAGNOSIS — N200 Calculus of kidney: Secondary | ICD-10-CM | POA: Diagnosis present

## 2024-08-08 DIAGNOSIS — Z8249 Family history of ischemic heart disease and other diseases of the circulatory system: Secondary | ICD-10-CM

## 2024-08-08 DIAGNOSIS — Z87891 Personal history of nicotine dependence: Secondary | ICD-10-CM

## 2024-08-08 DIAGNOSIS — Z7984 Long term (current) use of oral hypoglycemic drugs: Secondary | ICD-10-CM

## 2024-08-08 DIAGNOSIS — Z91030 Bee allergy status: Secondary | ICD-10-CM

## 2024-08-08 DIAGNOSIS — F32A Depression, unspecified: Secondary | ICD-10-CM | POA: Diagnosis present

## 2024-08-08 DIAGNOSIS — I10 Essential (primary) hypertension: Secondary | ICD-10-CM | POA: Diagnosis present

## 2024-08-08 DIAGNOSIS — F419 Anxiety disorder, unspecified: Secondary | ICD-10-CM | POA: Diagnosis present

## 2024-08-08 DIAGNOSIS — Z87440 Personal history of urinary (tract) infections: Secondary | ICD-10-CM

## 2024-08-08 DIAGNOSIS — Y732 Prosthetic and other implants, materials and accessory gastroenterology and urology devices associated with adverse incidents: Secondary | ICD-10-CM | POA: Diagnosis present

## 2024-08-08 DIAGNOSIS — T83122A Displacement of urinary stent, initial encounter: Principal | ICD-10-CM | POA: Diagnosis present

## 2024-08-08 DIAGNOSIS — T8384XA Pain from genitourinary prosthetic devices, implants and grafts, initial encounter: Secondary | ICD-10-CM | POA: Diagnosis present

## 2024-08-08 DIAGNOSIS — E119 Type 2 diabetes mellitus without complications: Secondary | ICD-10-CM | POA: Diagnosis present

## 2024-08-08 DIAGNOSIS — N390 Urinary tract infection, site not specified: Secondary | ICD-10-CM | POA: Diagnosis present

## 2024-08-08 LAB — MAGNESIUM: Magnesium: 1.9 mg/dL (ref 1.7–2.4)

## 2024-08-08 MED ORDER — ACETAMINOPHEN 325 MG PO TABS: 650.0000 mg | ORAL_TABLET | Freq: Four times a day (QID) | ORAL | Status: DC | PRN

## 2024-08-08 MED ORDER — KETOROLAC TROMETHAMINE 30 MG/ML IJ SOLN
30.0000 mg | Freq: Once | INTRAMUSCULAR | Status: AC
  Administered 2024-08-08: 30 mg via INTRAVENOUS
  Filled 2024-08-08: qty 1

## 2024-08-08 MED ORDER — KETOROLAC TROMETHAMINE 15 MG/ML IJ SOLN
15.0000 mg | Freq: Three times a day (TID) | INTRAMUSCULAR | Status: DC
  Administered 2024-08-09 (×2): 15 mg via INTRAVENOUS
  Filled 2024-08-08 (×2): qty 1

## 2024-08-08 MED ORDER — ONDANSETRON HCL 4 MG PO TABS
4.0000 mg | ORAL_TABLET | Freq: Four times a day (QID) | ORAL | Status: DC | PRN
  Administered 2024-08-10: 4 mg via ORAL
  Filled 2024-08-08: qty 1

## 2024-08-08 MED ORDER — HYDROMORPHONE HCL 1 MG/ML IJ SOLN
1.0000 mg | Freq: Once | INTRAMUSCULAR | Status: AC
  Administered 2024-08-08: 1 mg via INTRAVENOUS
  Filled 2024-08-08: qty 1

## 2024-08-08 MED ORDER — ONDANSETRON HCL 4 MG/2ML IJ SOLN
4.0000 mg | Freq: Once | INTRAMUSCULAR | Status: AC | PRN
  Administered 2024-08-08: 4 mg via INTRAVENOUS
  Filled 2024-08-08: qty 2

## 2024-08-08 MED ORDER — SODIUM CHLORIDE 0.9% FLUSH
3.0000 mL | Freq: Two times a day (BID) | INTRAVENOUS | Status: DC
  Administered 2024-08-08 – 2024-08-12 (×8): 3 mL via INTRAVENOUS

## 2024-08-08 MED ORDER — ACETAMINOPHEN 500 MG PO TABS
1000.0000 mg | ORAL_TABLET | Freq: Four times a day (QID) | ORAL | Status: DC
  Administered 2024-08-08 – 2024-08-11 (×8): 1000 mg via ORAL
  Filled 2024-08-08 (×9): qty 2

## 2024-08-08 MED ORDER — HYDROMORPHONE HCL 1 MG/ML IJ SOLN
1.0000 mg | INTRAMUSCULAR | Status: DC | PRN
  Administered 2024-08-08: 1 mg via INTRAVENOUS
  Filled 2024-08-08: qty 1

## 2024-08-08 MED ORDER — ACETAMINOPHEN 650 MG RE SUPP: 650.0000 mg | Freq: Four times a day (QID) | RECTAL | Status: DC | PRN

## 2024-08-08 MED ORDER — PHENOL 1.4 % MT LIQD
1.0000 | OROMUCOSAL | Status: DC | PRN
  Administered 2024-08-08: 1 via OROMUCOSAL
  Filled 2024-08-08: qty 177

## 2024-08-08 MED ORDER — ONDANSETRON HCL 4 MG/2ML IJ SOLN
4.0000 mg | Freq: Four times a day (QID) | INTRAMUSCULAR | Status: DC | PRN
  Administered 2024-08-08 – 2024-08-12 (×6): 4 mg via INTRAVENOUS
  Filled 2024-08-08 (×6): qty 2

## 2024-08-08 MED ORDER — HEPARIN SODIUM (PORCINE) 5000 UNIT/ML IJ SOLN
5000.0000 [IU] | Freq: Three times a day (TID) | INTRAMUSCULAR | Status: DC
  Administered 2024-08-08 – 2024-08-12 (×11): 5000 [IU] via SUBCUTANEOUS
  Filled 2024-08-08 (×11): qty 1

## 2024-08-08 MED ORDER — SENNOSIDES-DOCUSATE SODIUM 8.6-50 MG PO TABS: 1.0000 | ORAL_TABLET | Freq: Every evening | ORAL | Status: DC | PRN

## 2024-08-08 MED ORDER — OXYCODONE HCL 5 MG PO TABS
5.0000 mg | ORAL_TABLET | ORAL | Status: DC | PRN
  Administered 2024-08-09 – 2024-08-10 (×4): 5 mg via ORAL
  Filled 2024-08-08 (×4): qty 1

## 2024-08-08 MED ORDER — HYDROMORPHONE HCL 1 MG/ML IJ SOLN
0.5000 mg | INTRAMUSCULAR | Status: DC | PRN
  Administered 2024-08-08 – 2024-08-09 (×4): 1 mg via INTRAVENOUS
  Administered 2024-08-09: 0.5 mg via INTRAVENOUS
  Administered 2024-08-09: 1 mg via INTRAVENOUS
  Filled 2024-08-08 (×6): qty 1

## 2024-08-08 MED ORDER — LACTATED RINGERS IV SOLN: INTRAVENOUS | Status: DC

## 2024-08-08 NOTE — ED Provider Notes (Signed)
 "  [facility] Provider Note   Event Date/Time   First MD Initiated Contact with Patient 08/08/24 1502     (approximate) History  Post-op Problem  HPI Lori Henry is a 29 y.o. female with a stated past medical history of renal stones status post stent placement yesterday with concerns for infection who presents complaining of partial dislodgment of this stent with significant pain and urinary incontinence.  Patient states that anytime she feels she needs to pee or when she stands up she is urinating on herself.  Patient states that she can see a small tube protruding from her vagina this morning.  Patient states that she did not pull on the wires of the stent but woke up finding that they had been partially pulled.  Patient has been using her home oral opiate medications that have only partially relieved her pain.  Patient currently states 10/10 pain with no exacerbating or relieving factors. ROS: Patient currently denies any vision changes, tinnitus, difficulty speaking, facial droop, sore throat, chest pain, shortness of breath, nausea/vomiting/diarrhea, dysuria, or weakness/numbness/paresthesias in any extremity   Physical Exam  Triage Vital Signs: ED Triage Vitals [08/08/24 1441]  Encounter Vitals Group     BP (!) 147/112     Girls Systolic BP Percentile      Girls Diastolic BP Percentile      Boys Systolic BP Percentile      Boys Diastolic BP Percentile      Pulse Rate 82     Resp 18     Temp 97.9 F (36.6 C)     Temp Source Oral     SpO2 98 %     Weight      Height      Head Circumference      Peak Flow      Pain Score 5     Pain Loc      Pain Education      Exclude from Growth Chart    Most recent vital signs: Vitals:   08/08/24 1839 08/08/24 2016  BP: 114/71 113/80  Pulse: 80 85  Resp: 16 20  Temp: 98.1 F (36.7 C) 98 F (36.7 C)  SpO2: 96% 95%   General: Awake, oriented x4. CV:  Good peripheral perfusion. Resp:  Normal  effort. Abd:  No distention. GU:  Normal external female genitalia with ureteral stent partially protruding from the urethra actively draining urine without blood Other:  Middle-aged obese Caucasian female resting uncomfortably in stretcher. ED Results / Procedures / Treatments  Labs (all labs ordered are listed, but only abnormal results are displayed) Labs Reviewed  MAGNESIUM   URINALYSIS, ROUTINE W REFLEX MICROSCOPIC  BASIC METABOLIC PANEL WITH GFR  CBC   RADIOLOGY ED MD interpretation: Single portable view of x-ray shows left ureteral stent with proximal tip in the region of the distal left ureter and inferior portion projecting Outside finding the peritoneum - All radiology independently interpreted and agree with radiology assessment Official radiology report(s): DG Abd Portable 1 View Result Date: 08/08/2024 EXAM: 1 VIEW XRAY OF THE ABDOMEN 08/08/2024 03:52:00 PM COMPARISON: None available. CLINICAL HISTORY: ureteral stent partially removed FINDINGS: LINES, TUBES AND DEVICES: Left ureteral stent in place. Proximal catheter tip projects over the pelvis, likely in the region of the mid/distal left ureter. The inferior portion of the catheter projects midline outside of the body near the perineum. BOWEL: No evidence for bowel obstruction. SOFT TISSUES: Cholecystectomy clips are present. BONES: No acute fracture. IMPRESSION: 1.  Left ureteral stent in place, with the proximal tip likely in the region of the mid to distal left ureter and the inferior portion projecting midline outside of the body near the perineum. Electronically signed by: Greig Pique MD 08/08/2024 04:32 PM EST RP Workstation: [id number]   PROCEDURES: Critical Care performed: No Procedures MEDICATIONS ORDERED IN ED: Medications  sodium chloride  flush (NS) 0.9 % injection 3 mL (3 mLs Intravenous Given 08/08/24 2102)  senna-docusate (Senokot-S) tablet 1 tablet (has no administration in time range)  heparin  injection 5,000  Units (5,000 Units Subcutaneous Given 08/08/24 2120)  lactated ringers  infusion ( Intravenous New Bag/Given 08/08/24 1923)  oxyCODONE  (Oxy IR/ROXICODONE ) immediate release tablet 5 mg (has no administration in time range)  HYDROmorphone  (DILAUDID ) injection 0.5-1 mg (1 mg Intravenous Given 08/08/24 2101)  ondansetron  (ZOFRAN ) tablet 4 mg ( Oral See Alternative 08/08/24 2119)    Or  ondansetron  (ZOFRAN ) injection 4 mg (4 mg Intravenous Given 08/08/24 2119)  acetaminophen  (TYLENOL ) tablet 1,000 mg (1,000 mg Oral Given 08/08/24 2120)  ketorolac  (TORADOL ) 15 MG/ML injection 15 mg (has no administration in time range)  phenol (CHLORASEPTIC) mouth spray 1 spray (1 spray Mouth/Throat Given 08/08/24 2208)  HYDROmorphone  (DILAUDID ) injection 1 mg (1 mg Intravenous Given 08/08/24 1554)  ondansetron  (ZOFRAN ) injection 4 mg (4 mg Intravenous Given 08/08/24 1553)  ketorolac  (TORADOL ) 30 MG/ML injection 30 mg (30 mg Intravenous Given 08/08/24 1719)  HYDROmorphone  (DILAUDID ) injection 1 mg (1 mg Intravenous Given 08/08/24 1718)   IMPRESSION / MDM / ASSESSMENT AND PLAN / ED COURSE  I reviewed the triage vital signs and the nursing notes.                             The patient is on the cardiac monitor to evaluate for evidence of arrhythmia and/or significant heart rate changes. Patient's presentation is most consistent with acute presentation with potential threat to life or bodily function. Patient is a 29 year old female with the above-stated past medical history who presents complaining of of pain after partial dislodgment of her ureteral stent.  Patient states that she has been using her home narcotic pain medicines to limited effect. Consults: Urology-recommends pulling the rest of this catheter out however monitoring for pain control as the subsequent edema from removing the stents likely causes severe pain for 24 hours.  Patient required multiple doses of IV opiate medication and therefore will require admission to the  internal medicine service for pain control  Admit to medicine   FINAL CLINICAL IMPRESSION(S) / ED DIAGNOSES   Final diagnoses:  Post-operative pain   Rx / DC Orders   ED Discharge Orders     None      Note:  This document was prepared using Dragon voice recognition software and may include unintentional dictation errors.   Dwayn Moravek K, MD 08/08/24 2310  "

## 2024-08-08 NOTE — ED Triage Notes (Signed)
 Pt to ED via POV from home. Pt reports had left ureter stent placed yesterday. Pt reports it came out last night during sleep. 10/10 pain at site and cramping

## 2024-08-08 NOTE — H&P (Signed)
 " History and Physical    Lori Henry FMW:[id number] DOB: 1996-07-11 DOA: 08/08/2024  DOS: the patient was seen and examined on 08/08/2024  PCP: System, Provider Not In   Patient coming from: Home  I have personally briefly reviewed patient's old medical records in [facility] and CareEverywhere  HPI:   Lori Henry is a 29 y.o. year old female with medical history of hypertension, class III obesity, nephrolithiasis status post stent placement presented to the ED with severe pain after having stent placed yesterday.  States initially she had polyuria and felt her stent dislodged and then had significant pain.  Pain was unbearable prompting her to come to the ED.  In the ED it was determined her stent has dislodged and EDP discussed with urologist who recommended removing the stent.  She denies any URI symptoms, denies any fevers or chills. On arrival to the ED patient was noted to be HDS stable.  Lab work and imaging are pending at time of admission.  Given uncontrolled pain and need for pain control, [facility] contacted for admission.  Review of Systems: As mentioned in the history of present illness. All other systems reviewed and are negative.   Past Medical History:  Diagnosis Date   Anxiety    Bilateral nephrolithiasis 2025   Bilateral nonobstructive nephrolithiasis   Depression    Diabetes mellitus without complication (HCC)    metformin , mounjaro   History of migraine headaches    Hypertension    Kidney infection    Morbid obesity (HCC)    Renal stone 07/22/2024   Seizures (HCC)    Febrile seizures as a child and undiagnosed seizure on 01/2015    Past Surgical History:  Procedure Laterality Date   CESAREAN SECTION  12/11/2021   Procedure: CESAREAN SECTION;  Surgeon: Connell Davies, MD;  Location: [facility] ORS;  Service: Obstetrics;;   CHOLECYSTECTOMY N/A 10/23/2016   Procedure: LAPAROSCOPIC CHOLECYSTECTOMY WITH INTRAOPERATIVE CHOLANGIOGRAM;  Surgeon: Larinda Unknown Sharps, MD;  Location: [facility] ORS;  Service: General;  Laterality: N/A;   CYSTOSCOPY W/ URETERAL STENT PLACEMENT Left 07/22/2024   Procedure: CYSTOSCOPY, WITH RETROGRADE PYELOGRAM AND URETERAL STENT INSERTION;  Surgeon: Selma Donnice SAUNDERS, MD;  Location: [facility] ORS;  Service: Urology;  Laterality: Left;   CYSTOSCOPY/URETEROSCOPY/HOLMIUM LASER/STENT PLACEMENT Left 08/07/2024   Procedure: CYSTOSCOPY/URETEROSCOPY/HOLMIUM LASER/STENT PLACEMENT;  Surgeon: Francisca Redell BROCKS, MD;  Location: [facility] ORS;  Service: Urology;  Laterality: Left;  EXCHANGE   MOUTH BIOPSY     benign   MOUTH SURGERY     WISDOM TOOTH EXTRACTION       Allergies[1]  Family History  Problem Relation Age of Onset   Hyperlipidemia Mother    Diabetes Mother    Hypertension Mother    Diabetes Father     Prior to Admission medications  Medication Sig Start Date End Date Taking? Authorizing Provider  escitalopram  (LEXAPRO ) 20 MG tablet Take 20 mg by mouth daily. 07/24/24   [provider]  fluconazole  (DIFLUCAN ) 150 MG tablet Take 150 mg by mouth daily.    [provider]  FLUoxetine  (PROZAC ) 40 MG capsule Take 40 mg by mouth daily.    [provider]  ketorolac  (TORADOL ) 10 MG tablet Take 1 tablet (10 mg total) by mouth every 6 (six) hours as needed for severe pain (pain score 7-10). 08/07/24   Francisca Redell BROCKS, MD  lisinopril  (ZESTRIL ) 20 MG tablet Take 20 mg by mouth daily.    [provider]  metFORMIN  (GLUCOPHAGE -XR) 500  MG 24 hr tablet Take 2 tablets (1,000 mg total) by mouth daily with breakfast. 07/25/24   Josette Ade, MD  morphine  (MSIR) 15 MG tablet Take 15 mg by mouth 4 (four) times daily as needed. 07/29/24   [provider]  MOUNJARO 15 MG/0.5ML Pen Inject 15 mg into the skin once a week.    [provider]  naproxen  (NAPROSYN ) 500 MG tablet Take 500 mg by mouth 2 (two) times daily. 07/30/24   [provider]  nitrofurantoin  (MACRODANTIN ) 100 MG capsule Take 1  capsule (100 mg total) by mouth 2 (two) times daily for 5 days. 08/07/24 08/12/24  Francisca Redell BROCKS, MD  norethindrone  (ORTHO MICRONOR ) 0.35 MG tablet Take 1 tablet (0.35 mg total) by mouth daily. 09/28/22   Veda Hummingbird, FNP  nystatin  cream (MYCOSTATIN ) To reddened skin under skin folds twice a day 07/23/24   Josette Ade, MD  omeprazole  (PRILOSEC ) 20 MG capsule Take 20 mg by mouth daily.    [provider]  ondansetron  (ZOFRAN -ODT) 8 MG disintegrating tablet Take 8 mg by mouth every 8 (eight) hours as needed. 07/24/24   [provider]  oxybutynin  (DITROPAN  XL) 15 MG 24 hr tablet Take 1 tablet (15 mg total) by mouth daily for 10 days. 08/01/24 08/11/24  Vaillancourt, Samantha, PA-C  polyethylene glycol (MIRALAX  / GLYCOLAX ) 17 g packet Take 17 g by mouth daily as needed for mild constipation. 07/23/24   Josette Ade, MD  tamsulosin  (FLOMAX ) 0.4 MG CAPS capsule Take 1 capsule (0.4 mg total) by mouth in the morning and at bedtime. 08/01/24   Maurine Lukes, PA-C    Social History:  reports that she quit smoking about 3 years ago. Her smoking use included cigarettes. She started smoking about 8 years ago. She has a 1.3 pack-year smoking history. She has never used smokeless tobacco. She reports that she does not drink alcohol and does not use drugs.    Physical Exam: Vitals:   08/08/24 1441 08/08/24 1504 08/08/24 1839 08/08/24 2016  BP: (!) 147/112  114/71 113/80  Pulse: 82  80 85  Resp: 18  16 20   Temp: 97.9 F (36.6 C)  98.1 F (36.7 C) 98 F (36.7 C)  TempSrc: Oral  Oral Oral  SpO2: 98%  96% 95%  Weight:  117 kg    Height:  5' 3 (1.6 m)      Gen: NAD HENT: NCAT CV: normal heart sounds Lung: CTAB Abd: No TTP, normal bowel sounds MSK: No asymmetry, good bulk and tone Neuro: alert and oriented   Labs on Admission: I have personally reviewed following labs and imaging studies  CBC: No results for input(s): WBC, NEUTROABS, HGB, HCT,  MCV, PLT in the last 168 hours. Basic Metabolic Panel: Recent Labs  Lab 08/08/24 2021  MG 1.9   GFR: Estimated Creatinine Clearance: 129.2 mL/min (by C-G formula based on SCr of 0.54 mg/dL). Liver Function Tests: No results for input(s): AST, ALT, ALKPHOS, BILITOT, PROT, ALBUMIN in the last 168 hours. No results for input(s): LIPASE, AMYLASE in the last 168 hours. No results for input(s): AMMONIA  in the last 168 hours. Coagulation Profile: No results for input(s): INR, PROTIME in the last 168 hours. Cardiac Enzymes: No results for input(s): CKTOTAL, CKMB, CKMBINDEX, TROPONINI, TROPONINIHS in the last 168 hours. BNP (last 3 results) No results for input(s): BNP in the last 8760 hours. HbA1C: No results for input(s): HGBA1C in the last 72 hours. CBG: Recent Labs  Lab 08/07/24 1222 08/07/24  1335  GLUCAP 100* 104*   Lipid Profile: No results for input(s): CHOL, HDL, LDLCALC, TRIG, CHOLHDL, LDLDIRECT in the last 72 hours. Thyroid Function Tests: No results for input(s): TSH, T4TOTAL, FREET4, T3FREE, THYROIDAB in the last 72 hours. Anemia Panel: No results for input(s): VITAMINB12, FOLATE, FERRITIN, TIBC, IRON, RETICCTPCT in the last 72 hours. Urine analysis:    Component Value Date/Time   COLORURINE YELLOW (A) 07/27/2024 1521   APPEARANCEUR Slightly cloudy 08/01/2024 1059   LABSPEC 1.024 07/27/2024 1521   LABSPEC 1.009 02/04/2012 2337   PHURINE 5.0 07/27/2024 1521   GLUCOSEU Negative 08/01/2024 1059   GLUCOSEU Negative 02/04/2012 2337   HGBUR MODERATE (A) 07/27/2024 1521   BILIRUBINUR Negative 08/01/2024 1059   BILIRUBINUR Negative 02/04/2012 2337   KETONESUR NEGATIVE 07/27/2024 1521   PROTEINUR 1+ (A) 08/01/2024 1059   PROTEINUR NEGATIVE 07/27/2024 1521   UROBILINOGEN negative (A) 12/09/2021 1627   NITRITE Negative 08/01/2024 1059   NITRITE NEGATIVE 07/27/2024 1521   LEUKOCYTESUR 1+ (A)  08/01/2024 1059   LEUKOCYTESUR MODERATE (A) 07/27/2024 1521   LEUKOCYTESUR Negative 02/04/2012 2337    Radiological Exams on Admission: I have personally reviewed images DG Abd Portable 1 View Result Date: 08/08/2024 EXAM: 1 VIEW XRAY OF THE ABDOMEN 08/08/2024 03:52:00 PM COMPARISON: None available. CLINICAL HISTORY: ureteral stent partially removed FINDINGS: LINES, TUBES AND DEVICES: Left ureteral stent in place. Proximal catheter tip projects over the pelvis, likely in the region of the mid/distal left ureter. The inferior portion of the catheter projects midline outside of the body near the perineum. BOWEL: No evidence for bowel obstruction. SOFT TISSUES: Cholecystectomy clips are present. BONES: No acute fracture. IMPRESSION: 1. Left ureteral stent in place, with the proximal tip likely in the region of the mid to distal left ureter and the inferior portion projecting midline outside of the body near the perineum. Electronically signed by: Greig Pique MD 08/08/2024 04:32 PM EST RP Workstation: [id number]   DG OR UROLOGY CYSTO IMAGE ([facility] ONLY) Result Date: 08/07/2024 There is no interpretation for this exam.  This order is for images obtained during a surgical procedure.  Please See Surgeries Tab for more information regarding the procedure.    Assessment/Plan Principal Problem:   Intractable pain Active Problems:   Nephrolithiasis   Depression   Essential hypertension   PTSD (post-traumatic stress disorder)   Morbid obesity with BMI of 45.0-49.9, adult [facility])   Patient with intractable pain secondary to ureteral stent dislodgment.  Stent has been removed and the patient states still has significant pain.  She is admitted for pain control.  Will start multimodal pain control. Started on oxycodone  5 mg q6hrs prn. IV Dilaudid  1 mg q2hrs for breakthrough pain. Scheduled tylenol  at 1000 mg q6hrs. Toradol  15 mg q8hrs for 5 doses. Normal renal function and no hx of PUD noted.   Hypertension:  Continue home lisinopril   MDD/PTSD: Continue home Lexapro  and Prozac  as this is her outpatient regimen.  Type 2 diabetes: Holding home metformin .  Patient is also on Mounjaro.  Placing patient on SSI.  Class III obesity: Discussed lifestyle modifications.  Discussed overall benefit to weight loss on her overall health.  She is on Mounjaro. VTE prophylaxis:  SQ Heparin   Diet: Regular Code Status:  Full Code Telemetry:  Admission status: Observation, Med-Surg Patient is from: Home Anticipated d/c is to: Home Anticipated d/c is in: 1-2 days   Family Communication: Updated at bedside  Consults called: Urology   Severity of Illness: The appropriate patient status  for this patient is OBSERVATION. Observation status is judged to be reasonable and necessary in order to provide the required intensity of service to ensure the patient's safety. The patient's presenting symptoms, physical exam findings, and initial radiographic and laboratory data in the context of their medical condition is felt to place them at decreased risk for further clinical deterioration. Furthermore, it is anticipated that the patient will be medically stable for discharge from the hospital within 2 midnights of admission.    Morene Bathe, MD Lori Henry. [facility]     [1]  Allergies Allergen Reactions   Bee Venom Anaphylaxis and Itching    Other Reaction(s): eye redness, facial swelling   "

## 2024-08-08 NOTE — ED Notes (Signed)
 Called CCMD to initiate cardiac monitoring.

## 2024-08-09 ENCOUNTER — Observation Stay

## 2024-08-09 DIAGNOSIS — F329 Major depressive disorder, single episode, unspecified: Secondary | ICD-10-CM | POA: Diagnosis present

## 2024-08-09 DIAGNOSIS — Z87892 Personal history of anaphylaxis: Secondary | ICD-10-CM | POA: Diagnosis not present

## 2024-08-09 DIAGNOSIS — R52 Pain, unspecified: Secondary | ICD-10-CM

## 2024-08-09 DIAGNOSIS — T8384XA Pain from genitourinary prosthetic devices, implants and grafts, initial encounter: Secondary | ICD-10-CM | POA: Diagnosis present

## 2024-08-09 DIAGNOSIS — F419 Anxiety disorder, unspecified: Secondary | ICD-10-CM | POA: Diagnosis present

## 2024-08-09 DIAGNOSIS — Z87440 Personal history of urinary (tract) infections: Secondary | ICD-10-CM | POA: Diagnosis not present

## 2024-08-09 DIAGNOSIS — T83122A Displacement of urinary stent, initial encounter: Secondary | ICD-10-CM

## 2024-08-09 DIAGNOSIS — N200 Calculus of kidney: Secondary | ICD-10-CM | POA: Diagnosis present

## 2024-08-09 DIAGNOSIS — Y732 Prosthetic and other implants, materials and accessory gastroenterology and urology devices associated with adverse incidents: Secondary | ICD-10-CM | POA: Diagnosis present

## 2024-08-09 DIAGNOSIS — Z87442 Personal history of urinary calculi: Secondary | ICD-10-CM | POA: Diagnosis not present

## 2024-08-09 DIAGNOSIS — E66813 Obesity, class 3: Secondary | ICD-10-CM | POA: Diagnosis present

## 2024-08-09 DIAGNOSIS — Z6841 Body Mass Index (BMI) 40.0 and over, adult: Secondary | ICD-10-CM | POA: Diagnosis not present

## 2024-08-09 DIAGNOSIS — Z833 Family history of diabetes mellitus: Secondary | ICD-10-CM | POA: Diagnosis not present

## 2024-08-09 DIAGNOSIS — G8918 Other acute postprocedural pain: Secondary | ICD-10-CM | POA: Diagnosis present

## 2024-08-09 DIAGNOSIS — Z79899 Other long term (current) drug therapy: Secondary | ICD-10-CM | POA: Diagnosis not present

## 2024-08-09 DIAGNOSIS — Z7985 Long-term (current) use of injectable non-insulin antidiabetic drugs: Secondary | ICD-10-CM | POA: Diagnosis not present

## 2024-08-09 DIAGNOSIS — F4310 Post-traumatic stress disorder, unspecified: Secondary | ICD-10-CM | POA: Diagnosis present

## 2024-08-09 DIAGNOSIS — Z7984 Long term (current) use of oral hypoglycemic drugs: Secondary | ICD-10-CM | POA: Diagnosis not present

## 2024-08-09 DIAGNOSIS — Z8249 Family history of ischemic heart disease and other diseases of the circulatory system: Secondary | ICD-10-CM | POA: Diagnosis not present

## 2024-08-09 DIAGNOSIS — E119 Type 2 diabetes mellitus without complications: Secondary | ICD-10-CM | POA: Diagnosis present

## 2024-08-09 DIAGNOSIS — R32 Unspecified urinary incontinence: Secondary | ICD-10-CM | POA: Diagnosis present

## 2024-08-09 DIAGNOSIS — N390 Urinary tract infection, site not specified: Secondary | ICD-10-CM | POA: Diagnosis present

## 2024-08-09 DIAGNOSIS — Z83438 Family history of other disorder of lipoprotein metabolism and other lipidemia: Secondary | ICD-10-CM | POA: Diagnosis not present

## 2024-08-09 DIAGNOSIS — Z91030 Bee allergy status: Secondary | ICD-10-CM | POA: Diagnosis not present

## 2024-08-09 DIAGNOSIS — K760 Fatty (change of) liver, not elsewhere classified: Secondary | ICD-10-CM | POA: Diagnosis present

## 2024-08-09 DIAGNOSIS — I10 Essential (primary) hypertension: Secondary | ICD-10-CM | POA: Diagnosis present

## 2024-08-09 DIAGNOSIS — Z87891 Personal history of nicotine dependence: Secondary | ICD-10-CM | POA: Diagnosis not present

## 2024-08-09 LAB — BASIC METABOLIC PANEL WITH GFR
Anion gap: 12 (ref 5–15)
BUN: 8 mg/dL (ref 6–20)
CO2: 24 mmol/L (ref 22–32)
Calcium: 8.8 mg/dL — ABNORMAL LOW (ref 8.9–10.3)
Chloride: 100 mmol/L (ref 98–111)
Creatinine, Ser: 0.74 mg/dL (ref 0.44–1.00)
GFR, Estimated: >60 mL/min
Glucose, Bld: 91 mg/dL (ref 70–99)
Potassium: 3.7 mmol/L (ref 3.5–5.1)
Sodium: 136 mmol/L (ref 135–145)

## 2024-08-09 LAB — URINALYSIS, ROUTINE W REFLEX MICROSCOPIC
Bilirubin Urine: NEGATIVE
Glucose, UA: NEGATIVE mg/dL
Ketones, ur: NEGATIVE mg/dL
Nitrite: NEGATIVE
Protein, ur: 100 mg/dL — AB
RBC / HPF: >50 RBC/hpf (ref 0–5)
Specific Gravity, Urine: 1.009 (ref 1.005–1.030)
WBC, UA: >50 WBC/hpf (ref 0–5)
pH: 6 (ref 5.0–8.0)

## 2024-08-09 LAB — CBC
HCT: 40 % (ref 36.0–46.0)
Hemoglobin: 12.8 g/dL (ref 12.0–15.0)
MCH: 28 pg (ref 26.0–34.0)
MCHC: 32 g/dL (ref 30.0–36.0)
MCV: 87.5 fL (ref 80.0–100.0)
Platelets: 341 K/uL (ref 150–400)
RBC: 4.57 MIL/uL (ref 3.87–5.11)
RDW: 12.4 % (ref 11.5–15.5)
WBC: 9.8 K/uL (ref 4.0–10.5)
nRBC: 0 % (ref 0.0–0.2)

## 2024-08-09 LAB — GLUCOSE, CAPILLARY
Glucose-Capillary: 93 mg/dL (ref 70–99)
Glucose-Capillary: 155 mg/dL — ABNORMAL HIGH (ref 70–99)
Glucose-Capillary: 136 mg/dL — ABNORMAL HIGH (ref 70–99)
Glucose-Capillary: 121 mg/dL — ABNORMAL HIGH (ref 70–99)
Glucose-Capillary: 99 mg/dL (ref 70–99)

## 2024-08-09 MED ORDER — SODIUM CHLORIDE 0.9 % IV SOLN
1.0000 g | INTRAVENOUS | Status: DC
  Administered 2024-08-09: 1 g via INTRAVENOUS
  Filled 2024-08-09: qty 10

## 2024-08-09 MED ORDER — INSULIN ASPART 100 UNIT/ML IJ SOLN
0.0000 [IU] | Freq: Three times a day (TID) | INTRAMUSCULAR | Status: DC
  Administered 2024-08-09: 1 [IU] via SUBCUTANEOUS
  Filled 2024-08-09: qty 1

## 2024-08-09 MED ORDER — HYDROMORPHONE HCL 1 MG/ML IJ SOLN
1.5000 mg | INTRAMUSCULAR | Status: DC | PRN
  Administered 2024-08-09 – 2024-08-10 (×5): 1.5 mg via INTRAVENOUS
  Filled 2024-08-09 (×5): qty 1.5

## 2024-08-09 MED ORDER — ESCITALOPRAM OXALATE 10 MG PO TABS: 20.0000 mg | ORAL_TABLET | Freq: Every day | ORAL | Status: DC

## 2024-08-09 MED ORDER — FLUOXETINE HCL 20 MG PO CAPS
40.0000 mg | ORAL_CAPSULE | Freq: Every day | ORAL | Status: DC
  Administered 2024-08-09 – 2024-08-12 (×4): 40 mg via ORAL
  Filled 2024-08-09 (×4): qty 2

## 2024-08-09 MED ORDER — TAMSULOSIN HCL 0.4 MG PO CAPS
0.4000 mg | ORAL_CAPSULE | Freq: Two times a day (BID) | ORAL | Status: DC
  Administered 2024-08-09 – 2024-08-12 (×7): 0.4 mg via ORAL
  Filled 2024-08-09 (×7): qty 1

## 2024-08-09 MED ORDER — PANTOPRAZOLE SODIUM 40 MG PO TBEC
40.0000 mg | DELAYED_RELEASE_TABLET | Freq: Every day | ORAL | Status: DC
  Administered 2024-08-09 – 2024-08-12 (×4): 40 mg via ORAL
  Filled 2024-08-09 (×4): qty 1

## 2024-08-09 MED ORDER — KETOROLAC TROMETHAMINE 15 MG/ML IJ SOLN: 15.0000 mg | Freq: Four times a day (QID) | INTRAMUSCULAR | Status: DC

## 2024-08-09 MED ORDER — ALPRAZOLAM 0.5 MG PO TABS
0.5000 mg | ORAL_TABLET | Freq: Three times a day (TID) | ORAL | Status: DC | PRN
  Administered 2024-08-09 – 2024-08-12 (×7): 0.5 mg via ORAL
  Filled 2024-08-09 (×7): qty 1

## 2024-08-09 MED ORDER — KETOROLAC TROMETHAMINE 15 MG/ML IJ SOLN
15.0000 mg | Freq: Three times a day (TID) | INTRAMUSCULAR | Status: DC
  Administered 2024-08-09 – 2024-08-10 (×3): 15 mg via INTRAVENOUS
  Filled 2024-08-09 (×3): qty 1

## 2024-08-09 MED ORDER — SODIUM CHLORIDE 0.9 % IV SOLN
2.0000 g | Freq: Four times a day (QID) | INTRAVENOUS | Status: AC
  Administered 2024-08-09 – 2024-08-12 (×11): 2 g via INTRAVENOUS
  Filled 2024-08-09 (×12): qty 2000

## 2024-08-09 MED ORDER — LORAZEPAM 2 MG/ML IJ SOLN
0.5000 mg | Freq: Once | INTRAMUSCULAR | Status: AC
  Administered 2024-08-09: 0.5 mg via INTRAVENOUS
  Filled 2024-08-09: qty 1

## 2024-08-09 MED ORDER — INSULIN ASPART 100 UNIT/ML IJ SOLN: 0.0000 [IU] | Freq: Every day | INTRAMUSCULAR | Status: DC

## 2024-08-09 MED ORDER — OXYBUTYNIN CHLORIDE ER 5 MG PO TB24
15.0000 mg | ORAL_TABLET | Freq: Every day | ORAL | Status: DC
  Administered 2024-08-09 – 2024-08-10 (×2): 15 mg via ORAL
  Filled 2024-08-09 (×3): qty 1

## 2024-08-09 NOTE — Consult Note (Signed)
 "   Urology Consult  I have been asked to see the patient by Dr. Fernand, for evaluation and management of dislodged ureteral stent.  Chief Complaint: Dislodged ureteral stent  History of Present Illness: Lori Henry is a 29 y.o. year old female with PMH diabetes and recurrent UTI who underwent left ureteroscopy with laser lithotripsy and stent exchange with Dr. Francisca 2 days ago who was admitted yesterday for intractable left renal colic after prematurely dislodging her stent.   Abdominal x-ray on presentation notable for a dislodged ureteral stent with the proximal tip in the mid to distal left ureter.  Her stent was subsequently removed in the emergency department.  Admission labs notable for UA with >50 RBC/hpf, >50 WBC/hpf, and few bacteria.  Urine culture pending, but she has been started on antibiotics as below as her preop culture grew low colony counts E faecalis.  White count WNL at 9.8.  Creatinine WNL at 0.74.  She has been afebrile and tachycardic this morning.  Pain regimen since admission has included Tylenol , Dilaudid , Toradol , oxybutynin , and Flomax .  She reports stable pain since arrival.  Her pain is responsive to Dilaudid  and improves it from 10/10 in intensity to 5/10 in intensity.  She has voided overnight and had significant dysuria with this.  She is concerned that her last void was about 12 hours ago.  She does not feel the urge to void.  Anti-infectives (From admission, onward)    Start     Dose/Rate Route Frequency Ordered Stop   08/09/24 0200  cefTRIAXone  (ROCEPHIN ) 1 g in sodium chloride  0.9 % 100 mL IVPB        1 g 200 mL/hr over 30 Minutes Intravenous Every 24 hours 08/09/24 0101          Past Medical History:  Diagnosis Date   Anxiety    Bilateral nephrolithiasis 2025   Bilateral nonobstructive nephrolithiasis   Depression    Diabetes mellitus without complication (HCC)    metformin , mounjaro   History of migraine headaches    Hypertension     Kidney infection    Morbid obesity (HCC)    Renal stone 07/22/2024   Seizures (HCC)    Febrile seizures as a child and undiagnosed seizure on 01/2015    Past Surgical History:  Procedure Laterality Date   CESAREAN SECTION  12/11/2021   Procedure: CESAREAN SECTION;  Surgeon: Connell Davies, MD;  Location: [facility] ORS;  Service: Obstetrics;;   CHOLECYSTECTOMY N/A 10/23/2016   Procedure: LAPAROSCOPIC CHOLECYSTECTOMY WITH INTRAOPERATIVE CHOLANGIOGRAM;  Surgeon: Larinda Unknown Sharps, MD;  Location: [facility] ORS;  Service: General;  Laterality: N/A;   CYSTOSCOPY W/ URETERAL STENT PLACEMENT Left 07/22/2024   Procedure: CYSTOSCOPY, WITH RETROGRADE PYELOGRAM AND URETERAL STENT INSERTION;  Surgeon: Selma Donnice SAUNDERS, MD;  Location: [facility] ORS;  Service: Urology;  Laterality: Left;   CYSTOSCOPY/URETEROSCOPY/HOLMIUM LASER/STENT PLACEMENT Left 08/07/2024   Procedure: CYSTOSCOPY/URETEROSCOPY/HOLMIUM LASER/STENT PLACEMENT;  Surgeon: Francisca Redell BROCKS, MD;  Location: [facility] ORS;  Service: Urology;  Laterality: Left;  EXCHANGE   MOUTH BIOPSY     benign   MOUTH SURGERY     WISDOM TOOTH EXTRACTION      Home Medications:  Active Medications[1]  Allergies: Allergies[2]  Family History  Problem Relation Age of Onset   Hyperlipidemia Mother    Diabetes Mother    Hypertension Mother    Diabetes Father     Social History:  reports that she quit smoking about 3 years ago. Her smoking use included cigarettes. She  started smoking about 8 years ago. She has a 1.3 pack-year smoking history. She has never used smokeless tobacco. She reports that she does not drink alcohol and does not use drugs.  ROS: A complete review of systems was performed.  All systems are negative except for pertinent findings as noted.  Physical Exam:  Vital signs in last 24 hours: Temp:  [97.9 F (36.6 C)-98.7 F (37.1 C)] 98.7 F (37.1 C) (01/07 0422) Pulse Rate:  [80-102] 102 (01/07 0422) Resp:  [16-20] 20 (01/07 0422) BP:  (113-147)/(71-112) 120/87 (01/07 0422) SpO2:  [94 %-98 %] 94 % (01/07 0422) Weight:  [117 kg-120 kg] 120 kg (01/07 0450) Constitutional:  Alert and oriented, no acute distress HEENT: [place] AT, moist mucus membranes Cardiovascular: No clubbing, cyanosis, or edema Respiratory: Normal respiratory effort Skin: No rashes, bruises or suspicious lesions Neurologic: Grossly intact, no focal deficits, moving all 4 extremities Psychiatric: Normal mood and affect  Laboratory Data:  Recent Labs    08/09/24 0457  WBC 9.8  HGB 12.8  HCT 40.0   Recent Labs    08/09/24 0457  NA 136  K 3.7  CL 100  CO2 24  GLUCOSE 91  BUN 8  CREATININE 0.74  CALCIUM 8.8*   Urinalysis    Component Value Date/Time   COLORURINE YELLOW (A) 08/08/2024 2350   APPEARANCEUR CLOUDY (A) 08/08/2024 2350   APPEARANCEUR Slightly cloudy 08/01/2024 1059   LABSPEC 1.009 08/08/2024 2350   LABSPEC 1.009 02/04/2012 2337   PHURINE 6.0 08/08/2024 2350   GLUCOSEU NEGATIVE 08/08/2024 2350   GLUCOSEU Negative 02/04/2012 2337   HGBUR LARGE (A) 08/08/2024 2350   BILIRUBINUR NEGATIVE 08/08/2024 2350   BILIRUBINUR Negative 08/01/2024 1059   BILIRUBINUR Negative 02/04/2012 2337   KETONESUR NEGATIVE 08/08/2024 2350   PROTEINUR 100 (A) 08/08/2024 2350   UROBILINOGEN negative (A) 12/09/2021 1627   NITRITE NEGATIVE 08/08/2024 2350   LEUKOCYTESUR MODERATE (A) 08/08/2024 2350   LEUKOCYTESUR Negative 02/04/2012 2337   Results for orders placed or performed in visit on 08/01/24  CULTURE, URINE COMPREHENSIVE     Status: Abnormal   Collection Time: 08/01/24 10:59 AM   Specimen: Urine   UR  Result Value Ref Range Status   Urine Culture, Comprehensive Final report (A)  Final   Organism ID, Bacteria Enterococcus faecalis (A)  Final    Comment: Enterococci susceptible to penicillin  are predictably susceptible to ampicillin , amoxicillin , ampicillin -sulbactam, amoxicillin -clavulanate, and piperacillin-tazobactam  for non-beta-lactamase producing enterococci. (CLSI 2018) For Enterococcus species, aminoglycosides (except for high-level resistance screening), cephalosporins, clindamycin, and trimethoprim -sulfamethoxazole  are not effective clinically. (CLSI, M100-S26, 2016) 10,000-25,000 colony forming units per mL    Organism ID, Bacteria Lactobacillus species  Final    Comment: 10,000-25,000 colony forming units per mL Susceptibility not normally performed on this organism.    ANTIMICROBIAL SUSCEPTIBILITY Comment  Final    Comment:       ** S = Susceptible; I = Intermediate; R = Resistant **                    P = Positive; N = Negative             MICS are expressed in micrograms per mL    Antibiotic                 RSLT#1    RSLT#2    RSLT#3    RSLT#4 Ciprofloxacin   S Levofloxacin                   S Nitrofurantoin                  S Penicillin                      S Tetracycline                   R Vancomycin                     S   Microscopic Examination     Status: Abnormal   Collection Time: 08/01/24 10:59 AM   Urine  Result Value Ref Range Status   WBC, UA 11-30 (A) 0 - 5 /hpf Final   RBC, Urine >30 (A) 0 - 2 /hpf Final   Epithelial Cells (non renal) >10 (A) 0 - 10 /hpf Final   Mucus, UA Present (A) Not Estab. Final   Bacteria, UA Many (A) None seen/Few Final    Radiologic Imaging: DG Abd Portable 1 View Result Date: 08/08/2024 EXAM: 1 VIEW XRAY OF THE ABDOMEN 08/08/2024 03:52:00 PM COMPARISON: None available. CLINICAL HISTORY: ureteral stent partially removed FINDINGS: LINES, TUBES AND DEVICES: Left ureteral stent in place. Proximal catheter tip projects over the pelvis, likely in the region of the mid/distal left ureter. The inferior portion of the catheter projects midline outside of the body near the perineum. BOWEL: No evidence for bowel obstruction. SOFT TISSUES: Cholecystectomy clips are present. BONES: No acute fracture. IMPRESSION: 1. Left ureteral stent in  place, with the proximal tip likely in the region of the mid to distal left ureter and the inferior portion projecting midline outside of the body near the perineum. Electronically signed by: Greig Pique MD 08/08/2024 04:32 PM EST RP Workstation: [id number]   DG OR UROLOGY CYSTO IMAGE ([facility] ONLY) Result Date: 08/07/2024 There is no interpretation for this exam.  This order is for images obtained during a surgical procedure.  Please See Surgeries Tab for more information regarding the procedure.   Assessment & Plan:  29 y.o. female with PMH left renal colic after premature ureteral stent dislodgment after undergoing left ureteroscopy with laser lithotripsy and stent exchange with Dr. Francisca 2 days ago.  Overall rather low suspicion for UTI.  That said, her preop urine culture grew low colony counts of E faecalis, which was consistent with sample contamination at the time.  Given her current admission and pain as well as reports of dysuria, I think it is appropriate to continue antibiotics for possible UTI.  I recommend adjusting her antibiotics for better coverage of this prior culture result out of an abundance of caution.  I do not recommend stent replacement at this time.  She may have a diet.  Recommend continued pain control and supportive care per primary team.  I think her medications are appropriate and we discussed that she may not be pain-free between doses of pain medication.    Recommendations: - Okay for diet, no plans for urologic procedure today - Adjust antibiotics to better cover preop urine culture from 08/01/2024, Cipro  or amoxicillin  recommended - Continue pain control and supportive care per primary team - May consider bladder scan this afternoon if she continues to be unable to void or reports the urge to void with the inability to do so  Thank you for involving me in this patient's care, please page with any further questions  or concerns.  Tandre Conly,  PA-C 08/09/2024 9:05 AM       [1]  No outpatient medications have been marked as taking for the 08/08/24 encounter [facility] Encounter).  [2]  Allergies Allergen Reactions   Bee Venom Anaphylaxis and Itching    Other Reaction(s): eye redness, facial swelling   "

## 2024-08-09 NOTE — TOC CM/SW Note (Signed)
 Transition of Care [facility]) - Inpatient Brief Assessment   Patient Details  Name: Lori Henry MRN: [id number] Date of Birth: [birth year]  Transition of Care [facility]) CM/SW Contact:    Corean ONEIDA Haddock, RN Phone Number: 08/09/2024, 10:00 AM   Clinical Narrative:   Transition of Care Department [facility]) has reviewed patient and no TOC needs have been identified at this time.  If new patient transition needs arise, please place a TOC consult.  Currently on Acute o2, with plans to wean prior to dc    Transition of Care Asessment: Insurance and Status: Insurance coverage has been reviewed Patient has primary care physician: Yes (No PCP on file, however per care everywhere PCP [facility].  List of local PCP added to AVS)     Prior/Current Home Services: No current home services Social Drivers of Health Review: SDOH reviewed no interventions necessary Readmission risk has been reviewed: No (obs status no score generated) Transition of care needs: no transition of care needs at this time

## 2024-08-09 NOTE — Progress Notes (Signed)
 " PROGRESS NOTE    Lori Henry  FMW:[id number] DOB: Feb 22, 1996 DOA: 08/08/2024 PCP: System, Provider Not In    Brief Narrative:   29 y.o. year old female with medical history of hypertension, class III obesity, nephrolithiasis status post stent placement presented to the ED with severe pain after having stent placed yesterday.  States initially she had polyuria and felt her stent dislodged and then had significant pain.  Pain was unbearable prompting her to come to the ED.  In the ED it was determined her stent has dislodged and EDP discussed with urologist who recommended removing the stent.  She denies any URI symptoms, denies any fevers or chills. On arrival to the ED patient was noted to be HDS stable.  Lab work and imaging are pending at time of admission.  Given uncontrolled pain and need for pain control, TRH contacted for admission.    Assessment & Plan:   Principal Problem:   Intractable pain Active Problems:   Nephrolithiasis   Depression   Essential hypertension   PTSD (post-traumatic stress disorder)   Morbid obesity with BMI of 45.0-49.9, adult (HCC)  Intractable pain Recently dislodged ureteral stent Stent was removed by EDP but patient remained in significant pain.  Urology contacted from emergency room. Plan: Okay for diet Multimodal pain control Flomax  Oxybutynin  Is not Urology consult    Hypertension:  Continue home lisinopril    MDD/PTSD:  Continue home regimen   Type 2 diabetes:  Holding home metformin .  Hold home Mounjaro SSI for now   Class III obesity:  BMI 46 Complicates overall care and prognosis  DVT prophylaxis: SQH Code Status: Full Family Communication: Significant other at bedside 1/7 Disposition Plan: Status is: Inpatient Remains inpatient appropriate because: Intractable pain   Level of care: Med-Surg  Consultants:  Urology  Procedures:  None  Antimicrobials: Unasyn   Subjective: Seen and examined.  Still  endorsing lower abdominal pain.  Endorses some anxiety as well.  Objective: Vitals:   08/09/24 0450 08/09/24 0907 08/09/24 0907 08/09/24 1107  BP:   115/71 100/68  Pulse:   (!) 121 (!) 116  Resp:   16 17  Temp:   98.8 F (37.1 C) 99.1 F (37.3 C)  TempSrc:   Oral Oral  SpO2:  (!) 88% 92% 98%  Weight: 120 kg     Height:        Intake/Output Summary (Last 24 hours) at 08/09/2024 1230 Last data filed at 08/09/2024 0556 Gross per 24 hour  Intake 895.92 ml  Output 1200 ml  Net -304.08 ml   Filed Weights   08/08/24 1504 08/08/24 2016 08/09/24 0450  Weight: 117 kg 119.3 kg 120 kg    Examination:  General exam: No acute distress Respiratory system: Clear to auscultation. Respiratory effort normal. Cardiovascular system: S1-S2, tachycardic, regular rhythm, no murmurs Gastrointestinal system: Soft, diffusely tender, normal bowel sounds Central nervous system: Alert and oriented. No focal neurological deficits. Extremities: Symmetric 5 x 5 power. Skin: No rashes, lesions or ulcers Psychiatry: Judgement and insight appear normal. Mood & affect anxious.     Data Reviewed: I have personally reviewed following labs and imaging studies  CBC: Recent Labs  Lab 08/09/24 0457  WBC 9.8  HGB 12.8  HCT 40.0  MCV 87.5  PLT 341   Basic Metabolic Panel: Recent Labs  Lab 08/08/24 2021 08/09/24 0457  NA  --  136  K  --  3.7  CL  --  100  CO2  --  24  GLUCOSE  --  91  BUN  --  8  CREATININE  --  0.74  CALCIUM  --  8.8*  MG 1.9  --    GFR: Estimated Creatinine Clearance: 131.2 mL/min (by C-G formula based on SCr of 0.74 mg/dL). Liver Function Tests: No results for input(s): AST, ALT, ALKPHOS, BILITOT, PROT, ALBUMIN in the last 168 hours. No results for input(s): LIPASE, AMYLASE in the last 168 hours. No results for input(s): AMMONIA  in the last 168 hours. Coagulation Profile: No results for input(s): INR, PROTIME in the last 168 hours. Cardiac  Enzymes: No results for input(s): CKTOTAL, CKMB, CKMBINDEX, TROPONINI in the last 168 hours. BNP (last 3 results) No results for input(s): PROBNP in the last 8760 hours. HbA1C: No results for input(s): HGBA1C in the last 72 hours. CBG: Recent Labs  Lab 08/07/24 1222 08/07/24 1335 08/09/24 0137 08/09/24 0905  GLUCAP 100* 104* 93 155*   Lipid Profile: No results for input(s): CHOL, HDL, LDLCALC, TRIG, CHOLHDL, LDLDIRECT in the last 72 hours. Thyroid Function Tests: No results for input(s): TSH, T4TOTAL, FREET4, T3FREE, THYROIDAB in the last 72 hours. Anemia Panel: No results for input(s): VITAMINB12, FOLATE, FERRITIN, TIBC, IRON, RETICCTPCT in the last 72 hours. Sepsis Labs: No results for input(s): PROCALCITON, LATICACIDVEN in the last 168 hours.  Recent Results (from the past 240 hours)  CULTURE, URINE COMPREHENSIVE     Status: Abnormal   Collection Time: 08/01/24 10:59 AM   Specimen: Urine   UR  Result Value Ref Range Status   Urine Culture, Comprehensive Final report (A)  Final   Organism ID, Bacteria Enterococcus faecalis (A)  Final    Comment: Enterococci susceptible to penicillin  are predictably susceptible to ampicillin , amoxicillin , ampicillin -sulbactam, amoxicillin -clavulanate, and piperacillin-tazobactam for non-beta-lactamase producing enterococci. (CLSI 2018) For Enterococcus species, aminoglycosides (except for high-level resistance screening), cephalosporins, clindamycin, and trimethoprim -sulfamethoxazole  are not effective clinically. (CLSI, M100-S26, 2016) 10,000-25,000 colony forming units per mL    Organism ID, Bacteria Lactobacillus species  Final    Comment: 10,000-25,000 colony forming units per mL Susceptibility not normally performed on this organism.    ANTIMICROBIAL SUSCEPTIBILITY Comment  Final    Comment:       ** S = Susceptible; I = Intermediate; R = Resistant **                    P =  Positive; N = Negative             MICS are expressed in micrograms per mL    Antibiotic                 RSLT#1    RSLT#2    RSLT#3    RSLT#4 Ciprofloxacin                   S Levofloxacin                   S Nitrofurantoin                  S Penicillin                      S Tetracycline                   R Vancomycin                     S   Microscopic Examination     Status: Abnormal  Collection Time: 08/01/24 10:59 AM   Urine  Result Value Ref Range Status   WBC, UA 11-30 (A) 0 - 5 /hpf Final   RBC, Urine >30 (A) 0 - 2 /hpf Final   Epithelial Cells (non renal) >10 (A) 0 - 10 /hpf Final   Mucus, UA Present (A) Not Estab. Final   Bacteria, UA Many (A) None seen/Few Final         Radiology Studies: DG Chest Port 1 View Result Date: 08/09/2024 CLINICAL DATA:  Hypoxia. EXAM: PORTABLE CHEST 1 VIEW COMPARISON:  09/25/2022 FINDINGS: Lungs are hypoinflated and otherwise clear. Cardiomediastinal silhouette and remainder of the exam is unchanged. IMPRESSION: Hypoinflation without acute cardiopulmonary disease. Electronically Signed   By: Toribio Agreste M.D.   On: 08/09/2024 11:24   DG Abd Portable 1 View Result Date: 08/08/2024 EXAM: 1 VIEW XRAY OF THE ABDOMEN 08/08/2024 03:52:00 PM COMPARISON: None available. CLINICAL HISTORY: ureteral stent partially removed FINDINGS: LINES, TUBES AND DEVICES: Left ureteral stent in place. Proximal catheter tip projects over the pelvis, likely in the region of the mid/distal left ureter. The inferior portion of the catheter projects midline outside of the body near the perineum. BOWEL: No evidence for bowel obstruction. SOFT TISSUES: Cholecystectomy clips are present. BONES: No acute fracture. IMPRESSION: 1. Left ureteral stent in place, with the proximal tip likely in the region of the mid to distal left ureter and the inferior portion projecting midline outside of the body near the perineum. Electronically signed by: Greig Pique MD 08/08/2024 04:32 PM EST RP  Workstation: [id number]   DG OR UROLOGY CYSTO IMAGE ([facility] ONLY) Result Date: 08/07/2024 There is no interpretation for this exam.  This order is for images obtained during a surgical procedure.  Please See Surgeries Tab for more information regarding the procedure.        Scheduled Meds:  acetaminophen   1,000 mg Oral Q6H   FLUoxetine   40 mg Oral Daily   heparin   5,000 Units Subcutaneous Q8H   insulin  aspart  0-5 Units Subcutaneous QHS   insulin  aspart  0-9 Units Subcutaneous TID WC   ketorolac   15 mg Intravenous Q8H   oxybutynin   15 mg Oral Daily   pantoprazole   40 mg Oral Daily   sodium chloride  flush  3 mL Intravenous Q12H   tamsulosin   0.4 mg Oral BID   Continuous Infusions:   LOS: 0 days       Calvin KATHEE Robson, MD [facility]   If 7PM-7AM, please contact night-coverage  08/09/2024, 12:30 PM   "

## 2024-08-09 NOTE — Plan of Care (Signed)
" °  Problem: Clinical Measurements: Goal: Ability to maintain clinical measurements within normal limits will improve Outcome: Progressing   Problem: Pain Managment: Goal: General experience of comfort will improve and/or be controlled Outcome: Progressing   Problem: Safety: Goal: Ability to remain free from injury will improve Outcome: Progressing   Problem: Elimination: Goal: Will not experience complications related to urinary retention Outcome: Progressing   Problem: Coping: Goal: Level of anxiety will decrease Outcome: Progressing   "

## 2024-08-10 DIAGNOSIS — R52 Pain, unspecified: Secondary | ICD-10-CM | POA: Diagnosis not present

## 2024-08-10 DIAGNOSIS — T83122A Displacement of urinary stent, initial encounter: Secondary | ICD-10-CM | POA: Diagnosis not present

## 2024-08-10 LAB — GLUCOSE, CAPILLARY
Glucose-Capillary: 99 mg/dL (ref 70–99)
Glucose-Capillary: 113 mg/dL — ABNORMAL HIGH (ref 70–99)
Glucose-Capillary: 113 mg/dL — ABNORMAL HIGH (ref 70–99)
Glucose-Capillary: 101 mg/dL — ABNORMAL HIGH (ref 70–99)

## 2024-08-10 LAB — URINE CULTURE: Culture: NO GROWTH

## 2024-08-10 MED ORDER — OXYCODONE HCL 5 MG PO TABS
5.0000 mg | ORAL_TABLET | ORAL | Status: DC | PRN
  Administered 2024-08-10 – 2024-08-12 (×9): 10 mg via ORAL
  Filled 2024-08-10 (×9): qty 2

## 2024-08-10 MED ORDER — KETOROLAC TROMETHAMINE 15 MG/ML IJ SOLN
15.0000 mg | Freq: Four times a day (QID) | INTRAMUSCULAR | Status: AC
  Administered 2024-08-10 – 2024-08-11 (×4): 15 mg via INTRAVENOUS
  Filled 2024-08-10 (×4): qty 1

## 2024-08-10 MED ORDER — HYDROMORPHONE HCL 1 MG/ML IJ SOLN
0.5000 mg | INTRAMUSCULAR | Status: DC | PRN
  Administered 2024-08-10 – 2024-08-11 (×4): 0.5 mg via INTRAVENOUS
  Filled 2024-08-10 (×4): qty 0.5

## 2024-08-10 NOTE — Progress Notes (Signed)
 Urology Inpatient Progress Note  Subjective: No acute events overnight. She is afebrile, VSS. Admission urine culture was finalized with no growth.  On antibiotics as below. She reports ongoing severe flank pain.  Pain medication improves it to 5/10 in intensity.  It has not improved since admission.  Toradol  and oxycodone  do not help.  She states the Dilaudid  is the only medication that helps with the pain.  She also reports some urinary hesitancy.  Anti-infectives: Anti-infectives (From admission, onward)    Start     Dose/Rate Route Frequency Ordered Stop   08/09/24 1400  ampicillin  (OMNIPEN) 2 g in sodium chloride  0.9 % 100 mL IVPB        2 g 300 mL/hr over 20 Minutes Intravenous Every 6 hours 08/09/24 1318     08/09/24 0200  cefTRIAXone  (ROCEPHIN ) 1 g in sodium chloride  0.9 % 100 mL IVPB  Status:  Discontinued        1 g 200 mL/hr over 30 Minutes Intravenous Every 24 hours 08/09/24 0101 08/09/24 1214       Current Facility-Administered Medications  Medication Dose Route Frequency Provider Last Rate Last Admin   acetaminophen  (TYLENOL ) tablet 1,000 mg  1,000 mg Oral Q6H Khan, Ghalib, MD   1,000 mg at 08/10/24 0304   ALPRAZolam  (XANAX ) tablet 0.5 mg  0.5 mg Oral TID PRN Sreenath, Sudheer B, MD   0.5 mg at 08/10/24 0844   ampicillin  (OMNIPEN) 2 g in sodium chloride  0.9 % 100 mL IVPB  2 g Intravenous Q6H Lenon Elsie HERO, RPH 300 mL/hr at 08/10/24 1300 2 g at 08/10/24 1300   FLUoxetine  (PROZAC ) capsule 40 mg  40 mg Oral Daily Sreenath, Sudheer B, MD   40 mg at 08/10/24 0836   heparin  injection 5,000 Units  5,000 Units Subcutaneous Q8H Fernand Prost, MD   5,000 Units at 08/10/24 1223   HYDROmorphone  (DILAUDID ) injection 0.5 mg  0.5 mg Intravenous Q3H PRN Jhonny Sahara B, MD       insulin  aspart (novoLOG ) injection 0-5 Units  0-5 Units Subcutaneous QHS Fernand Prost, MD       insulin  aspart (novoLOG ) injection 0-9 Units  0-9 Units Subcutaneous TID WC Khan, Ghalib, MD   1 Units at  08/09/24 1357   ketorolac  (TORADOL ) 15 MG/ML injection 15 mg  15 mg Intravenous Q6H Sreenath, Sudheer B, MD   15 mg at 08/10/24 1223   ondansetron  (ZOFRAN ) tablet 4 mg  4 mg Oral Q6H PRN Fernand Prost, MD       Or   ondansetron  (ZOFRAN ) injection 4 mg  4 mg Intravenous Q6H PRN Khan, Ghalib, MD   4 mg at 08/10/24 9161   oxybutynin  (DITROPAN -XL) 24 hr tablet 15 mg  15 mg Oral Daily Sreenath, Sudheer B, MD   15 mg at 08/10/24 9164   oxyCODONE  (Oxy IR/ROXICODONE ) immediate release tablet 5-10 mg  5-10 mg Oral Q4H PRN Sreenath, Sudheer B, MD   10 mg at 08/10/24 1258   pantoprazole  (PROTONIX ) EC tablet 40 mg  40 mg Oral Daily Sreenath, Sudheer B, MD   40 mg at 08/10/24 0836   phenol (CHLORASEPTIC) mouth spray 1 spray  1 spray Mouth/Throat PRN Khan, Ghalib, MD   1 spray at 08/08/24 2208   senna-docusate (Senokot-S) tablet 1 tablet  1 tablet Oral QHS PRN Fernand Prost, MD       sodium chloride  flush (NS) 0.9 % injection 3 mL  3 mL Intravenous Q12H Khan, Ghalib, MD   3 mL at  08/10/24 0837   tamsulosin  (FLOMAX ) capsule 0.4 mg  0.4 mg Oral BID Jhonny Sahara B, MD   0.4 mg at 08/10/24 0836   Objective: Vital signs in last 24 hours: Temp:  [97.9 F (36.6 C)-98.6 F (37 C)] 98.5 F (36.9 C) (01/08 0829) Pulse Rate:  [70-96] 76 (01/08 0829) Resp:  [14-18] 14 (01/08 0829) BP: (94-125)/(56-84) 112/57 (01/08 0829) SpO2:  [98 %-100 %] 98 % (01/08 0829)  Intake/Output from previous day: 01/07 0701 - 01/08 0700 In: 780 [P.O.:480; IV Piggyback:300] Out: 600 [Urine:600] Intake/Output this shift: Total I/O In: 240 [P.O.:240] Out: -   Physical Exam Vitals and nursing note reviewed.  Constitutional:      General: She is not in acute distress.    Appearance: She is not ill-appearing, toxic-appearing or diaphoretic.  HENT:     Head: Normocephalic and atraumatic.  Pulmonary:     Effort: Pulmonary effort is normal. No respiratory distress.  Skin:    General: Skin is warm and dry.  Neurological:      Mental Status: She is alert and oriented to person, place, and time.  Psychiatric:        Mood and Affect: Mood normal.        Behavior: Behavior normal.    Lab Results:  Recent Labs    08/09/24 0457  WBC 9.8  HGB 12.8  HCT 40.0  PLT 341   BMET Recent Labs    08/09/24 0457  NA 136  K 3.7  CL 100  CO2 24  GLUCOSE 91  BUN 8  CREATININE 0.74  CALCIUM 8.8*   Studies/Results: DG Chest Port 1 View Result Date: 08/09/2024 CLINICAL DATA:  Hypoxia. EXAM: PORTABLE CHEST 1 VIEW COMPARISON:  09/25/2022 FINDINGS: Lungs are hypoinflated and otherwise clear. Cardiomediastinal silhouette and remainder of the exam is unchanged. IMPRESSION: Hypoinflation without acute cardiopulmonary disease. Electronically Signed   By: Toribio Agreste M.D.   On: 08/09/2024 11:24   DG Abd Portable 1 View Result Date: 08/08/2024 EXAM: 1 VIEW XRAY OF THE ABDOMEN 08/08/2024 03:52:00 PM COMPARISON: None available. CLINICAL HISTORY: ureteral stent partially removed FINDINGS: LINES, TUBES AND DEVICES: Left ureteral stent in place. Proximal catheter tip projects over the pelvis, likely in the region of the mid/distal left ureter. The inferior portion of the catheter projects midline outside of the body near the perineum. BOWEL: No evidence for bowel obstruction. SOFT TISSUES: Cholecystectomy clips are present. BONES: No acute fracture. IMPRESSION: 1. Left ureteral stent in place, with the proximal tip likely in the region of the mid to distal left ureter and the inferior portion projecting midline outside of the body near the perineum. Electronically signed by: Greig Pique MD 08/08/2024 04:32 PM EST RP Workstation: [id number]   Assessment & Plan: 29 y.o. female with left renal colic after premature ureteral stent dislodgment after undergoing left ureteroscopy with laser lithotripsy and stent exchange with Dr. Francisca 3 days ago.  I think it is reasonable to stop oxybutynin  given reports of urinary hesitancy, though I also  question an element of constipation due to pain medication that could also be contributory.  Given ongoing pain, would recommend repeat CTAP without contrast tomorrow morning if she continues to report no improvement overnight.  Recommendations: - Okay to discontinue antibiotics given negative culture - Stop oxybutynin  - Pain control and supportive care per primary team - CTAP without contrast in the morning if no pain improvement by then  Lore Polka, PA-C 08/10/2024

## 2024-08-10 NOTE — Progress Notes (Signed)
 " PROGRESS NOTE    Lori Henry  FMW:[id number] DOB: 1996-07-16 DOA: 08/08/2024 PCP: System, Provider Not In    Brief Narrative:   29 y.o. year old female with medical history of hypertension, class III obesity, nephrolithiasis status post stent placement presented to the ED with severe pain after having stent placed yesterday.  States initially she had polyuria and felt her stent dislodged and then had significant pain.  Pain was unbearable prompting her to come to the ED.  In the ED it was determined her stent has dislodged and EDP discussed with urologist who recommended removing the stent.  She denies any URI symptoms, denies any fevers or chills. On arrival to the ED patient was noted to be HDS stable.  Lab work and imaging are pending at time of admission.  Given uncontrolled pain and need for pain control, [facility] contacted for admission.    Assessment & Plan:   Principal Problem:   Intractable pain Active Problems:   Nephrolithiasis   Depression   Essential hypertension   PTSD (post-traumatic stress disorder)   Morbid obesity with BMI of 45.0-49.9, adult (HCC)  Intractable pain Recently dislodged ureteral stent Stent was removed by EDP but patient remained in significant pain.  Urology contacted from emergency room. Plan: Conservative management Okay for diet Multimodal pain control, minimize IV narcotics Flomax  Oxybutynin  Mobilize    Hypertension Continue home lisinopril    MDD/PTSD Continue home regimen   Type 2 diabetes:  Continue Holding home metformin .  Hold home Mounjaro SSI for now   Class III obesity:  BMI 46 Complicates overall care and prognosis  DVT prophylaxis: SQH Code Status: Full Family Communication: Significant other at bedside 1/7, 1/8 Disposition Plan: Status is: Inpatient Remains inpatient appropriate because: Intractable pain   Level of care: Med-Surg  Consultants:  Urology  Procedures:   None  Antimicrobials: Unasyn   Subjective: Seen and examined.  Continues to endorse abdominal pain and dysuria  Objective: Vitals:   08/09/24 2100 08/09/24 2334 08/10/24 0302 08/10/24 0829  BP: 125/80 100/68 117/84 (!) 112/57  Pulse: 74 84 70 76  Resp:  18 18 14   Temp:  97.9 F (36.6 C) 98.3 F (36.8 C) 98.5 F (36.9 C)  TempSrc:   Oral Oral  SpO2:  100% 99% 98%  Weight:      Height:        Intake/Output Summary (Last 24 hours) at 08/10/2024 1057 Last data filed at 08/10/2024 1027 Gross per 24 hour  Intake 1020 ml  Output 600 ml  Net 420 ml   Filed Weights   08/08/24 1504 08/08/24 2016 08/09/24 0450  Weight: 117 kg 119.3 kg 120 kg    Examination:  General exam: NAD.  Appears fatigued Respiratory system: Bibasilar crackles.  Normal work of breathing.  Room air Cardiovascular system: S1-S2, tachycardic, regular rhythm, no murmurs Gastrointestinal system: Soft, diffusely tender, normal bowel sounds Central nervous system: Alert and oriented. No focal neurological deficits. Extremities: Symmetric 5 x 5 power. Skin: No rashes, lesions or ulcers Psychiatry: Judgement and insight appear normal. Mood & affect anxious.     Data Reviewed: I have personally reviewed following labs and imaging studies  CBC: Recent Labs  Lab 08/09/24 0457  WBC 9.8  HGB 12.8  HCT 40.0  MCV 87.5  PLT 341   Basic Metabolic Panel: Recent Labs  Lab 08/08/24 2021 08/09/24 0457  NA  --  136  K  --  3.7  CL  --  100  CO2  --  24  GLUCOSE  --  91  BUN  --  8  CREATININE  --  0.74  CALCIUM  --  8.8*  MG 1.9  --    GFR: Estimated Creatinine Clearance: 131.2 mL/min (by C-G formula based on SCr of 0.74 mg/dL). Liver Function Tests: No results for input(s): AST, ALT, ALKPHOS, BILITOT, PROT, ALBUMIN in the last 168 hours. No results for input(s): LIPASE, AMYLASE in the last 168 hours. No results for input(s): AMMONIA  in the last 168 hours. Coagulation  Profile: No results for input(s): INR, PROTIME in the last 168 hours. Cardiac Enzymes: No results for input(s): CKTOTAL, CKMB, CKMBINDEX, TROPONINI in the last 168 hours. BNP (last 3 results) No results for input(s): PROBNP in the last 8760 hours. HbA1C: No results for input(s): HGBA1C in the last 72 hours. CBG: Recent Labs  Lab 08/09/24 0905 08/09/24 1243 08/09/24 1711 08/09/24 2120 08/10/24 0854  GLUCAP 155* 136* 121* 99 99   Lipid Profile: No results for input(s): CHOL, HDL, LDLCALC, TRIG, CHOLHDL, LDLDIRECT in the last 72 hours. Thyroid Function Tests: No results for input(s): TSH, T4TOTAL, FREET4, T3FREE, THYROIDAB in the last 72 hours. Anemia Panel: No results for input(s): VITAMINB12, FOLATE, FERRITIN, TIBC, IRON, RETICCTPCT in the last 72 hours. Sepsis Labs: No results for input(s): PROCALCITON, LATICACIDVEN in the last 168 hours.  Recent Results (from the past 240 hours)  CULTURE, URINE COMPREHENSIVE     Status: Abnormal   Collection Time: 08/01/24 10:59 AM   Specimen: Urine   UR  Result Value Ref Range Status   Urine Culture, Comprehensive Final report (A)  Final   Organism ID, Bacteria Enterococcus faecalis (A)  Final    Comment: Enterococci susceptible to penicillin  are predictably susceptible to ampicillin , amoxicillin , ampicillin -sulbactam, amoxicillin -clavulanate, and piperacillin-tazobactam for non-beta-lactamase producing enterococci. (CLSI 2018) For Enterococcus species, aminoglycosides (except for high-level resistance screening), cephalosporins, clindamycin, and trimethoprim -sulfamethoxazole  are not effective clinically. (CLSI, M100-S26, 2016) 10,000-25,000 colony forming units per mL    Organism ID, Bacteria Lactobacillus species  Final    Comment: 10,000-25,000 colony forming units per mL Susceptibility not normally performed on this organism.    ANTIMICROBIAL SUSCEPTIBILITY Comment  Final     Comment:       ** S = Susceptible; I = Intermediate; R = Resistant **                    P = Positive; N = Negative             MICS are expressed in micrograms per mL    Antibiotic                 RSLT#1    RSLT#2    RSLT#3    RSLT#4 Ciprofloxacin                   S Levofloxacin                   S Nitrofurantoin                  S Penicillin                      S Tetracycline                   R Vancomycin                     S   Microscopic Examination  Status: Abnormal   Collection Time: 08/01/24 10:59 AM   Urine  Result Value Ref Range Status   WBC, UA 11-30 (A) 0 - 5 /hpf Final   RBC, Urine >30 (A) 0 - 2 /hpf Final   Epithelial Cells (non renal) >10 (A) 0 - 10 /hpf Final   Mucus, UA Present (A) Not Estab. Final   Bacteria, UA Many (A) None seen/Few Final  Urine Culture (for pregnant, neutropenic or urologic patients or patients with an indwelling urinary catheter)     Status: None   Collection Time: 08/09/24 12:01 AM   Specimen: Urine, Clean Catch  Result Value Ref Range Status   Specimen Description   Final    URINE, CLEAN CATCH Performed at [facility], [address] [place], [place] [address]    Special Requests   Final    NONE Performed at [facility], [address] [place], [place] [address]    Culture   Final    NO GROWTH Performed at [facility], [address] [place], [place] [address]    Report Status 08/10/2024 FINAL  Final         Radiology Studies: DG Chest Port 1 View Result Date: 08/09/2024 CLINICAL DATA:  Hypoxia. EXAM: PORTABLE CHEST 1 VIEW COMPARISON:  09/25/2022 FINDINGS: Lungs are hypoinflated and otherwise clear. Cardiomediastinal silhouette and remainder of the exam is unchanged. IMPRESSION: Hypoinflation without acute cardiopulmonary disease. Electronically Signed   By: Toribio Agreste M.D.   On: 08/09/2024 11:24   DG Abd Portable 1 View Result Date: 08/08/2024 EXAM: 1 VIEW XRAY OF THE ABDOMEN 08/08/2024  03:52:00 PM COMPARISON: None available. CLINICAL HISTORY: ureteral stent partially removed FINDINGS: LINES, TUBES AND DEVICES: Left ureteral stent in place. Proximal catheter tip projects over the pelvis, likely in the region of the mid/distal left ureter. The inferior portion of the catheter projects midline outside of the body near the perineum. BOWEL: No evidence for bowel obstruction. SOFT TISSUES: Cholecystectomy clips are present. BONES: No acute fracture. IMPRESSION: 1. Left ureteral stent in place, with the proximal tip likely in the region of the mid to distal left ureter and the inferior portion projecting midline outside of the body near the perineum. Electronically signed by: Greig Pique MD 08/08/2024 04:32 PM EST RP Workstation: [id number]        Scheduled Meds:  acetaminophen   1,000 mg Oral Q6H   FLUoxetine   40 mg Oral Daily   heparin   5,000 Units Subcutaneous Q8H   insulin  aspart  0-5 Units Subcutaneous QHS   insulin  aspart  0-9 Units Subcutaneous TID WC   ketorolac   15 mg Intravenous Q6H   oxybutynin   15 mg Oral Daily   pantoprazole   40 mg Oral Daily   sodium chloride  flush  3 mL Intravenous Q12H   tamsulosin   0.4 mg Oral BID   Continuous Infusions:  ampicillin  (OMNIPEN) IV 2 g (08/10/24 0835)     LOS: 1 day       Calvin KATHEE Robson, MD [facility]   If 7PM-7AM, please contact night-coverage  08/10/2024, 10:57 AM   "

## 2024-08-10 NOTE — TOC Progression Note (Signed)
 Transition of Care [facility]) - Progression Note    Patient Details  Name: Lori Henry MRN: [id number] Date of Birth: [birth year]  [facility] CM/SW Contact  Corean ONEIDA Haddock, RN Phone Number: 08/10/2024, 4:03 PM  Clinical Narrative:        If patient requires home o2 at discharge patient will require qualifying O2 sats and order                    Expected Discharge Plan and Services                                               Social Drivers of Health (SDOH) Interventions SDOH Screenings   Food Insecurity: No Food Insecurity (08/08/2024)  Housing: Low Risk (08/08/2024)  Transportation Needs: No Transportation Needs (08/08/2024)  Utilities: Not At Risk (08/08/2024)  Depression (PHQ2-9): Low Risk (09/28/2022)  Financial Resource Strain: Low Risk  (01/10/2024)   Received from [facility]  Tobacco Use: Medium Risk (08/08/2024)    Readmission Risk Interventions     No data to display

## 2024-08-11 ENCOUNTER — Inpatient Hospital Stay

## 2024-08-11 DIAGNOSIS — T83122A Displacement of urinary stent, initial encounter: Secondary | ICD-10-CM | POA: Diagnosis not present

## 2024-08-11 DIAGNOSIS — R52 Pain, unspecified: Secondary | ICD-10-CM | POA: Diagnosis not present

## 2024-08-11 LAB — BASIC METABOLIC PANEL WITH GFR
Anion gap: 8 (ref 5–15)
BUN: 9 mg/dL (ref 6–20)
CO2: 27 mmol/L (ref 22–32)
Calcium: 8.5 mg/dL — ABNORMAL LOW (ref 8.9–10.3)
Chloride: 104 mmol/L (ref 98–111)
Creatinine, Ser: 0.72 mg/dL (ref 0.44–1.00)
GFR, Estimated: >60 mL/min
Glucose, Bld: 121 mg/dL — ABNORMAL HIGH (ref 70–99)
Potassium: 3.7 mmol/L (ref 3.5–5.1)
Sodium: 139 mmol/L (ref 135–145)

## 2024-08-11 LAB — GLUCOSE, CAPILLARY
Glucose-Capillary: 91 mg/dL (ref 70–99)
Glucose-Capillary: 103 mg/dL — ABNORMAL HIGH (ref 70–99)
Glucose-Capillary: 112 mg/dL — ABNORMAL HIGH (ref 70–99)
Glucose-Capillary: 108 mg/dL — ABNORMAL HIGH (ref 70–99)

## 2024-08-11 LAB — CBC WITH DIFFERENTIAL/PLATELET
Abs Immature Granulocytes: 0.02 K/uL (ref 0.00–0.07)
Basophils Absolute: 0 K/uL (ref 0.0–0.1)
Basophils Relative: 1 %
Eosinophils Absolute: 0.3 K/uL (ref 0.0–0.5)
Eosinophils Relative: 5 %
HCT: 32.8 % — ABNORMAL LOW (ref 36.0–46.0)
Hemoglobin: 10.5 g/dL — ABNORMAL LOW (ref 12.0–15.0)
Immature Granulocytes: 0 %
Lymphocytes Relative: 38 %
Lymphs Abs: 2.3 K/uL (ref 0.7–4.0)
MCH: 28.3 pg (ref 26.0–34.0)
MCHC: 32 g/dL (ref 30.0–36.0)
MCV: 88.4 fL (ref 80.0–100.0)
Monocytes Absolute: 0.5 K/uL (ref 0.1–1.0)
Monocytes Relative: 7 %
Neutro Abs: 3 K/uL (ref 1.7–7.7)
Neutrophils Relative %: 49 %
Platelets: 288 K/uL (ref 150–400)
RBC: 3.71 MIL/uL — ABNORMAL LOW (ref 3.87–5.11)
RDW: 12.8 % (ref 11.5–15.5)
WBC: 6.1 K/uL (ref 4.0–10.5)
nRBC: 0 % (ref 0.0–0.2)

## 2024-08-11 MED ORDER — METHOCARBAMOL 500 MG PO TABS
500.0000 mg | ORAL_TABLET | Freq: Three times a day (TID) | ORAL | Status: DC
  Administered 2024-08-11 – 2024-08-12 (×3): 500 mg via ORAL
  Filled 2024-08-11 (×3): qty 1

## 2024-08-11 MED ORDER — ACETAMINOPHEN 325 MG PO TABS
650.0000 mg | ORAL_TABLET | Freq: Four times a day (QID) | ORAL | Status: DC | PRN
  Administered 2024-08-11: 650 mg via ORAL
  Filled 2024-08-11: qty 2

## 2024-08-11 MED ORDER — AMOXICILLIN 500 MG PO CAPS
1000.0000 mg | ORAL_CAPSULE | Freq: Three times a day (TID) | ORAL | Status: DC
  Administered 2024-08-12: 1000 mg via ORAL
  Filled 2024-08-11 (×2): qty 2

## 2024-08-11 NOTE — Progress Notes (Signed)
 " PROGRESS NOTE    Lori Henry  FMW:[id number] DOB: 08-13-1995 DOA: 08/08/2024 PCP: System, Provider Not In    Brief Narrative:   29 y.o. year old female with medical history of hypertension, class III obesity, nephrolithiasis status post stent placement presented to the ED with severe pain after having stent placed yesterday.  States initially she had polyuria and felt her stent dislodged and then had significant pain.  Pain was unbearable prompting her to come to the ED.  In the ED it was determined her stent has dislodged and EDP discussed with urologist who recommended removing the stent.  She denies any URI symptoms, denies any fevers or chills. On arrival to the ED patient was noted to be HDS stable.  Lab work and imaging are pending at time of admission.  Given uncontrolled pain and need for pain control, [facility] contacted for admission.    Assessment & Plan:   Principal Problem:   Intractable pain Active Problems:   Nephrolithiasis   Depression   Essential hypertension   PTSD (post-traumatic stress disorder)   Morbid obesity with BMI of 45.0-49.9, adult (HCC)  Intractable pain Recently dislodged ureteral stent Stent was removed by EDP but patient remained in significant pain.  Urology contacted from emergency room.  Pain control has been challenging.  Pain seemingly out of proportion to exam.  Repeat CT abdomen pelvis on 1/8 without evidence of hydronephrosis or retained stone fragments but did show mild left-sided hydroureter and ureteral inflammation consistent with recently dislodged stent Plan: Conservative management Okay for diet Multimodal pain control, discontinue IV narcotics Add Robaxin  500 3 times daily in addition to oxycodone  Continue ampicillin  for today and transition to amoxicillin  started 1/10 Flomax  Oxybutynin  Mobilize Anticipate discharge 1/10  Severe hepatic steatosis Noted incidentally on CAT scan 1/9.  This has been present on abdominal films  dating back to 2018.  Patient has been advised on the presence of hepatic steatosis in the need for diet adjustment, weight loss, close monitoring    Hypertension Continue home lisinopril    MDD/PTSD Continue home regimen   Type 2 diabetes:  Continue Holding home metformin .  Hold home Mounjaro SSI for now   Class III obesity:  BMI 46 Complicates overall care and prognosis  DVT prophylaxis: SQH Code Status: Full Family Communication: Significant other at bedside 1/7, 1/8, 1/9 Disposition Plan: Status is: Inpatient Remains inpatient appropriate because: Intractable pain   Level of care: Med-Surg  Consultants:  Urology  Procedures:  None  Antimicrobials: Unasyn   Subjective: Seen and examined.  Appears comfortable.  States her pain is unchanged  Objective: Vitals:   08/10/24 2000 08/11/24 0311 08/11/24 0740 08/11/24 1446  BP: 112/85 113/77 115/72 122/84  Pulse: 86 85 99 87  Resp: 17 18 18 15   Temp: 98.2 F (36.8 C) 98.3 F (36.8 C) 98 F (36.7 C) 97.9 F (36.6 C)  TempSrc: Oral     SpO2: 95% 92% 93% 96%  Weight:  124 kg    Height:        Intake/Output Summary (Last 24 hours) at 08/11/2024 1516 Last data filed at 08/11/2024 1403 Gross per 24 hour  Intake 240 ml  Output --  Net 240 ml   Filed Weights   08/08/24 2016 08/09/24 0450 08/11/24 0311  Weight: 119.3 kg 120 kg 124 kg    Examination:  General exam: No acute distress Respiratory system: Clear lungs.  Normal work of breathing.  Room air Cardiovascular system: 1 S2, RRR, no murmurs,  no pedal edema Gastrointestinal system: Obese, soft, diffusely tender, normal bowel sounds Central nervous system: Alert and oriented. No focal neurological deficits. Extremities: Symmetric 5 x 5 power. Skin: No rashes, lesions or ulcers Psychiatry: Judgement and insight appear normal. Mood & affect anxious.     Data Reviewed: I have personally reviewed following labs and imaging studies  CBC: Recent Labs  Lab  08/09/24 0457 08/11/24 1436  WBC 9.8 6.1  NEUTROABS  --  3.0  HGB 12.8 10.5*  HCT 40.0 32.8*  MCV 87.5 88.4  PLT 341 288   Basic Metabolic Panel: Recent Labs  Lab 08/08/24 2021 08/09/24 0457  NA  --  136  K  --  3.7  CL  --  100  CO2  --  24  GLUCOSE  --  91  BUN  --  8  CREATININE  --  0.74  CALCIUM  --  8.8*  MG 1.9  --    GFR: Estimated Creatinine Clearance: 133.9 mL/min (by C-G formula based on SCr of 0.74 mg/dL). Liver Function Tests: No results for input(s): AST, ALT, ALKPHOS, BILITOT, PROT, ALBUMIN in the last 168 hours. No results for input(s): LIPASE, AMYLASE in the last 168 hours. No results for input(s): AMMONIA  in the last 168 hours. Coagulation Profile: No results for input(s): INR, PROTIME in the last 168 hours. Cardiac Enzymes: No results for input(s): CKTOTAL, CKMB, CKMBINDEX, TROPONINI in the last 168 hours. BNP (last 3 results) No results for input(s): PROBNP in the last 8760 hours. HbA1C: No results for input(s): HGBA1C in the last 72 hours. CBG: Recent Labs  Lab 08/10/24 1142 08/10/24 1709 08/10/24 2129 08/11/24 0741 08/11/24 1116  GLUCAP 113* 113* 101* 91 103*   Lipid Profile: No results for input(s): CHOL, HDL, LDLCALC, TRIG, CHOLHDL, LDLDIRECT in the last 72 hours. Thyroid Function Tests: No results for input(s): TSH, T4TOTAL, FREET4, T3FREE, THYROIDAB in the last 72 hours. Anemia Panel: No results for input(s): VITAMINB12, FOLATE, FERRITIN, TIBC, IRON, RETICCTPCT in the last 72 hours. Sepsis Labs: No results for input(s): PROCALCITON, LATICACIDVEN in the last 168 hours.  Recent Results (from the past 240 hours)  Urine Culture (for pregnant, neutropenic or urologic patients or patients with an indwelling urinary catheter)     Status: None   Collection Time: 08/09/24 12:01 AM   Specimen: Urine, Clean Catch  Result Value Ref Range Status   Specimen Description    Final    URINE, CLEAN CATCH Performed at [facility], [address], [place], [place] [address]    Special Requests   Final    NONE Performed at [facility], [address], [place], [place] [address]    Culture   Final    NO GROWTH Performed at [facility], [address] [place], [place] [address]    Report Status 08/10/2024 FINAL  Final         Radiology Studies: CT RENAL STONE STUDY Result Date: 08/11/2024 EXAM: CT ABDOMEN AND PELVIS WITHOUT CONTRAST 08/11/2024 09:54:00 AM TECHNIQUE: CT of the abdomen and pelvis was performed without the administration of intravenous contrast. Multiplanar reformatted images are provided for review. Automated exposure control, iterative reconstruction, and/or weight-based adjustment of the mA/kV was utilized to reduce the radiation dose to as low as reasonably achievable. COMPARISON: 07/27/2024 CLINICAL HISTORY: Stone burden eval, post treatment. FINDINGS: LOWER CHEST: Posterior bibasilar dependent atelectasis. LIVER: Diffuse hepatic steatosis. GALLBLADDER AND BILE DUCTS: Cholecystectomy. No biliary ductal dilatation. SPLEEN: No acute abnormality. PANCREAS:  No acute abnormality. ADRENAL GLANDS: No acute abnormality. KIDNEYS, URETERS AND BLADDER: Small nonobstructive bilateral calyceal calculi. On the left, there is periureteric stranding with mild hydroureter. Interval removal of the left double J ureteral stent. The urinary bladder is distended without focal abnormality. No hydronephrosis. No perinephric stranding. GI AND BOWEL: Stomach demonstrates no acute abnormality. There is no bowel obstruction. Normal appendix. PERITONEUM AND RETROPERITONEUM: No ascites. No free air. No free pelvic fluid. VASCULATURE: Aorta is normal in caliber. LYMPH NODES: No lymphadenopathy. REPRODUCTIVE ORGANS: The uterus and ovaries are within normal limits for age. BONES AND SOFT TISSUES: Scattered thoracic osteophytosis. No acute osseous  abnormality. No focal soft tissue abnormality. IMPRESSION: 1. Left periureteric stranding with mild hydroureter. This may represent mild residual inflammation related to the recent ureteral stent removal. No ureteral calculus. Correlation with urinalysis recommended to exclude an ascending urinary tract infection. 2. Small nonobstructive bilateral calyceal calculi. The largest calculus in the left kidney on the prior CT has mostly been broken up and only a thin sliver of calcification remains in this calyx. 3. Severe hepatic steatosis. Electronically signed by: Rogelia Myers MD MD 08/11/2024 10:53 AM EST RP Workstation: [id number]        Scheduled Meds:  FLUoxetine   40 mg Oral Daily   heparin   5,000 Units Subcutaneous Q8H   insulin  aspart  0-5 Units Subcutaneous QHS   insulin  aspart  0-9 Units Subcutaneous TID WC   methocarbamol   500 mg Oral TID   pantoprazole   40 mg Oral Daily   sodium chloride  flush  3 mL Intravenous Q12H   tamsulosin   0.4 mg Oral BID   Continuous Infusions:  ampicillin  (OMNIPEN) IV 2 g (08/11/24 1356)     LOS: 2 days       Calvin KATHEE Robson, MD [facility]   If 7PM-7AM, please contact night-coverage  08/11/2024, 3:16 PM   "

## 2024-08-11 NOTE — Progress Notes (Addendum)
 Urology Inpatient Progress Note  Subjective: No acute events overnight. She is afebrile, VSS. A.m. labs pending. She reports unchanged pain and some dysuria.  She remains on antibiotics as below.  Anti-infectives: Anti-infectives (From admission, onward)    Start     Dose/Rate Route Frequency Ordered Stop   08/09/24 1400  ampicillin  (OMNIPEN) 2 g in sodium chloride  0.9 % 100 mL IVPB        2 g 300 mL/hr over 20 Minutes Intravenous Every 6 hours 08/09/24 1318     08/09/24 0200  cefTRIAXone  (ROCEPHIN ) 1 g in sodium chloride  0.9 % 100 mL IVPB  Status:  Discontinued        1 g 200 mL/hr over 30 Minutes Intravenous Every 24 hours 08/09/24 0101 08/09/24 1214       Current Facility-Administered Medications  Medication Dose Route Frequency Provider Last Rate Last Admin   acetaminophen  (TYLENOL ) tablet 1,000 mg  1,000 mg Oral Q6H Khan, Ghalib, MD   1,000 mg at 08/11/24 9083   ALPRAZolam  (XANAX ) tablet 0.5 mg  0.5 mg Oral TID PRN Sreenath, Sudheer B, MD   0.5 mg at 08/11/24 0916   ampicillin  (OMNIPEN) 2 g in sodium chloride  0.9 % 100 mL IVPB  2 g Intravenous Q6H Lenon Elsie HERO, RPH 300 mL/hr at 08/11/24 0258 2 g at 08/11/24 0258   FLUoxetine  (PROZAC ) capsule 40 mg  40 mg Oral Daily Sreenath, Sudheer B, MD   40 mg at 08/11/24 0917   heparin  injection 5,000 Units  5,000 Units Subcutaneous Q8H Khan, Ghalib, MD   5,000 Units at 08/11/24 0511   HYDROmorphone  (DILAUDID ) injection 0.5 mg  0.5 mg Intravenous Q3H PRN Jhonny Sahara B, MD   0.5 mg at 08/11/24 9083   insulin  aspart (novoLOG ) injection 0-5 Units  0-5 Units Subcutaneous QHS Fernand Prost, MD       insulin  aspart (novoLOG ) injection 0-9 Units  0-9 Units Subcutaneous TID WC Khan, Ghalib, MD   1 Units at 08/09/24 1357   ondansetron  (ZOFRAN ) tablet 4 mg  4 mg Oral Q6H PRN Khan, Ghalib, MD   4 mg at 08/10/24 1810   Or   ondansetron  (ZOFRAN ) injection 4 mg  4 mg Intravenous Q6H PRN Khan, Ghalib, MD   4 mg at 08/10/24 9161   oxybutynin   (DITROPAN -XL) 24 hr tablet 15 mg  15 mg Oral Daily Sreenath, Sudheer B, MD   15 mg at 08/10/24 0835   oxyCODONE  (Oxy IR/ROXICODONE ) immediate release tablet 5-10 mg  5-10 mg Oral Q4H PRN Sreenath, Sudheer B, MD   10 mg at 08/11/24 0511   pantoprazole  (PROTONIX ) EC tablet 40 mg  40 mg Oral Daily Sreenath, Sudheer B, MD   40 mg at 08/11/24 0916   phenol (CHLORASEPTIC) mouth spray 1 spray  1 spray Mouth/Throat PRN Khan, Ghalib, MD   1 spray at 08/08/24 2208   senna-docusate (Senokot-S) tablet 1 tablet  1 tablet Oral QHS PRN Fernand Prost, MD       sodium chloride  flush (NS) 0.9 % injection 3 mL  3 mL Intravenous Q12H Khan, Ghalib, MD   3 mL at 08/10/24 2200   tamsulosin  (FLOMAX ) capsule 0.4 mg  0.4 mg Oral BID Jhonny Sahara B, MD   0.4 mg at 08/11/24 0917   Objective: Vital signs in last 24 hours: Temp:  [98 F (36.7 C)-98.3 F (36.8 C)] 98 F (36.7 C) (01/09 0740) Pulse Rate:  [85-99] 99 (01/09 0740) Resp:  [17-18] 18 (01/09 0740) BP: (112-115)/(72-85) 115/72 (  01/09 0740) SpO2:  [92 %-95 %] 93 % (01/09 0740) Weight:  [875 kg] 124 kg (01/09 0311)  Intake/Output from previous day: 01/08 0701 - 01/09 0700 In: 480 [P.O.:480] Out: -  Intake/Output this shift: No intake/output data recorded.  Physical Exam Vitals and nursing note reviewed.  Constitutional:      General: She is not in acute distress.    Appearance: She is not ill-appearing, toxic-appearing or diaphoretic.  HENT:     Head: Normocephalic and atraumatic.  Pulmonary:     Effort: Pulmonary effort is normal. No respiratory distress.  Skin:    General: Skin is warm and dry.  Neurological:     Mental Status: She is alert and oriented to person, place, and time.  Psychiatric:        Mood and Affect: Mood normal.        Behavior: Behavior normal.     Lab Results:  Recent Labs    08/09/24 0457  WBC 9.8  HGB 12.8  HCT 40.0  PLT 341   BMET Recent Labs    08/09/24 0457  NA 136  K 3.7  CL 100  CO2 24   GLUCOSE 91  BUN 8  CREATININE 0.74  CALCIUM 8.8*   Studies/Results: DG Chest Port 1 View Result Date: 08/09/2024 CLINICAL DATA:  Hypoxia. EXAM: PORTABLE CHEST 1 VIEW COMPARISON:  09/25/2022 FINDINGS: Lungs are hypoinflated and otherwise clear. Cardiomediastinal silhouette and remainder of the exam is unchanged. IMPRESSION: Hypoinflation without acute cardiopulmonary disease. Electronically Signed   By: Toribio Agreste M.D.   On: 08/09/2024 11:24   Assessment & Plan: 29 y.o. female with PMH left renal colic after premature ureteral stent dislodgment after undergoing left ureteroscopy with laser lithotripsy and stent exchange with Dr. Francisca 4 days ago.  She reports continued, unchanged left flank and LLQ pain.  Will obtain stat CT stone study this morning to rule out clinically significant stone fragment or persistent hydronephrosis.  I stopped her oxybutynin  orders.  Okay to continue antibiotics with reports of dysuria while we await CT results.  Recommendations: -CT stone study this morning, ordered - Oxybutynin  stopped - Continue antibiotics pending CT results - Pain control per primary team  ADDENDUM: CT shows mild left hydroureter with periureteral stranding consistent with her postop status.  Agree with continued antibiotics, again out of an abundance of caution, as well as anti-inflammatories.  CT findings certainly do not warrant a repeat stent placement.  Essica Kiker, PA-C 08/11/2024

## 2024-08-12 ENCOUNTER — Other Ambulatory Visit: Payer: Self-pay

## 2024-08-12 DIAGNOSIS — R52 Pain, unspecified: Secondary | ICD-10-CM | POA: Diagnosis not present

## 2024-08-12 LAB — GLUCOSE, CAPILLARY: Glucose-Capillary: 84 mg/dL (ref 70–99)

## 2024-08-12 MED ORDER — OXYCODONE HCL 5 MG PO TABS
5.0000 mg | ORAL_TABLET | Freq: Four times a day (QID) | ORAL | 0 refills | Status: AC | PRN
  Filled 2024-08-12: qty 30, 4d supply, fill #0

## 2024-08-12 MED ORDER — ONDANSETRON 4 MG PO TBDP
4.0000 mg | ORAL_TABLET | Freq: Three times a day (TID) | ORAL | 0 refills | Status: AC | PRN
  Filled 2024-08-12: qty 30, 10d supply, fill #0

## 2024-08-12 MED ORDER — METHOCARBAMOL 500 MG PO TABS
500.0000 mg | ORAL_TABLET | Freq: Three times a day (TID) | ORAL | 0 refills | Status: AC
  Filled 2024-08-12: qty 42, 14d supply, fill #0

## 2024-08-12 MED ORDER — AMOXICILLIN 500 MG PO CAPS
1000.0000 mg | ORAL_CAPSULE | Freq: Three times a day (TID) | ORAL | 0 refills | Status: AC
  Filled 2024-08-12: qty 42, 7d supply, fill #0

## 2024-08-12 NOTE — Discharge Summary (Signed)
 Physician Discharge Summary  Lori Henry FMW:[id number] DOB: [birth year] DOA: 08/08/2024  PCP: System, Provider Not In  Admit date: 08/08/2024 Discharge date: 08/12/2024  Admitted From: Home Disposition:  Home  Recommendations for Outpatient Follow-up:  Follow up with PCP in 1-2 weeks   Home Health: No  Equipment/Devices: None  Discharge Condition: Stable CODE STATUS: Full Diet recommendation: Carb modified  Brief/Interim Summary:   29 y.o. year old female with medical history of hypertension, class III obesity, nephrolithiasis status post stent placement presented to the ED with severe pain after having stent placed yesterday.  States initially she had polyuria and felt her stent dislodged and then had significant pain.  Pain was unbearable prompting her to come to the ED.  In the ED it was determined her stent has dislodged and EDP discussed with urologist who recommended removing the stent.  She denies any URI symptoms, denies any fevers or chills. On arrival to the ED patient was noted to be HDS stable.  Lab work and imaging are pending at time of admission.  Given uncontrolled pain and need for pain control, [facility] contacted for admission.       Discharge Diagnoses:  Principal Problem:   Intractable pain Active Problems:   Nephrolithiasis   Depression   Essential hypertension   PTSD (post-traumatic stress disorder)   Morbid obesity with BMI of 45.0-49.9, adult (HCC)  Intractable pain Recently dislodged ureteral stent Stent was removed by EDP but patient remained in significant pain.  Urology contacted from emergency room.  Pain control has been challenging.  Pain seemingly out of proportion to exam.  Repeat CT abdomen pelvis on 1/8 without evidence of hydronephrosis or retained stone fragments but did show mild left-sided hydroureter and ureteral inflammation consistent with recently dislodged stent Plan: The patient is stable for discharge home at this time.  Still  endorsing pain however out of proportion to exam.  Discussed at length with urology.  Repeat CT did show some left-sided ureteral inflammation consistent with recently dislodged stent.  Explained to patient at length that she may continue to have some pain for period of 7 to 10 days.  At this time she is stable for discharge home.  I have prescribed a multimodal pain regimen including muscle relaxants and narcotics.  I prescribed 8 additional 7-day course of ampicillin  for empiric treatment of urinary tract infection.  Flomax  0.4 mg twice daily has been resumed.  Oxybutynin  discontinued per urology.  I have encouraged patient to ambulate is much as possible which hopefully will promote some resolution of her pain.   Severe hepatic steatosis Noted incidentally on CAT scan 1/9.  This has been present on abdominal films dating back to 2018.  Patient has been advised on the presence of hepatic steatosis in the need for diet adjustment, weight loss, close monitoring.  On review of medical record there has been presence of hepatic steatosis since at least 2019 in our historical documentation.  I have advised patient that this could result in the development of cirrhosis at a young age should it continue to progress.   Discharge Instructions  Discharge Instructions     Increase activity slowly   Complete by: As directed       Allergies as of 08/12/2024       Reactions   Bee Venom Anaphylaxis, Itching   Other Reaction(s): eye redness, facial swelling        Medication List     STOP taking these medications    escitalopram   20 MG tablet Commonly known as: LEXAPRO    fluconazole  150 MG tablet Commonly known as: DIFLUCAN    ketorolac  10 MG tablet Commonly known as: TORADOL    morphine  15 MG tablet Commonly known as: MSIR   nitrofurantoin  100 MG capsule Commonly known as: MACRODANTIN    oxybutynin  15 MG 24 hr tablet Commonly known as: DITROPAN  XL       TAKE these medications     amoxicillin  500 MG capsule Commonly known as: AMOXIL  Take 2 capsules (1,000 mg total) by mouth every 8 (eight) hours for 7 days.   FLUoxetine  40 MG capsule Commonly known as: PROZAC  Take 40 mg by mouth daily.   lisinopril  20 MG tablet Commonly known as: ZESTRIL  Take 20 mg by mouth daily.   metFORMIN  500 MG 24 hr tablet Commonly known as: GLUCOPHAGE -XR Take 2 tablets (1,000 mg total) by mouth daily with breakfast.   methocarbamol  500 MG tablet Commonly known as: ROBAXIN  Take 1 tablet (500 mg total) by mouth 3 (three) times daily for 14 days.   Mounjaro 15 MG/0.5ML Pen Generic drug: tirzepatide Inject 15 mg into the skin once a week.   naproxen  500 MG tablet Commonly known as: NAPROSYN  Take 500 mg by mouth 2 (two) times daily.   norethindrone  0.35 MG tablet Commonly known as: Ortho Micronor  Take 1 tablet (0.35 mg total) by mouth daily.   nystatin  cream Commonly known as: MYCOSTATIN  To reddened skin under skin folds twice a day   omeprazole  20 MG capsule Commonly known as: PRILOSEC  Take 20 mg by mouth daily.   ondansetron  4 MG disintegrating tablet Commonly known as: ZOFRAN -ODT Take 1 tablet (4 mg total) by mouth every 8 (eight) hours as needed. What changed:  medication strength how much to take   oxyCODONE  5 MG immediate release tablet Commonly known as: Oxy IR/ROXICODONE  Take 1-2 tablets (5-10 mg total) by mouth every 6 (six) hours as needed for moderate pain (pain score 4-6) or severe pain (pain score 7-10).   polyethylene glycol 17 g packet Commonly known as: MIRALAX  / GLYCOLAX  Take 17 g by mouth daily as needed for mild constipation.   tamsulosin  0.4 MG Caps capsule Commonly known as: FLOMAX  Take 1 capsule (0.4 mg total) by mouth in the morning and at bedtime.        Allergies[1]  Consultations: Urology   Procedures/Studies: CT RENAL STONE STUDY Result Date: 08/11/2024 EXAM: CT ABDOMEN AND PELVIS WITHOUT CONTRAST 08/11/2024 09:54:00 AM  TECHNIQUE: CT of the abdomen and pelvis was performed without the administration of intravenous contrast. Multiplanar reformatted images are provided for review. Automated exposure control, iterative reconstruction, and/or weight-based adjustment of the mA/kV was utilized to reduce the radiation dose to as low as reasonably achievable. COMPARISON: 07/27/2024 CLINICAL HISTORY: Stone burden eval, post treatment. FINDINGS: LOWER CHEST: Posterior bibasilar dependent atelectasis. LIVER: Diffuse hepatic steatosis. GALLBLADDER AND BILE DUCTS: Cholecystectomy. No biliary ductal dilatation. SPLEEN: No acute abnormality. PANCREAS: No acute abnormality. ADRENAL GLANDS: No acute abnormality. KIDNEYS, URETERS AND BLADDER: Small nonobstructive bilateral calyceal calculi. On the left, there is periureteric stranding with mild hydroureter. Interval removal of the left double J ureteral stent. The urinary bladder is distended without focal abnormality. No hydronephrosis. No perinephric stranding. GI AND BOWEL: Stomach demonstrates no acute abnormality. There is no bowel obstruction. Normal appendix. PERITONEUM AND RETROPERITONEUM: No ascites. No free air. No free pelvic fluid. VASCULATURE: Aorta is normal in caliber. LYMPH NODES: No lymphadenopathy. REPRODUCTIVE ORGANS: The uterus and ovaries are within normal limits for age. BONES AND  SOFT TISSUES: Scattered thoracic osteophytosis. No acute osseous abnormality. No focal soft tissue abnormality. IMPRESSION: 1. Left periureteric stranding with mild hydroureter. This may represent mild residual inflammation related to the recent ureteral stent removal. No ureteral calculus. Correlation with urinalysis recommended to exclude an ascending urinary tract infection. 2. Small nonobstructive bilateral calyceal calculi. The largest calculus in the left kidney on the prior CT has mostly been broken up and only a thin sliver of calcification remains in this calyx. 3. Severe hepatic steatosis.  Electronically signed by: Rogelia Myers MD MD 08/11/2024 10:53 AM EST RP Workstation: [email or web address]   DG Chest Port 1 View Result Date: 08/09/2024 CLINICAL DATA:  Hypoxia. EXAM: PORTABLE CHEST 1 VIEW COMPARISON:  09/25/2022 FINDINGS: Lungs are hypoinflated and otherwise clear. Cardiomediastinal silhouette and remainder of the exam is unchanged. IMPRESSION: Hypoinflation without acute cardiopulmonary disease. Electronically Signed   By: Toribio Agreste M.D.   On: 08/09/2024 11:24   DG Abd Portable 1 View Result Date: 08/08/2024 EXAM: 1 VIEW XRAY OF THE ABDOMEN 08/08/2024 03:52:00 PM COMPARISON: None available. CLINICAL HISTORY: ureteral stent partially removed FINDINGS: LINES, TUBES AND DEVICES: Left ureteral stent in place. Proximal catheter tip projects over the pelvis, likely in the region of the mid/distal left ureter. The inferior portion of the catheter projects midline outside of the body near the perineum. BOWEL: No evidence for bowel obstruction. SOFT TISSUES: Cholecystectomy clips are present. BONES: No acute fracture. IMPRESSION: 1. Left ureteral stent in place, with the proximal tip likely in the region of the mid to distal left ureter and the inferior portion projecting midline outside of the body near the perineum. Electronically signed by: Greig Pique MD 08/08/2024 04:32 PM EST RP Workstation: [id number]   DG OR UROLOGY CYSTO IMAGE ([facility] ONLY) Result Date: 08/07/2024 There is no interpretation for this exam.  This order is for images obtained during a surgical procedure.  Please See Surgeries Tab for more information regarding the procedure.   CT ABDOMEN PELVIS WO CONTRAST Result Date: 07/27/2024 CLINICAL DATA:  Left lower quadrant abdominal pain. EXAM: CT ABDOMEN AND PELVIS WITHOUT CONTRAST TECHNIQUE: Multidetector CT imaging of the abdomen and pelvis was performed following the standard protocol without IV contrast. RADIATION DOSE REDUCTION: This exam was performed according to the  departmental dose-optimization program which includes automated exposure control, adjustment of the mA and/or kV according to patient size and/or use of iterative reconstruction technique. COMPARISON:  July 22, 2024 FINDINGS: Lower chest: No acute abnormality. Hepatobiliary: There is diffuse fatty infiltration of the liver parenchyma. No focal liver abnormality is seen. Status post cholecystectomy. No biliary dilatation. Pancreas: Unremarkable. No pancreatic ductal dilatation or surrounding inflammatory changes. Spleen: Normal in size without focal abnormality. Adrenals/Urinary Tract: Adrenal glands are unremarkable. Kidneys are normal in size, without focal lesions. A properly positioned left-sided endo ureteral stent is seen. Two 6 mm proximal ureteral calculi are also noted. A 2 mm nonobstructing renal calculus is seen within the anterior aspect of the mid right kidney. 2 mm, 3 mm and 10 mm nonobstructing renal calculi are noted within the left kidney. Bladder is unremarkable. Stomach/Bowel: Stomach is within normal limits. Appendix appears normal. No evidence of bowel wall thickening, distention, or inflammatory changes. Vascular/Lymphatic: No significant vascular findings are present. No enlarged abdominal or pelvic lymph nodes. Reproductive: Uterus and bilateral adnexa are unremarkable. Other: No abdominal wall hernia or abnormality. No abdominopelvic ascites. Musculoskeletal: No acute or significant osseous findings. IMPRESSION: 1. Properly positioned left-sided endo ureteral stent with two 6 mm proximal  left ureteral calculi. 2. Bilateral nonobstructing renal calculi. 3. Hepatic steatosis. 4. Evidence of prior cholecystectomy. Electronically Signed   By: Suzen Dials M.D.   On: 07/27/2024 18:56   DG OR UROLOGY CYSTO IMAGE ([facility] ONLY) Result Date: 07/22/2024 There is no interpretation for this exam.  This order is for images obtained during a surgical procedure.  Please See Surgeries Tab for  more information regarding the procedure.   CT ABDOMEN PELVIS W CONTRAST Result Date: 07/22/2024 EXAM: CT ABDOMEN AND PELVIS WITH CONTRAST 07/22/2024 09:29:56 AM TECHNIQUE: CT of the abdomen and pelvis was performed with the administration of 100 mL of iohexol  (OMNIPAQUE ) 300 MG/ML solution. Multiplanar reformatted images are provided for review. Automated exposure control, iterative reconstruction, and/or weight-based adjustment of the mA/kV was utilized to reduce the radiation dose to as low as reasonably achievable. COMPARISON: 01/29/2019 CLINICAL HISTORY: LLQ abdominal pain; diarrhea. FINDINGS: LOWER CHEST: No acute abnormality. LIVER: Hepatic steatosis. GALLBLADDER AND BILE DUCTS: Status post cholecystectomy. No biliary ductal dilatation. SPLEEN: No acute abnormality. PANCREAS: No acute abnormality. ADRENAL GLANDS: No acute abnormality. KIDNEYS, URETERS AND BLADDER: Bilateral nephrolithiasis. The largest stone is in the upper pole collecting system of the left kidney measuring 1.2 cm (image 39/2). Asymmetric perinephric fat stranding, edema, and hydronephrosis are noted involving the left kidney. Calcification within the central renal pelvis measures 3 mm. There are at least 3 stacked stones within the proximal left ureter, the largest measures 7 mm (coronal image 63/5). No perinephric or periureteral stranding is noted involving the right kidney or ureter. Urinary bladder is unremarkable. GI AND BOWEL: Stomach demonstrates no acute abnormality. The appendix is visualized and appears normal. There is no bowel obstruction. PERITONEUM AND RETROPERITONEUM: No ascites. No free air. VASCULATURE: Aorta is normal in caliber. LYMPH NODES: No lymphadenopathy. REPRODUCTIVE ORGANS: The uterus appears normal. No adnexal mass. BONES AND SOFT TISSUES: No acute or suspicious osseous findings. No focal soft tissue abnormality. IMPRESSION: 1. At least 3 stacked stones within the proximal left ureter, the largest measuring 7  mm, with associated left hydronephrosis and perinephric fat stranding/edema. 2. Bilateral nephrolithiasis with the largest stone in the upper pole collecting system of the left kidney measuring 1.2 cm. Electronically signed by: Waddell Calk MD 07/22/2024 09:37 AM EST RP Workstation: [id number]      Subjective: Seen and examined on the day of discharge.  Stable no distress.  [place] for discharge home.  Discharge Exam: Vitals:   08/12/24 0358 08/12/24 0740  BP: (!) 137/95 127/68  Pulse: 84 92  Resp: 20 17  Temp: 98.1 F (36.7 C) 98.4 F (36.9 C)  SpO2: 97% 97%   Vitals:   08/11/24 2057 08/12/24 0358 08/12/24 0500 08/12/24 0740  BP: (!) 144/101 (!) 137/95  127/68  Pulse: 89 84  92  Resp: 18 20  17   Temp: 98.7 F (37.1 C) 98.1 F (36.7 C)  98.4 F (36.9 C)  TempSrc:      SpO2: 93% 97%  97%  Weight:   128.4 kg   Height:        General: Pt is alert, awake, not in acute distress Cardiovascular: RRR, S1/S2 +, no rubs, no gallops Respiratory: CTA bilaterally, no wheezing, no rhonchi Abdominal: Soft, NT, ND, bowel sounds + Extremities: no edema, no cyanosis    The results of significant diagnostics from this hospitalization (including imaging, microbiology, ancillary and laboratory) are listed below for reference.     Microbiology: Recent Results (from the past 240 hours)  Urine Culture (for  pregnant, neutropenic or urologic patients or patients with an indwelling urinary catheter)     Status: None   Collection Time: 08/09/24 12:01 AM   Specimen: Urine, Clean Catch  Result Value Ref Range Status   Specimen Description   Final    URINE, CLEAN CATCH Performed at [facility], [address] [place], [place] [address]    Special Requests   Final    NONE Performed at [facility], [address], [place], [place] [address]    Culture   Final    NO GROWTH Performed at [facility], [address] [place], [place] [address]    Report Status  08/10/2024 FINAL  Final     Labs: BNP (last 3 results) No results for input(s): BNP in the last 8760 hours. Basic Metabolic Panel: Recent Labs  Lab 08/08/24 2021 08/09/24 0457 08/11/24 1436  NA  --  136 139  K  --  3.7 3.7  CL  --  100 104  CO2  --  24 27  GLUCOSE  --  91 121*  BUN  --  8 9  CREATININE  --  0.74 0.72  CALCIUM  --  8.8* 8.5*  MG 1.9  --   --    Liver Function Tests: No results for input(s): AST, ALT, ALKPHOS, BILITOT, PROT, ALBUMIN in the last 168 hours. No results for input(s): LIPASE, AMYLASE in the last 168 hours. No results for input(s): AMMONIA  in the last 168 hours. CBC: Recent Labs  Lab 08/09/24 0457 08/11/24 1436  WBC 9.8 6.1  NEUTROABS  --  3.0  HGB 12.8 10.5*  HCT 40.0 32.8*  MCV 87.5 88.4  PLT 341 288   Cardiac Enzymes: No results for input(s): CKTOTAL, CKMB, CKMBINDEX, TROPONINI in the last 168 hours. BNP: Invalid input(s): POCBNP CBG: Recent Labs  Lab 08/11/24 0741 08/11/24 1116 08/11/24 1602 08/11/24 2059 08/12/24 0742  GLUCAP 91 103* 112* 108* 84   D-Dimer No results for input(s): DDIMER in the last 72 hours. Hgb A1c No results for input(s): HGBA1C in the last 72 hours. Lipid Profile No results for input(s): CHOL, HDL, LDLCALC, TRIG, CHOLHDL, LDLDIRECT in the last 72 hours. Thyroid function studies No results for input(s): TSH, T4TOTAL, T3FREE, THYROIDAB in the last 72 hours.  Invalid input(s): FREET3 Anemia work up No results for input(s): VITAMINB12, FOLATE, FERRITIN, TIBC, IRON, RETICCTPCT in the last 72 hours. Urinalysis    Component Value Date/Time   COLORURINE YELLOW (A) 08/08/2024 2350   APPEARANCEUR CLOUDY (A) 08/08/2024 2350   APPEARANCEUR Slightly cloudy 08/01/2024 1059   LABSPEC 1.009 08/08/2024 2350   LABSPEC 1.009 02/04/2012 2337   PHURINE 6.0 08/08/2024 2350   GLUCOSEU NEGATIVE 08/08/2024 2350   GLUCOSEU Negative 02/04/2012 2337    HGBUR LARGE (A) 08/08/2024 2350   BILIRUBINUR NEGATIVE 08/08/2024 2350   BILIRUBINUR Negative 08/01/2024 1059   BILIRUBINUR Negative 02/04/2012 2337   KETONESUR NEGATIVE 08/08/2024 2350   PROTEINUR 100 (A) 08/08/2024 2350   UROBILINOGEN negative (A) 12/09/2021 1627   NITRITE NEGATIVE 08/08/2024 2350   LEUKOCYTESUR MODERATE (A) 08/08/2024 2350   LEUKOCYTESUR Negative 02/04/2012 2337   Sepsis Labs Recent Labs  Lab 08/09/24 0457 08/11/24 1436  WBC 9.8 6.1   Microbiology Recent Results (from the past 240 hours)  Urine Culture (for pregnant, neutropenic or urologic patients or patients with an indwelling urinary catheter)     Status: None   Collection Time: 08/09/24 12:01 AM   Specimen: Urine, Clean  Catch  Result Value Ref Range Status   Specimen Description   Final    URINE, CLEAN CATCH Performed at [facility], [address], [place], [place] [address]    Special Requests   Final    NONE Performed at [facility], [address], [place], [place] [address]    Culture   Final    NO GROWTH Performed at [facility], [address], [place], [place] [address]    Report Status 08/10/2024 FINAL  Final     Time coordinating discharge: 40 minutes  SIGNED:   Calvin KATHEE Robson, MD  [facility] 08/12/2024, 10:59 AM Pager   If 7PM-7AM, please contact night-coverage     [1]  Allergies Allergen Reactions   Bee Venom Anaphylaxis and Itching    Other Reaction(s): eye redness, facial swelling

## 2024-08-12 NOTE — Plan of Care (Signed)

## 2024-08-15 ENCOUNTER — Emergency Department
Admission: EM | Admit: 2024-08-15 | Discharge: 2024-08-15 | Disposition: A | Discharge status: Home / Self Care | Attending: Emergency Medicine | Admitting: Emergency Medicine

## 2024-08-15 ENCOUNTER — Other Ambulatory Visit: Payer: Self-pay

## 2024-08-15 ENCOUNTER — Encounter: Payer: Self-pay | Admitting: Emergency Medicine

## 2024-08-15 DIAGNOSIS — T23122A Burn of first degree of single left finger (nail) except thumb, initial encounter: Secondary | ICD-10-CM | POA: Insufficient documentation

## 2024-08-15 DIAGNOSIS — T300 Burn of unspecified body region, unspecified degree: Secondary | ICD-10-CM

## 2024-08-15 DIAGNOSIS — X19XXXA Contact with other heat and hot substances, initial encounter: Secondary | ICD-10-CM | POA: Insufficient documentation

## 2024-08-15 DIAGNOSIS — T310 Burns involving less than 10% of body surface: Secondary | ICD-10-CM | POA: Diagnosis not present

## 2024-08-15 DIAGNOSIS — I808 Phlebitis and thrombophlebitis of other sites: Secondary | ICD-10-CM | POA: Diagnosis not present

## 2024-08-15 NOTE — ED Triage Notes (Signed)
 Patient ambulatory to triage with steady gait, without difficulty or distress noted; pt reports PTA she burned her left index finger getting a pan out of the over--no redness noted, skin intact; pt also reports that her left FA is tender since having an IV there--no redness or swelling noted

## 2024-08-15 NOTE — ED Provider Notes (Signed)
 "  [facility] Provider Note    Event Date/Time   First MD Initiated Contact with Patient 08/15/24 0308     (approximate)   History   Burn   HPI  Lori Henry is a 29 y.o. female who presents to the ED for evaluation of Burn   Patient presents to the ED due to accidental burn on her left second finger.  Hand slipped off of the hot pad as she was pulling something out of the oven and her finger touched a hot pan.  Also reports an area of discomfort to her left dorsal forearm IV insertion site from last week when she was admitted for pain control related to a ureteral stone   Physical Exam   Triage Vital Signs: ED Triage Vitals  Encounter Vitals Group     BP 08/15/24 0136 (!) 148/99     Girls Systolic BP Percentile --      Girls Diastolic BP Percentile --      Boys Systolic BP Percentile --      Boys Diastolic BP Percentile --      Pulse Rate 08/15/24 0136 79     Resp 08/15/24 0136 18     Temp 08/15/24 0136 (!) 97.5 F (36.4 C)     Temp Source 08/15/24 0136 Oral     SpO2 08/15/24 0136 98 %     Weight 08/15/24 0139 264 lb (119.7 kg)     Height 08/15/24 0139 5' 3 (1.6 m)     Head Circumference --      Peak Flow --      Pain Score 08/15/24 0139 8     Pain Loc --      Pain Education --      Exclude from Growth Chart --     Most recent vital signs: Vitals:   08/15/24 0136  BP: (!) 148/99  Pulse: 79  Resp: 18  Temp: (!) 97.5 F (36.4 C)  SpO2: 98%    General: Awake, no distress.  CV:  Good peripheral perfusion.  Resp:  Normal effort.  Abd:  No distention.  MSK:  No deformity noted.  IV insertion site noted to left dorsal forearm without surrounding infectious features or deformity Neuro:  No focal deficits appreciated. Other:  Tiny superficial burn to the left index finger, proximal phalanx, obliquely oriented, 1 cm in length, 2 mm wide.  Full active range of motion of this finger   ED Results / Procedures / Treatments    Labs (all labs ordered are listed, but only abnormal results are displayed) Labs Reviewed - No data to display  EKG   RADIOLOGY   Official radiology report(s): No results found.  PROCEDURES and INTERVENTIONS:  Procedures  Medications - No data to display   IMPRESSION / MDM / ASSESSMENT AND PLAN / ED COURSE  I reviewed the triage vital signs and the nursing notes.  Differential diagnosis includes, but is not limited to, superficial burn, circumferential burn, partial-thickness burn, DVT, superficial thrombophlebitis  {Patient presents with symptoms of an acute illness or injury that is potentially life-threatening.  Patient presents with a tiny superficial burn suitable for outpatient management.  Likely superficial thrombophlebitis to her left forearm, doubt DVT, cellulitis or more severe pathology      FINAL CLINICAL IMPRESSION(S) / ED DIAGNOSES   Final diagnoses:  Superficial thrombophlebitis of left upper extremity  Superficial burn     Rx / DC Orders   ED Discharge Orders  None        Note:  This document was prepared using Dragon voice recognition software and may include unintentional dictation errors.   Claudene Rover, MD 08/15/24 [phone]  "

## 2024-08-15 NOTE — Discharge Instructions (Addendum)
Please take Tylenol and ibuprofen/Advil for your pain.  It is safe to take them together, or to alternate them every few hours.  Take up to 1000mg of Tylenol at a time, up to 4 times per day.  Do not take more than 4000 mg of Tylenol in 24 hours.  For ibuprofen, take 400-600 mg, 3 - 4 times per day.  

## 2024-09-03 ENCOUNTER — Emergency Department
Admission: EM | Admit: 2024-09-03 | Discharge: 2024-09-03 | Disposition: A | Discharge status: Home / Self Care | Payer: Self-pay | Attending: Emergency Medicine | Admitting: Emergency Medicine

## 2024-09-03 ENCOUNTER — Other Ambulatory Visit: Payer: Self-pay

## 2024-09-03 DIAGNOSIS — N644 Mastodynia: Secondary | ICD-10-CM | POA: Insufficient documentation

## 2024-09-03 LAB — POC URINE PREG, ED: Preg Test, Ur: NEGATIVE

## 2024-09-03 NOTE — ED Triage Notes (Signed)
 Pt to ed from home via POV for nipple pain. Pt is caox4, in no acute distress and ambulatory to triage. Pt also has some drainage from her nipple. But has been intermittently doing that since last pregnancy. Pt has pain in both breast.

## 2024-09-03 NOTE — ED Provider Notes (Signed)
" ° °  [facility] Provider Note    Event Date/Time   First MD Initiated Contact with Patient 09/03/24 2006     (approximate)   History   Breast Problem   HPI  Lori Henry is a 29 y.o. female with no significant past medical history who presents with complaints of nipple soreness which has been present for about 4 days.  She denies injury to the area.  No fevers.  No redness.     Physical Exam   Triage Vital Signs: ED Triage Vitals [09/03/24 1844]  Encounter Vitals Group     BP (!) 144/90     Girls Systolic BP Percentile      Girls Diastolic BP Percentile      Boys Systolic BP Percentile      Boys Diastolic BP Percentile      Pulse Rate 100     Resp 16     Temp 98.7 F (37.1 C)     Temp Source Oral     SpO2 95 %     Weight      Height 1.6 m (5' 3)     Head Circumference      Peak Flow      Pain Score 10     Pain Loc      Pain Education      Exclude from Growth Chart     Most recent vital signs: Vitals:   09/03/24 1844  BP: (!) 144/90  Pulse: 100  Resp: 16  Temp: 98.7 F (37.1 C)  SpO2: 95%     General: Awake, no distress.  CV:  Good peripheral perfusion.  Resp:  Normal effort.  Abd:  No distention.  Other:  Breast exam: Rudell RN chaperoned Normal breast exam, no palpable masses, no discharge, no redness, no signs of mastitis, no significant tender to the nipples  ED Results / Procedures / Treatments   Labs (all labs ordered are listed, but only abnormal results are displayed) Labs Reviewed  POC URINE PREG, ED     EKG     RADIOLOGY     PROCEDURES:  Critical Care performed:   Procedures   MEDICATIONS ORDERED IN ED: Medications - No data to display   IMPRESSION / MDM / ASSESSMENT AND PLAN / ED COURSE  I reviewed the triage vital signs and the nursing notes. Patient's presentation is most consistent with acute, uncomplicated illness.  Unclear cause of sore nipples, recommend follow-up with PCP  for further evaluation        FINAL CLINICAL IMPRESSION(S) / ED DIAGNOSES   Final diagnoses:  Nipple soreness     Rx / DC Orders   ED Discharge Orders     None        Note:  This document was prepared using Dragon voice recognition software and may include unintentional dictation errors.   Arlander Charleston, MD 09/03/24 2037  "

## 2024-10-18 ENCOUNTER — Ambulatory Visit: Admitting: Physician Assistant
